# Patient Record
Sex: Female | Born: 1945 | Race: White | Hispanic: No | Marital: Married | Smoking: Former smoker
Health system: Southern US, Community
[De-identification: ages and names within clinical notes are randomized; demographics above are authoritative.]

## PROBLEM LIST (undated history)

## (undated) DIAGNOSIS — M1990 Unspecified osteoarthritis, unspecified site: Secondary | ICD-10-CM

## (undated) DIAGNOSIS — I10 Essential (primary) hypertension: Secondary | ICD-10-CM

## (undated) DIAGNOSIS — G629 Polyneuropathy, unspecified: Secondary | ICD-10-CM

## (undated) DIAGNOSIS — I509 Heart failure, unspecified: Secondary | ICD-10-CM

## (undated) DIAGNOSIS — N289 Disorder of kidney and ureter, unspecified: Secondary | ICD-10-CM

## (undated) DIAGNOSIS — E079 Disorder of thyroid, unspecified: Secondary | ICD-10-CM

## (undated) DIAGNOSIS — D649 Anemia, unspecified: Secondary | ICD-10-CM

## (undated) DIAGNOSIS — H3530 Unspecified macular degeneration: Secondary | ICD-10-CM

## (undated) DIAGNOSIS — R9439 Abnormal result of other cardiovascular function study: Secondary | ICD-10-CM

## (undated) DIAGNOSIS — E785 Hyperlipidemia, unspecified: Secondary | ICD-10-CM

## (undated) DIAGNOSIS — E119 Type 2 diabetes mellitus without complications: Secondary | ICD-10-CM

## (undated) DIAGNOSIS — G4730 Sleep apnea, unspecified: Secondary | ICD-10-CM

## (undated) DIAGNOSIS — T884XXA Failed or difficult intubation, initial encounter: Secondary | ICD-10-CM

## (undated) DIAGNOSIS — E669 Obesity, unspecified: Secondary | ICD-10-CM

## (undated) DIAGNOSIS — J449 Chronic obstructive pulmonary disease, unspecified: Secondary | ICD-10-CM

## (undated) DIAGNOSIS — R079 Chest pain, unspecified: Secondary | ICD-10-CM

## (undated) DIAGNOSIS — I671 Cerebral aneurysm, nonruptured: Secondary | ICD-10-CM

## (undated) DIAGNOSIS — E46 Unspecified protein-calorie malnutrition: Secondary | ICD-10-CM

## (undated) DIAGNOSIS — Z9889 Other specified postprocedural states: Secondary | ICD-10-CM

## (undated) HISTORY — PX: CHOLECYSTECTOMY: SHX55

## (undated) HISTORY — DX: Other specified postprocedural states: Z98.890

## (undated) HISTORY — PX: TONSILLECTOMY: SUR1361

## (undated) HISTORY — DX: Unspecified macular degeneration: H35.30

## (undated) HISTORY — DX: Hyperlipidemia, unspecified: E78.5

## (undated) HISTORY — PX: OTHER SURGICAL HISTORY: SHX169

## (undated) HISTORY — DX: Cerebral aneurysm, nonruptured: I67.1

## (undated) HISTORY — DX: Obesity, unspecified: E66.9

## (undated) HISTORY — DX: Essential (primary) hypertension: I10

## (undated) HISTORY — PX: APPENDECTOMY: SHX54

## (undated) HISTORY — DX: Unspecified osteoarthritis, unspecified site: M19.90

## (undated) HISTORY — DX: Anemia, unspecified: D64.9

## (undated) HISTORY — DX: Abnormal result of other cardiovascular function study: R94.39

## (undated) HISTORY — DX: Disorder of thyroid, unspecified: E07.9

## (undated) HISTORY — DX: Heart failure, unspecified: I50.9

## (undated) HISTORY — DX: Chest pain, unspecified: R07.9

## (undated) HISTORY — DX: Polyneuropathy, unspecified: G62.9

## (undated) HISTORY — PX: CEREBRAL ANEURYSM REPAIR: SHX164

---

## 1968-06-02 HISTORY — PX: BACK SURGERY: SHX140

## 1998-11-21 ENCOUNTER — Inpatient Hospital Stay (HOSPITAL_COMMUNITY): Admission: AD | Admit: 1998-11-21 | Discharge: 1998-11-23 | Payer: Self-pay | Admitting: Internal Medicine

## 2000-02-26 ENCOUNTER — Ambulatory Visit (HOSPITAL_BASED_OUTPATIENT_CLINIC_OR_DEPARTMENT_OTHER): Admission: RE | Admit: 2000-02-26 | Discharge: 2000-02-26 | Payer: Self-pay | Admitting: *Deleted

## 2000-11-26 ENCOUNTER — Emergency Department (HOSPITAL_COMMUNITY): Admission: EM | Admit: 2000-11-26 | Discharge: 2000-11-26 | Payer: Self-pay | Admitting: Internal Medicine

## 2000-11-26 ENCOUNTER — Encounter: Payer: Self-pay | Admitting: *Deleted

## 2001-05-20 ENCOUNTER — Encounter: Payer: Self-pay | Admitting: General Surgery

## 2001-05-20 ENCOUNTER — Ambulatory Visit (HOSPITAL_COMMUNITY): Admission: RE | Admit: 2001-05-20 | Discharge: 2001-05-20 | Payer: Self-pay | Admitting: General Surgery

## 2002-06-02 DIAGNOSIS — I671 Cerebral aneurysm, nonruptured: Secondary | ICD-10-CM

## 2002-06-02 HISTORY — DX: Cerebral aneurysm, nonruptured: I67.1

## 2002-06-02 HISTORY — PX: CARDIAC CATHETERIZATION: SHX172

## 2002-07-19 ENCOUNTER — Encounter: Payer: Self-pay | Admitting: Internal Medicine

## 2002-07-19 ENCOUNTER — Inpatient Hospital Stay (HOSPITAL_COMMUNITY): Admission: EM | Admit: 2002-07-19 | Discharge: 2002-07-28 | Payer: Self-pay | Admitting: Emergency Medicine

## 2002-08-16 ENCOUNTER — Encounter: Admission: RE | Admit: 2002-08-16 | Discharge: 2002-08-16 | Payer: Self-pay | Admitting: Neurosurgery

## 2002-09-29 ENCOUNTER — Encounter: Payer: Self-pay | Admitting: Family Medicine

## 2002-09-29 ENCOUNTER — Ambulatory Visit (HOSPITAL_COMMUNITY): Admission: RE | Admit: 2002-09-29 | Discharge: 2002-09-29 | Payer: Self-pay | Admitting: Family Medicine

## 2002-11-14 ENCOUNTER — Encounter: Payer: Self-pay | Admitting: *Deleted

## 2002-11-14 ENCOUNTER — Inpatient Hospital Stay (HOSPITAL_COMMUNITY): Admission: EM | Admit: 2002-11-14 | Discharge: 2002-11-17 | Payer: Self-pay | Admitting: *Deleted

## 2002-11-15 ENCOUNTER — Encounter: Payer: Self-pay | Admitting: *Deleted

## 2002-11-16 HISTORY — PX: CARDIAC CATHETERIZATION: SHX172

## 2002-12-05 ENCOUNTER — Emergency Department (HOSPITAL_COMMUNITY): Admission: EM | Admit: 2002-12-05 | Discharge: 2002-12-05 | Payer: Self-pay | Admitting: Emergency Medicine

## 2003-02-09 ENCOUNTER — Encounter: Payer: Self-pay | Admitting: Podiatry

## 2003-02-13 ENCOUNTER — Ambulatory Visit (HOSPITAL_COMMUNITY): Admission: RE | Admit: 2003-02-13 | Discharge: 2003-02-13 | Payer: Self-pay | Admitting: Podiatry

## 2003-03-30 ENCOUNTER — Ambulatory Visit (HOSPITAL_COMMUNITY): Admission: RE | Admit: 2003-03-30 | Discharge: 2003-03-31 | Payer: Self-pay | Admitting: Cardiovascular Disease

## 2003-04-17 ENCOUNTER — Ambulatory Visit (HOSPITAL_COMMUNITY): Admission: RE | Admit: 2003-04-17 | Discharge: 2003-04-17 | Payer: Self-pay | Admitting: Cardiovascular Disease

## 2003-05-08 ENCOUNTER — Ambulatory Visit (HOSPITAL_COMMUNITY): Admission: RE | Admit: 2003-05-08 | Discharge: 2003-05-09 | Payer: Self-pay | Admitting: Cardiovascular Disease

## 2004-01-04 ENCOUNTER — Ambulatory Visit (HOSPITAL_COMMUNITY): Admission: RE | Admit: 2004-01-04 | Discharge: 2004-01-04 | Payer: Self-pay | Admitting: Internal Medicine

## 2004-01-05 ENCOUNTER — Ambulatory Visit (HOSPITAL_COMMUNITY): Admission: RE | Admit: 2004-01-05 | Discharge: 2004-01-05 | Payer: Self-pay | Admitting: Interventional Radiology

## 2004-01-30 ENCOUNTER — Ambulatory Visit (HOSPITAL_COMMUNITY): Admission: RE | Admit: 2004-01-30 | Discharge: 2004-01-30 | Payer: Self-pay | Admitting: Internal Medicine

## 2004-03-04 ENCOUNTER — Ambulatory Visit (HOSPITAL_COMMUNITY): Admission: RE | Admit: 2004-03-04 | Discharge: 2004-03-04 | Payer: Self-pay | Admitting: General Surgery

## 2005-01-13 ENCOUNTER — Ambulatory Visit (HOSPITAL_COMMUNITY): Admission: RE | Admit: 2005-01-13 | Discharge: 2005-01-13 | Payer: Self-pay | Admitting: Interventional Radiology

## 2005-03-03 ENCOUNTER — Ambulatory Visit (HOSPITAL_COMMUNITY): Admission: RE | Admit: 2005-03-03 | Discharge: 2005-03-03 | Payer: Self-pay | Admitting: Internal Medicine

## 2005-06-02 HISTORY — PX: ESOPHAGOGASTRODUODENOSCOPY: SHX1529

## 2005-07-04 ENCOUNTER — Ambulatory Visit (HOSPITAL_COMMUNITY): Admission: RE | Admit: 2005-07-04 | Discharge: 2005-07-04 | Payer: Self-pay | Admitting: Interventional Radiology

## 2005-08-27 ENCOUNTER — Ambulatory Visit (HOSPITAL_COMMUNITY): Admission: RE | Admit: 2005-08-27 | Discharge: 2005-08-27 | Payer: Self-pay | Admitting: Family Medicine

## 2005-10-20 ENCOUNTER — Emergency Department (HOSPITAL_COMMUNITY): Admission: EM | Admit: 2005-10-20 | Discharge: 2005-10-20 | Payer: Self-pay | Admitting: Emergency Medicine

## 2005-11-27 ENCOUNTER — Ambulatory Visit: Payer: Self-pay | Admitting: Internal Medicine

## 2005-12-16 ENCOUNTER — Ambulatory Visit (HOSPITAL_COMMUNITY): Admission: RE | Admit: 2005-12-16 | Discharge: 2005-12-16 | Payer: Self-pay | Admitting: Internal Medicine

## 2005-12-16 ENCOUNTER — Ambulatory Visit: Payer: Self-pay | Admitting: Internal Medicine

## 2005-12-16 ENCOUNTER — Encounter (INDEPENDENT_AMBULATORY_CARE_PROVIDER_SITE_OTHER): Payer: Self-pay | Admitting: *Deleted

## 2006-03-04 ENCOUNTER — Ambulatory Visit (HOSPITAL_COMMUNITY): Admission: RE | Admit: 2006-03-04 | Discharge: 2006-03-04 | Payer: Self-pay | Admitting: Internal Medicine

## 2006-04-17 ENCOUNTER — Ambulatory Visit (HOSPITAL_COMMUNITY): Admission: RE | Admit: 2006-04-17 | Discharge: 2006-04-17 | Payer: Self-pay | Admitting: Interventional Radiology

## 2006-05-04 ENCOUNTER — Ambulatory Visit (HOSPITAL_COMMUNITY): Admission: RE | Admit: 2006-05-04 | Discharge: 2006-05-04 | Payer: Self-pay | Admitting: Cardiovascular Disease

## 2006-05-07 ENCOUNTER — Ambulatory Visit (HOSPITAL_COMMUNITY): Admission: RE | Admit: 2006-05-07 | Discharge: 2006-05-07 | Payer: Self-pay | Admitting: Cardiology

## 2006-05-07 HISTORY — PX: OTHER SURGICAL HISTORY: SHX169

## 2007-04-02 ENCOUNTER — Ambulatory Visit (HOSPITAL_COMMUNITY): Admission: RE | Admit: 2007-04-02 | Discharge: 2007-04-02 | Payer: Self-pay | Admitting: Internal Medicine

## 2007-07-16 IMAGING — XA IR ANGIO/CAROTID/CERV BI
1 series · 15 of 24 positions shown · IV contrast (omnipaque)
Comparison: none

CLINICAL DATA: Patient with a previous history of ruptured posterior communicating artery aneurysm with subarachnoid hemorrhage.  Status post endovascular treatment. 
FOUR-VESSEL CEREBRAL ARTERIOGRAM:
Following a full explanation of the procedure, along with the potentially associated complications, an informed witnessed consent was obtained.
The right groin was prepped and draped in the usual sterile fashion.  Thereafter, using a modified Seldinger technique, transfemoral access into the right common femoral artery was obtained without difficulty.  Over a 0.035-inch guidewire, a 5-French Pinnacle sheath was inserted.  Through this and also over a 0.035-inch guidewire, a 5-French JB-1 catheter was advanced through the aortic arch region and selectively positioned in the right common carotid artery, the right vertebral artery, the left common carotid artery, and the left vertebral artery.  There were no acute complications.  The patient tolerated the procedure well. 
Medications utilized:  Versed 1 mg and Fentanyl 25 micrograms IV.
Contrast utilized:  Omnipaque 300, approximately 65 cc.

[Series 1: run · 15 of 112 slices shown]
[im 1/112]
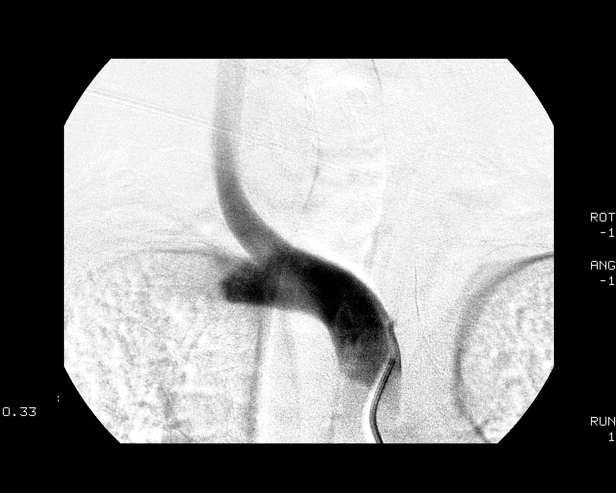
[im 10/112]
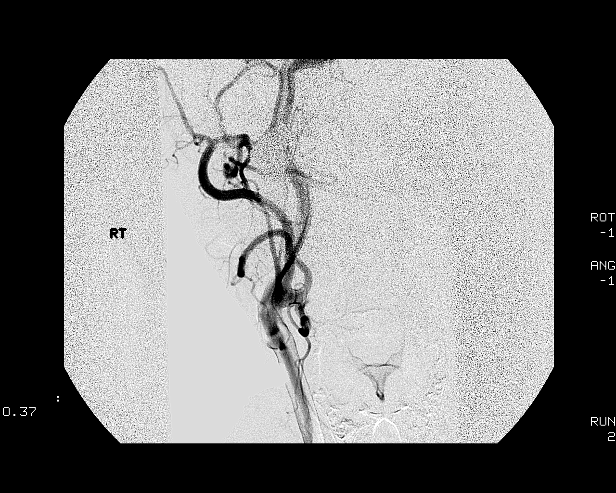
[im 20/112]
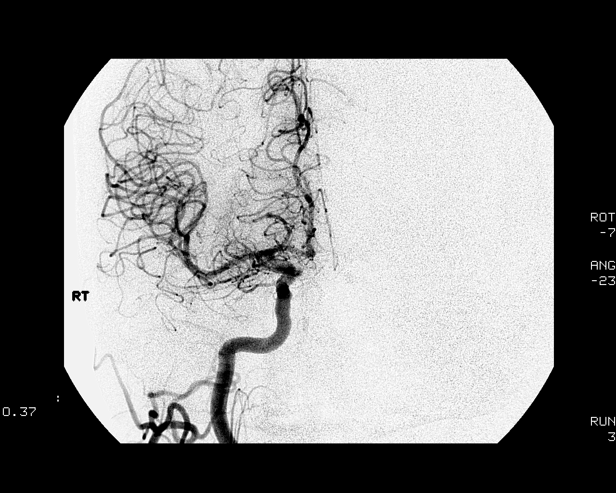
[im 25/112]
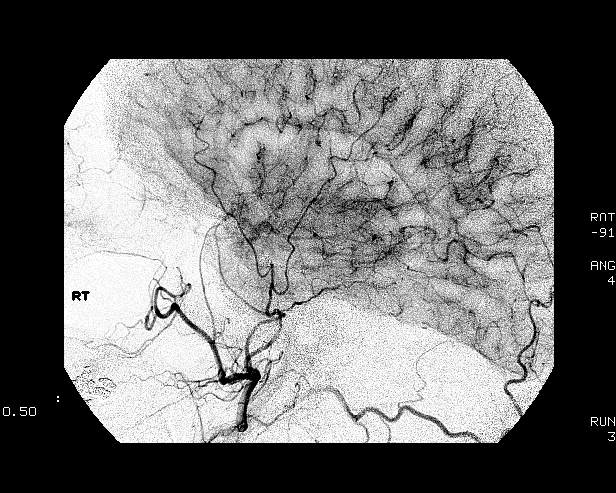
[im 34/112]
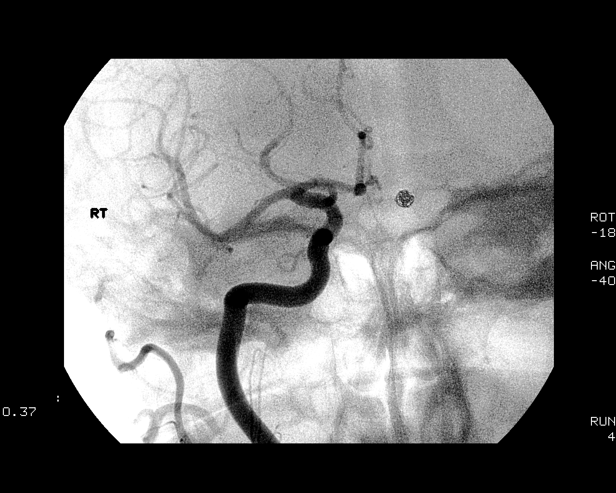
[im 39/112]
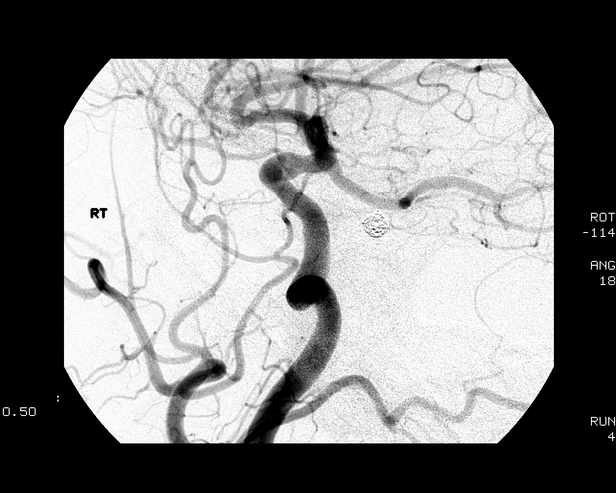
[im 49/112]
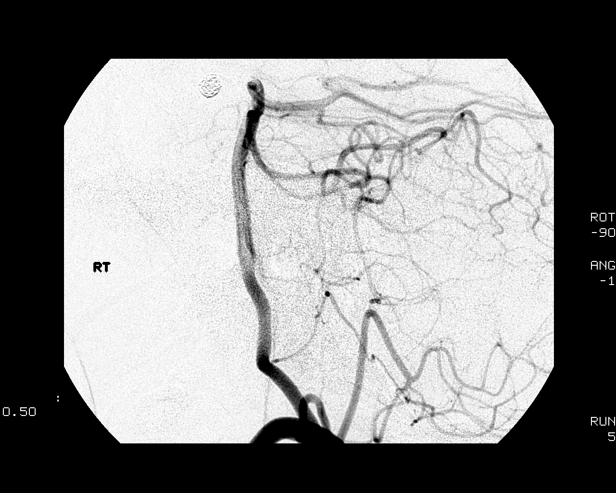
[im 58/112]
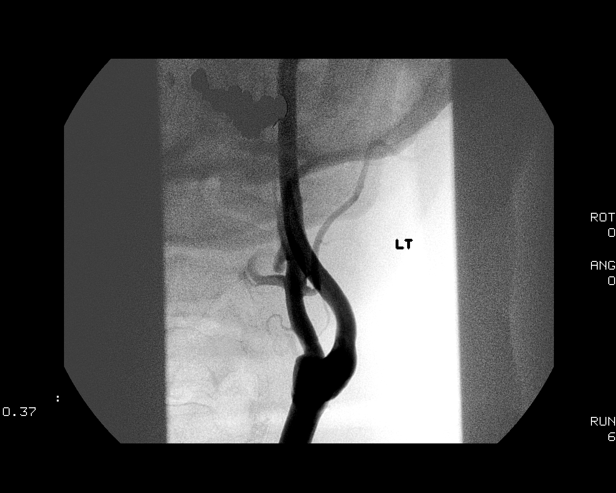
[im 63/112]
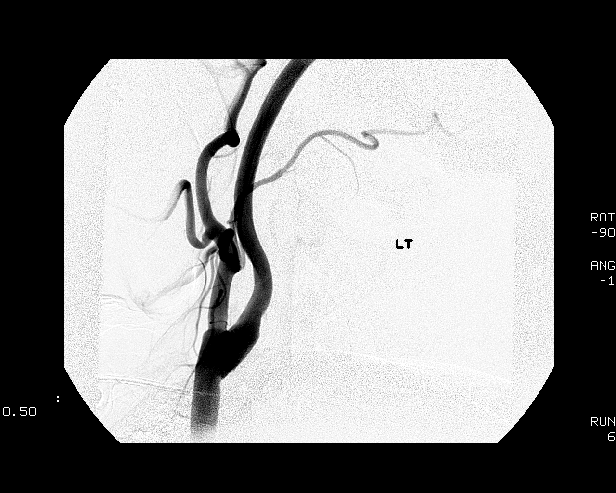
[im 73/112]
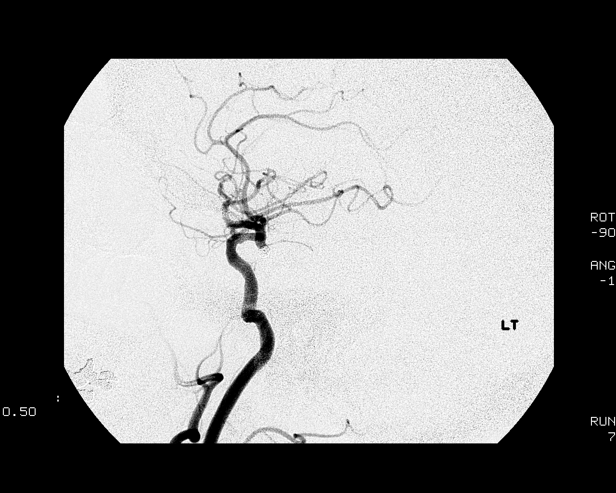
[im 78/112]
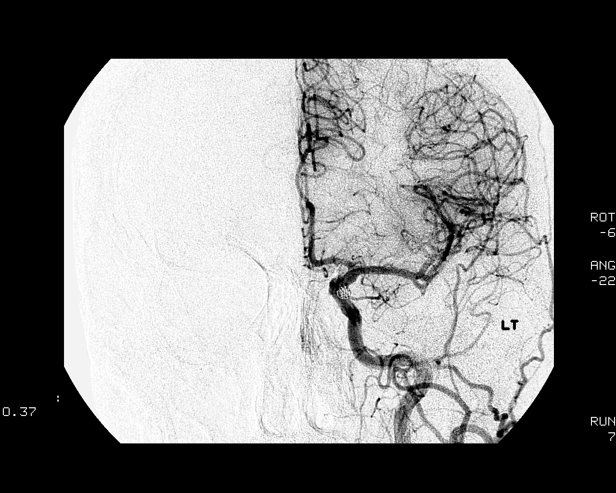
[im 87/112]
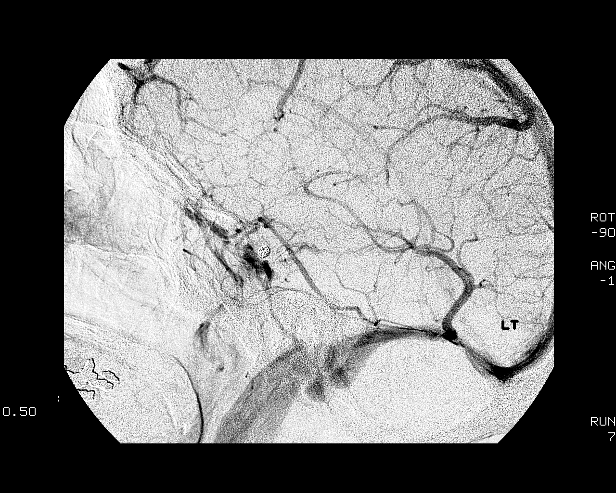
[im 97/112]
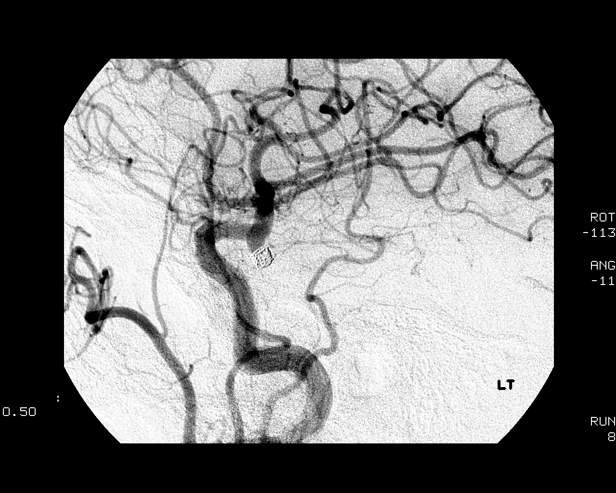
[im 102/112]
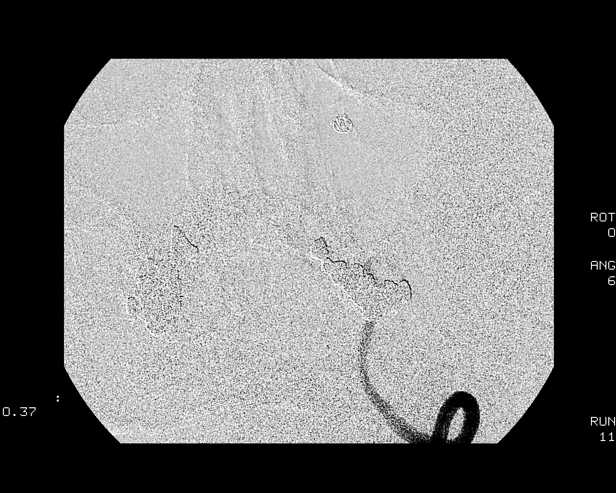
[im 112/112]
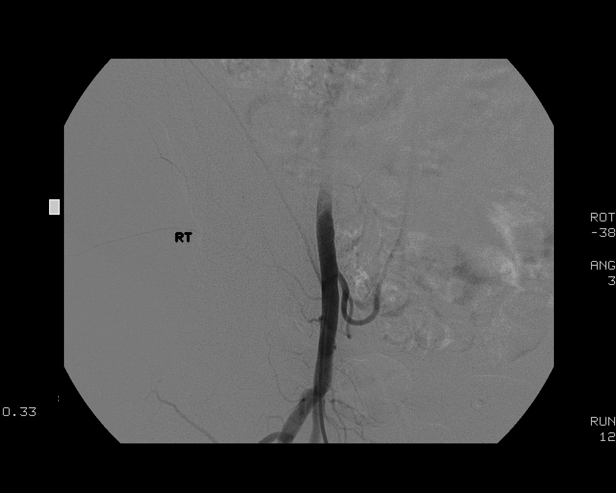

[15 of 24 positions shown; findings below may reference images not displayed]

FINDINGS: The right common carotid arteriogram demonstrates minimal narrowing at the origin of the right external carotid artery.  Its branches are normally opacified. 
The right internal carotid artery at the bulb has mild smooth atherosclerotic plaque.  No stenosis is associated with this.  The vessel otherwise ascends normally to the cranial skull base.  The petrous, the cavernous, and the supraclinoid segments are normal.  There is a dominant right PCOM which opacifies the right PCA and the right superior cerebellar artery distributions.
The right middle and the right anterior cerebral arteries opacify normally into capillary and venous phases.  Transient opacification of the ACOM is noted and is unremarkable. 
The right vertebral artery origin is normal.  The vessel ascends normally to the cranial skull base.  There is normal opacification of the right posterior inferior cerebellar artery and the right vertebrobasilar junction.  The basilar artery, the left posterior cerebral arteries, the superior cerebellar arteries, and the anterior inferior cerebellar arteries opacify normally into capillary and venous phases.
Unopacified blood is noted in the basilar artery from the contralateral vertebral artery. 
The left common carotid arteriogram demonstrates the left external carotid artery origin and branches to be normal.  The left internal carotid artery at the bulb is normal.  Just distal to the bulb, there is a mild, smooth plaque. The vessel otherwise ascends normally to the cranial skull base.  The petrous, the cavernous, and the supraclinoid segments are normal.  In the left PCOM region again demonstrated is the previously endovascularly treated mass of coils without evidence of recanalization or coil compaction.  Again demonstrated is a 2-mm probable infundibulum versus a small neck remnant.  This has remained stable over the past two years. 
The left middle and left anterior cerebral arteries opacify normally into capillary and venous phases.
The left vertebral artery origin is from the aortic arch between the origins of the left subclavian and the left common carotid arteries.  The vessel otherwise ascends normally to the cranial skull base.  There is normal opacification of the left posterior inferior cerebellar artery.   The left vertebrobasilar junction, the basilar artery, the left posterior cerebral arteries, the superior cerebellar arteries, and the anterior inferior cerebellar arteries opacify normally into capillary and venous phases.  Unopacified blood is noted in the basilar artery from the contralateral vertebral artery.
IMPRESSION: 1.  No evidence of coil compaction or of recanalization in the endovascularly treated mass of coils. 
2.  Less than 2-mm infundibulum versus neck remnant, stable for two years. 
3.  No other intracranial aneurysm identified. 
ADDENDUM:
The patient remains asymptomatic.  The patient also denies smoking and has her hypertension and her diabetes under good control. 
In view of the stability of the treatment, it was decided to perform an MRI/MRA of the brain in 6-8 months.  However, should there be changes at that time, or if the patient becomes symptomatic, another catheter angiogram may be necessary.  This has been explained to the patient and her spouse.

## 2008-03-14 ENCOUNTER — Ambulatory Visit (HOSPITAL_COMMUNITY): Admission: RE | Admit: 2008-03-14 | Discharge: 2008-03-14 | Payer: Self-pay | Admitting: General Surgery

## 2008-06-07 ENCOUNTER — Ambulatory Visit (HOSPITAL_COMMUNITY): Admission: RE | Admit: 2008-06-07 | Discharge: 2008-06-07 | Payer: Self-pay | Admitting: Internal Medicine

## 2008-11-17 ENCOUNTER — Inpatient Hospital Stay (HOSPITAL_COMMUNITY): Admission: EM | Admit: 2008-11-17 | Discharge: 2008-11-26 | Payer: Self-pay | Admitting: Emergency Medicine

## 2009-10-23 ENCOUNTER — Encounter: Payer: Self-pay | Admitting: Orthopedic Surgery

## 2009-10-23 ENCOUNTER — Ambulatory Visit (HOSPITAL_COMMUNITY): Admission: RE | Admit: 2009-10-23 | Discharge: 2009-10-23 | Payer: Self-pay | Admitting: Internal Medicine

## 2009-11-21 ENCOUNTER — Encounter: Payer: Self-pay | Admitting: Orthopedic Surgery

## 2009-11-28 ENCOUNTER — Ambulatory Visit: Payer: Self-pay | Admitting: Orthopedic Surgery

## 2009-11-28 DIAGNOSIS — M25569 Pain in unspecified knee: Secondary | ICD-10-CM

## 2009-11-28 DIAGNOSIS — M129 Arthropathy, unspecified: Secondary | ICD-10-CM

## 2009-11-28 DIAGNOSIS — IMO0002 Reserved for concepts with insufficient information to code with codable children: Secondary | ICD-10-CM | POA: Insufficient documentation

## 2009-11-28 DIAGNOSIS — M1710 Unilateral primary osteoarthritis, unspecified knee: Secondary | ICD-10-CM | POA: Insufficient documentation

## 2009-12-05 ENCOUNTER — Encounter (HOSPITAL_COMMUNITY): Admission: RE | Admit: 2009-12-05 | Discharge: 2010-01-04 | Payer: Self-pay | Admitting: Orthopedic Surgery

## 2010-01-30 ENCOUNTER — Ambulatory Visit (HOSPITAL_COMMUNITY): Admission: RE | Admit: 2010-01-30 | Discharge: 2010-01-30 | Payer: Self-pay | Admitting: Internal Medicine

## 2010-02-22 ENCOUNTER — Encounter: Payer: Self-pay | Admitting: Orthopedic Surgery

## 2010-07-04 NOTE — Miscellaneous (Signed)
Summary: Discharged from PT  Discharged from PT   Imported By: Jacklynn Ganong 02/22/2010 08:43:37  _____________________________________________________________________  External Attachment:    Type:   Image     Comment:   External Document

## 2010-07-04 NOTE — Letter (Signed)
Summary: History form  History form   Imported By: Jacklynn Ganong 11/29/2009 14:37:23  _____________________________________________________________________  External Attachment:    Type:   Image     Comment:   External Document

## 2010-07-04 NOTE — Assessment & Plan Note (Signed)
Summary: BI KNEE PAIN XR AT [facility] 10/24/42/[facility]USCO/BSF   Vital Signs:  Patient profile:   65 year old female Height:      71 inches Weight:      332 pounds Pulse rate:   82 / minute Resp:     18 per minute  Vitals Entered By: Fuller Canada MD (November 28, 2009 3:37 PM)  Visit Type:  new patient Referring Provider:  Dr. Sherwood Gambler Primary Provider:  Dr. Sherwood Gambler  CC:  bilateral knee pain.  History of Present Illness: I saw Samantha [name] in the office today for an initial visit.  She is a 65 years old woman with the complaint of:  bilateral knee pain.  Xrays [facility] bilateral knees 10/23/09.  Meds: to scan, too many to list.  The patient complains of stiffness swelling and pain in both knees RIGHT worse in LEFT status post arthroscopy RIGHT knee in 2003 her pain varies from a 5 to a 10.  She primarily has anterior knee pain and complains of pain going up and down the stairs and getting out of a seated position.  She had an IM shot of cortisone which helped a little bit.  It's better she's not active is worse if she is bending her knees or standing for long periods of time or negotiating stairs.  Pain came on gradually.  Current medications include Lorcet 5 500 for neuropathy but at the not help her knees     Allergies (verified): 1)  ! Augmentin 2)  ! * Metronidazole 3)  ! * Clarithromycin 4)  ! Prevacid 5)  ! Levaquin 6)  ! Sulfa 7)  ! * Dst  Past History:  Past Medical History: CHF Diabetes HTN Charcots disease Neuropathy of feet and hands anemia thyroid   Past Surgical History: tonsils appendix lower back heel spurs hammer toes rt knee sark brain aneurysm  Family History: Family History of Diabetes Family History of Arthritis  Social History: Patient is married.  disabled no smoking no alcohol no caffeine 12th grade ed.  Review of Systems Constitutional:  Denies weight loss, weight gain, fever, chills, and fatigue. Cardiovascular:  Denies chest pain,  palpitations, fainting, and murmurs. Respiratory:  Denies short of breath, wheezing, couch, tightness, pain on inspiration, and snoring . Gastrointestinal:  Complains of heartburn; denies nausea, vomiting, diarrhea, constipation, and blood in your stools. Genitourinary:  Complains of frequency; denies urgency, difficulty urinating, painful urination, flank pain, and bleeding in urine. Neurologic:  Denies numbness, tingling, unsteady gait, dizziness, tremors, and seizure. Musculoskeletal:  Complains of swelling and instability; denies joint pain, stiffness, redness, heat, and muscle pain. Endocrine:  Denies excessive thirst, exessive urination, and heat or cold intolerance. Psychiatric:  Denies nervousness, depression, anxiety, and hallucinations. Skin:  Denies changes in the skin, poor healing, rash, itching, and redness. HEENT:  Denies blurred or double vision, eye pain, redness, and watering. Immunology:  Complains of seasonal allergies; denies sinus problems and allergic to bee stings. Hemoatologic:  Complains of easy bleeding and brusing.  Physical Exam  Additional Exam:  large female vital signs as recorded no gross deformities well-groomed  Cardiovascular exam observation and palpation were normal  Lymph nodes were negative in the groin  Skin is warm dry and intact times to extremities  She had normal reflexes normal sensation she was awake and alert and oriented x3  Her gait was normal  She had crepitance on range of motion of both knees she had tenderness medially which was mild.  She maintained her  range of motion had grade 5 motor strength in her knees were stable she had a negative McMurray sign   Impression & Recommendations:  Problem # 1:  KNEE, ARTHRITIS, DEGEN./OSTEO (ICD-715.96) x-rays were done at [facility] and they show mild arthritis in both knee joints.  There was not a patellofemoral view and most of her symptoms seem to be coming from the patellofemoral joint.  She  has patellofemoral arthritis and mild tibiofemoral arthritis  Recommendations physical therapy program as well as bilateral knee injections and follow up as needed no surgery recommended at this time  Other Orders: Physical Therapy Referral (PT) New Patient Level III (57846) Joint Aspirate / Injection, Large (20610) Depo- Medrol 40mg  (J1030)  Patient Instructions: 1)  You have received an injection of cortisone today. You may experience increased pain at the injection site. Apply ice pack to the area for 20 minutes every 2 hours and take 2 xtra strength tylenol every 8 hours. This increased pain will usually resolve in 24 hours. The injection will take effect in 3-10 days.  2)  Physical Thearpy  3)  Please schedule a follow-up appointment as needed.

## 2010-09-09 LAB — GLUCOSE, CAPILLARY
Glucose-Capillary: 100 mg/dL — ABNORMAL HIGH (ref 70–99)
Glucose-Capillary: 105 mg/dL — ABNORMAL HIGH (ref 70–99)
Glucose-Capillary: 117 mg/dL — ABNORMAL HIGH (ref 70–99)
Glucose-Capillary: 122 mg/dL — ABNORMAL HIGH (ref 70–99)
Glucose-Capillary: 131 mg/dL — ABNORMAL HIGH (ref 70–99)
Glucose-Capillary: 133 mg/dL — ABNORMAL HIGH (ref 70–99)
Glucose-Capillary: 134 mg/dL — ABNORMAL HIGH (ref 70–99)
Glucose-Capillary: 139 mg/dL — ABNORMAL HIGH (ref 70–99)
Glucose-Capillary: 139 mg/dL — ABNORMAL HIGH (ref 70–99)
Glucose-Capillary: 140 mg/dL — ABNORMAL HIGH (ref 70–99)
Glucose-Capillary: 143 mg/dL — ABNORMAL HIGH (ref 70–99)
Glucose-Capillary: 145 mg/dL — ABNORMAL HIGH (ref 70–99)
Glucose-Capillary: 148 mg/dL — ABNORMAL HIGH (ref 70–99)
Glucose-Capillary: 150 mg/dL — ABNORMAL HIGH (ref 70–99)
Glucose-Capillary: 156 mg/dL — ABNORMAL HIGH (ref 70–99)
Glucose-Capillary: 156 mg/dL — ABNORMAL HIGH (ref 70–99)
Glucose-Capillary: 160 mg/dL — ABNORMAL HIGH (ref 70–99)
Glucose-Capillary: 165 mg/dL — ABNORMAL HIGH (ref 70–99)
Glucose-Capillary: 78 mg/dL (ref 70–99)
Glucose-Capillary: 82 mg/dL (ref 70–99)

## 2010-09-09 LAB — BASIC METABOLIC PANEL
BUN: 12 mg/dL (ref 6–23)
BUN: 14 mg/dL (ref 6–23)
BUN: 5 mg/dL — ABNORMAL LOW (ref 6–23)
BUN: 6 mg/dL (ref 6–23)
BUN: 9 mg/dL (ref 6–23)
CO2: 24 mEq/L (ref 19–32)
CO2: 27 mEq/L (ref 19–32)
CO2: 27 mEq/L (ref 19–32)
CO2: 27 mEq/L (ref 19–32)
CO2: 29 mEq/L (ref 19–32)
CO2: 29 mEq/L (ref 19–32)
Calcium: 8 mg/dL — ABNORMAL LOW (ref 8.4–10.5)
Calcium: 8 mg/dL — ABNORMAL LOW (ref 8.4–10.5)
Calcium: 8.3 mg/dL — ABNORMAL LOW (ref 8.4–10.5)
Calcium: 8.4 mg/dL (ref 8.4–10.5)
Calcium: 8.5 mg/dL (ref 8.4–10.5)
Calcium: 8.7 mg/dL (ref 8.4–10.5)
Chloride: 105 mEq/L (ref 96–112)
Chloride: 107 mEq/L (ref 96–112)
Chloride: 108 mEq/L (ref 96–112)
Creatinine, Ser: 0.75 mg/dL (ref 0.4–1.2)
Creatinine, Ser: 0.8 mg/dL (ref 0.4–1.2)
Creatinine, Ser: 0.81 mg/dL (ref 0.4–1.2)
Creatinine, Ser: 0.91 mg/dL (ref 0.4–1.2)
GFR calc Af Amer: >60 mL/min (ref >60)
GFR calc Af Amer: >60 mL/min (ref >60)
GFR calc non Af Amer: 57 mL/min — ABNORMAL LOW (ref >60)
GFR calc non Af Amer: >60 mL/min (ref >60)
GFR calc non Af Amer: >60 mL/min (ref >60)
GFR calc non Af Amer: >60 mL/min (ref >60)
Glucose, Bld: 105 mg/dL — ABNORMAL HIGH (ref 70–99)
Glucose, Bld: 118 mg/dL — ABNORMAL HIGH (ref 70–99)
Glucose, Bld: 144 mg/dL — ABNORMAL HIGH (ref 70–99)
Glucose, Bld: 164 mg/dL — ABNORMAL HIGH (ref 70–99)
Glucose, Bld: 84 mg/dL (ref 70–99)
Potassium: 3.2 mEq/L — ABNORMAL LOW (ref 3.5–5.1)
Potassium: 4 mEq/L (ref 3.5–5.1)
Sodium: 136 mEq/L (ref 135–145)
Sodium: 138 mEq/L (ref 135–145)
Sodium: 138 mEq/L (ref 135–145)
Sodium: 139 mEq/L (ref 135–145)

## 2010-09-09 LAB — CBC
HCT: 30.5 % — ABNORMAL LOW (ref 36.0–46.0)
Hemoglobin: 10.4 g/dL — ABNORMAL LOW (ref 12.0–15.0)
Hemoglobin: 8.9 g/dL — ABNORMAL LOW (ref 12.0–15.0)
Hemoglobin: 9 g/dL — ABNORMAL LOW (ref 12.0–15.0)
MCHC: 33.9 g/dL (ref 30.0–36.0)
MCHC: 34 g/dL (ref 30.0–36.0)
MCHC: 34.2 g/dL (ref 30.0–36.0)
MCHC: 34.2 g/dL (ref 30.0–36.0)
MCHC: 34.2 g/dL (ref 30.0–36.0)
MCHC: 34.7 g/dL (ref 30.0–36.0)
MCV: 83.1 fL (ref 78.0–100.0)
MCV: 83.2 fL (ref 78.0–100.0)
MCV: 83.6 fL (ref 78.0–100.0)
Platelets: 146 10*3/uL — ABNORMAL LOW (ref 150–400)
Platelets: 171 10*3/uL (ref 150–400)
Platelets: 175 10*3/uL (ref 150–400)
Platelets: 362 10*3/uL (ref 150–400)
RBC: 3.15 MIL/uL — ABNORMAL LOW (ref 3.87–5.11)
RBC: 3.4 MIL/uL — ABNORMAL LOW (ref 3.87–5.11)
RBC: 3.43 MIL/uL — ABNORMAL LOW (ref 3.87–5.11)
RBC: 3.6 MIL/uL — ABNORMAL LOW (ref 3.87–5.11)
RDW: 17.3 % — ABNORMAL HIGH (ref 11.5–15.5)
RDW: 17.8 % — ABNORMAL HIGH (ref 11.5–15.5)
RDW: 17.8 % — ABNORMAL HIGH (ref 11.5–15.5)
RDW: 17.9 % — ABNORMAL HIGH (ref 11.5–15.5)
RDW: 17.9 % — ABNORMAL HIGH (ref 11.5–15.5)
RDW: 18 % — ABNORMAL HIGH (ref 11.5–15.5)
RDW: 18 % — ABNORMAL HIGH (ref 11.5–15.5)
WBC: 15.9 10*3/uL — ABNORMAL HIGH (ref 4.0–10.5)
WBC: 7.7 10*3/uL (ref 4.0–10.5)

## 2010-09-09 LAB — COMPREHENSIVE METABOLIC PANEL
ALT: 24 U/L (ref 0–35)
AST: 45 U/L — ABNORMAL HIGH (ref 0–37)
Albumin: 2.3 g/dL — ABNORMAL LOW (ref 3.5–5.2)
Calcium: 8.1 mg/dL — ABNORMAL LOW (ref 8.4–10.5)
GFR calc Af Amer: >60 mL/min (ref >60)
Sodium: 137 mEq/L (ref 135–145)
Total Protein: 5.4 g/dL — ABNORMAL LOW (ref 6.0–8.3)

## 2010-09-09 LAB — DIFFERENTIAL
Basophils Absolute: 0 10*3/uL (ref 0.0–0.1)
Basophils Absolute: 0 10*3/uL (ref 0.0–0.1)
Basophils Absolute: 0 10*3/uL (ref 0.0–0.1)
Basophils Absolute: 0 10*3/uL (ref 0.0–0.1)
Basophils Absolute: 0 10*3/uL (ref 0.0–0.1)
Basophils Absolute: 0 10*3/uL (ref 0.0–0.1)
Basophils Relative: 0 % (ref 0–1)
Basophils Relative: 0 % (ref 0–1)
Basophils Relative: 0 % (ref 0–1)
Basophils Relative: 0 % (ref 0–1)
Basophils Relative: 0 % (ref 0–1)
Basophils Relative: 0 % (ref 0–1)
Eosinophils Absolute: 0 10*3/uL (ref 0.0–0.7)
Eosinophils Absolute: 0 10*3/uL (ref 0.0–0.7)
Eosinophils Absolute: 0.1 10*3/uL (ref 0.0–0.7)
Eosinophils Absolute: 0.1 10*3/uL (ref 0.0–0.7)
Eosinophils Relative: 0 % (ref 0–5)
Eosinophils Relative: 0 % (ref 0–5)
Eosinophils Relative: 1 % (ref 0–5)
Eosinophils Relative: 1 % (ref 0–5)
Lymphocytes Relative: 15 % (ref 12–46)
Lymphocytes Relative: 3 % — ABNORMAL LOW (ref 12–46)
Lymphocytes Relative: 5 % — ABNORMAL LOW (ref 12–46)
Lymphocytes Relative: 9 % — ABNORMAL LOW (ref 12–46)
Lymphs Abs: 0.5 10*3/uL — ABNORMAL LOW (ref 0.7–4.0)
Lymphs Abs: 0.9 10*3/uL (ref 0.7–4.0)
Lymphs Abs: 1.4 10*3/uL (ref 0.7–4.0)
Monocytes Absolute: 0.2 10*3/uL (ref 0.1–1.0)
Monocytes Absolute: 0.4 10*3/uL (ref 0.1–1.0)
Monocytes Absolute: 0.7 10*3/uL (ref 0.1–1.0)
Monocytes Absolute: 0.8 10*3/uL (ref 0.1–1.0)
Monocytes Absolute: 0.8 10*3/uL (ref 0.1–1.0)
Monocytes Relative: 2 % — ABNORMAL LOW (ref 3–12)
Monocytes Relative: 5 % (ref 3–12)
Monocytes Relative: 7 % (ref 3–12)
Monocytes Relative: 8 % (ref 3–12)
Neutro Abs: 10.6 10*3/uL — ABNORMAL HIGH (ref 1.7–7.7)
Neutro Abs: 12.5 10*3/uL — ABNORMAL HIGH (ref 1.7–7.7)
Neutro Abs: 15 10*3/uL — ABNORMAL HIGH (ref 1.7–7.7)
Neutro Abs: 6.9 10*3/uL (ref 1.7–7.7)
Neutro Abs: 7.9 10*3/uL — ABNORMAL HIGH (ref 1.7–7.7)
Neutro Abs: 9 10*3/uL — ABNORMAL HIGH (ref 1.7–7.7)
Neutrophils Relative %: 75 % (ref 43–77)
Neutrophils Relative %: 76 % (ref 43–77)
Neutrophils Relative %: 94 % — ABNORMAL HIGH (ref 43–77)

## 2010-09-09 LAB — STOOL CULTURE

## 2010-09-09 LAB — CULTURE, BLOOD (ROUTINE X 2)
Culture: NO GROWTH
Culture: NO GROWTH
Report Status: 6232010
Report Status: 6232010

## 2010-09-09 LAB — CLOSTRIDIUM DIFFICILE EIA: C difficile Toxins A+B, EIA: NEGATIVE

## 2010-09-09 LAB — URINALYSIS, ROUTINE W REFLEX MICROSCOPIC
Bilirubin Urine: NEGATIVE
Glucose, UA: NEGATIVE mg/dL
Hgb urine dipstick: NEGATIVE
Ketones, ur: NEGATIVE mg/dL
pH: 6 (ref 5.0–8.0)

## 2010-09-09 LAB — FECAL LACTOFERRIN, QUANT

## 2010-09-09 LAB — PROTIME-INR
INR: 1.2 (ref 0.00–1.49)
Prothrombin Time: 15.8 seconds — ABNORMAL HIGH (ref 11.6–15.2)

## 2010-09-09 LAB — HEMOGLOBIN A1C
Hgb A1c MFr Bld: 5.7 % (ref 4.6–6.1)
Mean Plasma Glucose: 117 mg/dL

## 2010-09-09 LAB — MAGNESIUM: Magnesium: 1.6 mg/dL (ref 1.5–2.5)

## 2010-09-09 LAB — APTT: aPTT: 40 seconds — ABNORMAL HIGH (ref 24–37)

## 2010-09-13 ENCOUNTER — Encounter: Payer: Self-pay | Admitting: Orthopedic Surgery

## 2010-09-17 ENCOUNTER — Ambulatory Visit (INDEPENDENT_AMBULATORY_CARE_PROVIDER_SITE_OTHER): Payer: Medicare HMO | Admitting: Orthopedic Surgery

## 2010-09-17 ENCOUNTER — Ambulatory Visit: Payer: Self-pay | Admitting: Orthopedic Surgery

## 2010-09-17 DIAGNOSIS — M1710 Unilateral primary osteoarthritis, unspecified knee: Secondary | ICD-10-CM

## 2010-09-17 MED ORDER — METHYLPREDNISOLONE ACETATE 40 MG/ML IJ SUSP: 40.0000 mg | Freq: Once | INTRAMUSCULAR | Status: DC

## 2010-09-17 MED ORDER — TRAMADOL-ACETAMINOPHEN 37.5-325 MG PO TABS: 1.0000 | ORAL_TABLET | ORAL | Status: DC | PRN

## 2010-09-17 NOTE — Progress Notes (Signed)
Bilateral knee pain RIGHT knee pain worse than LEFT with recent increase in symptoms in the RIGHT knee  65 year old female soon to be 66 with obesity and bilateral knee pain secondary to osteoarthritis presents for reevaluation of the RIGHT knee with increasing pain over the last 6 weeks requiring her to use a cane.  She is currently not on any arthritis medication she does take Prilosec on occasion.  She is currently using a cane over the last 6 weeks no history of trauma recently.  Received injections in the over a year ago or posterior ago and that helped.  She has a history of brain aneurysm congestive heart failure and valvular heart disease.  Recently lost 20 pounds and currently weighs 321 pounds.  She declined surgery for gastric bypass.  Previous surgery includes tonsillectomy appendectomy lower back heel spur hammertoes RIGHT knee arthroscopy correction of brain aneurysm  Medical problems include congestive heart failure diabetes hypertension peripheral neuropathy anemia and thyroid disease  Patient is married and disabled does not smoke or drink alcohol.  Exam normal development drumming and hygiene moderate obesity  Normal pulses and perfusion in both lower extremities  Medial joint line tenderness is noted in the RIGHT knee.  There is no effusion muscle strength and tone are normal.  Knee is stable.  Radiographs were repeated she is now bone to bone she has narrowing of the medial joint space.  We did give her an injection and started her on Ultracet.  However, because of her medical problems and her obesity she is a high-risk surgical candidate for knee replacement surgery and I encouraged her to continue to lose weight.  If she needs surgery she will have to be referred to a tertiary care facility to optimize her outcome.

## 2010-09-17 NOTE — Patient Instructions (Signed)
Continue your excellent progress with weight loss   Take new medication for pain

## 2010-09-17 NOTE — Discharge Summary (Signed)
Separate identifiable x-ray report bilateral x-rays of the knees  Reason pain and osteoarthritis  In the RIGHT knee we see varus with medial joint space narrowing greater than the LEFT.  Both knees have arthritis.  Overall alignment both knees varus.  Impression bilateral moderate arthritis with RIGHT greater than LEFT disease

## 2010-09-27 HISTORY — PX: DOPPLER ECHOCARDIOGRAPHY: SHX263

## 2010-10-15 NOTE — Group Therapy Note (Signed)
NAME:  Samantha [name], Samantha [name]                ACCOUNT NO.:  [id number]   MEDICAL RECORD NO.:  [id number]          PATIENT TYPE:  INP   LOCATION:  A323                          FACILITY:  [facility]   PHYSICIAN:  Melissa L. Ladona Ridgel, MD  DATE OF BIRTH:  08/12/1945   DATE OF PROCEDURE:  11/23/2008  DATE OF DISCHARGE:                                 PROGRESS NOTE   HISTORY OF PRESENT ILLNESS:  The patient is doing quite well today.  She  still has a little bit loose stool, but it is only one, and she is still  feeling of a touch nauseated every now and again.  She says that the leg  is much less painful.  She is weightbearing a little easier and it feels  a little less tight.  She denies any cough or chest pain and no  vomiting.   PHYSICAL EXAMINATION:  VITAL SIGNS:  T-max was 100.4 and that was on  June 22.  This morning, she is 98.2, blood pressure 123/68, pulse 86,  respirations 20, saturation 98%.  Intake and output is incomplete.  She  had documented one stool yesterday.  GENERAL:  This is an obese white female in no acute distress.  HEENT:  She is normocephalic, atraumatic.  Pupils equal, round and  reactive to light.  Extraocular muscles intact.  Membranes are moist.  NECK:  Supple.  There is no JVD.  No lymph nodes.  No carotid bruits.  CHEST:  Clear to auscultation.  There is no wheezes, rales or rhonchi.  CARDIOVASCULAR:  Regular rhythm.  Positive S1, S2.  No S3, S4.  No  murmurs, rubs or gallops.  ABDOMEN:  Soft, nontender, nondistended with positive bowel sounds.  No  hepatosplenomegaly, no guarding or rebound examination.  EXTREMITIES:  Reveal that the left leg cellulitis is starting to regress  from the lines that are marked.  She has less erythema in the groin and  the upper thigh and the central part of the lesion is actually still  fairly foliaceous, but it is resending from the periphery.  I still see  no purulent material to discharge, but she is starting to get wrinkles  over the  area suggesting decrease in edema.  NEUROLOGICAL:  Cranial nerves II-XII are intact.  Strength 5/5.  PSYCHIATRIC:  Affect is appropriate.  She is in good spirits.  She is  tolerating this lengthy hospitalization well.   PERTINENT LABORATORY VALUES:  Stool has no suspicious colonies,  although, she has reduced normal flora.  Sodium is 138, potassium is  3.3, chloride is 107, CO2 27, BUN is 7, creatinine 0.72 and glucose is  115.  CBC:  A white count is 8.8, with hemoglobin of 9.0, hematocrit  26.2 and platelets of 178.   ASSESSMENT/PLAN:  This is a 65 year old white female presented with an  extensive cellulitis of the left lower extremity.  It appeared there may  be a brewing abscess at this time.  There is no collection that is  drainable.  The patient has been switched from vancomycin and Zosyn to  vancomycin and clindamycin  which seems to be working a little bit better  as the cellulitis is regressing, and she is less tender with less  swelling.  1. Cellulitis.  Continue vancomycin and she is currently day #3 of      clindamycin.  2. Cardiomyopathy, currently stable.  3. Gastroenteritis.  Her stool culture appears to have low normal      flora, but no evidence for C.  Difficile, so I will go ahead and      continue the Questran and add some Lactobacillus in the ear pill      form or dietary form.  4. Diabetes.  Currently she is adequately treated with her Actos and      sliding scale.  Will continue that.  5. Hypothyroidism.  She is will begin maintained on her Synthroid.      This is stable.  6. Anemia.  This has been a chronic problem for this patient.  Will      continue to monitor her.  At this time, I do not have any evidence      for heme-positive stools, and she has already had colonoscopy, so      we will just continue to watch.   Total time on this case was 20 minutes.      Melissa L. Ladona Ridgel, MD  Electronically Signed     MLT/MEDQ  D:  11/23/2008  T:   11/23/2008  Job:  [id number]

## 2010-10-15 NOTE — Discharge Summary (Signed)
NAME:  Samantha [name], Samantha [name]                ACCOUNT NO.:  [id number]   MEDICAL RECORD NO.:  [id number]          PATIENT TYPE:  INP   LOCATION:  A323                          FACILITY:  [facility]   PHYSICIAN:  Melissa L. Ladona Ridgel, MD  DATE OF BIRTH:  December 25, 1945   DATE OF ADMISSION:  11/17/2008  DATE OF DISCHARGE:  06/27/2010LH                               DISCHARGE SUMMARY   DISCHARGING DIAGNOSES:  1. Left lower extremity cellulitis with edema, slightly improving but      will continue to treat her at home with intravenous vancomycin and      oral clindamycin.  She has an Radio broadcast assistant in place which I will      request be changed on Wednesday to assure that there is nothing      underlying the boot that needs attention, and then on Sunday and      then weekly as ordered by Dr. Lovell Sheehan.  Please note that if she      continues to have skin changes perhaps a vascular evaluation would      be appropriate as she does have a history of vascular disease in      the right leg.  Will also continue her cilostazol for her      peripheral vascular disease.  Note that the Dopplers of the lower      extremities have remained negative for deep vein thrombosis.  2. Nausea, vomiting and diarrhea.  The patient still intermittently      had a little bit of nausea but that has improved, her stools have      been formed and she has tested negative for Clostridium difficile      colitis.  I will send her home on a Protonix once daily dose until      she is completed with all of her medications for infection.  3. Diabetes.  Her blood sugars have been slightly elevated but not too      bad in the face of infection.  I think once she is at home and more      comfortable and guarding her own diet, as well as resuming her      glipizide, I think that she will go back into a better controlled      status.  4. Cardiomyopathy.  The patient has been clinically stable on her      Coreg and enalapril as well as Lasix.  I have made no  adjustments      to those.  She will also continue her Crestor for hyperlipidemia.  5. Hypothyroidism.  She will continue her Synthroid.  6. Peripheral neuropathy.  The patient will continue her gabapentin      and I provided her with some Lorcet in addition to her Ultram, she      also uses Zostrix cream to help with that.  7. Anemia.  The patient will remain on Ferrocite 325 daily and will be      monitoring her counts while she is on vancomycin.  8. Dyslipidemia.  As stated, the patient will remain on  her Zetia,      Crestor, Niaspan.  9. Leukocytosis.  Throughout the hospital course the patient has had a      slight neutrophilia.  Her total white count, however, had been      within normal limits up until th time that a peripherally-inserted      catheter was placed.  After the peripherally-inserted central      catheter was placed, her white count went up slightly and then rose      again a second time.  The patient, however, has been without fever      and her leg is improving and, therefore, I am not inclined to keep      her in hospital to treat a number.  I will, however, and have      instructed her to report any low-grade fevers, any pain in her arm      or her peripherally inserted central catheter or any sweats.  I      will have the Home Health nurse draw a CBC on Tuesday for follow-      up, to be followed up by Dr. Sherwood Gambler.  If she continues to have      elevations in her white count or a low-grade fever, we may need to      reconsider the antibiotics that she is going to be on.      Unfortunately, she has a lot of allergies to antibiotics and,      therefore, most of the medications I would use are on her allergy      list including a quinolone or Augmentin.  The option would be to      resume her Zosyn, which she was tolerating, but we did discontinue      that in favor of just the vancomycin and clindamycin since both      cover most of the spectrum of bacterial  coverage.   MEDICATIONS AT THE TIME OF DISCHARGE:  1. CoQ10 #1 daily.  2. Ocuvite once daily.  3. Aspirin 81 mg daily.  4. Cilostazol 50 mg at bedtime.  5. Synthroid 100 mcg daily.  6. Gabapentin 100 mg 3 times daily.  7. Lorcet 10/650 one every 4 hours as needed for pain.  8. Actos 30 mg at bedtime.  9. Zetia 10 mg at bedtime.  10.Niaspan 1000 mg at bedtime.  11.Coreg 25 mg twice daily.  12.Lasix 80 mg once daily.  13.Enalapril 10 mg once daily.  14.Folic acid 1 mg once daily.  15.Ferrocite 325 mg once daily.  16.Crestor 20 mg at bedtime.  17.Tramadol 100 mg every 6 hours as needed for pain.  18.Metformin 1000 mg twice daily.  19.Glipizide 20 mg once daily.  20.Clindamycin 450 mg every 6 hours for 10 days.  21.Vancomycin 1.15 grams IV at 9 a.m. and 9 p.m.  22.Protonix 40 mg once daily.   SPECIAL INSTRUCTIONS:  1. The patient should have her first Unna boot change on Wednesday,      that way Home Health can get a look at the leg and then resume      changing it on Sunday, July 4th and thereafter weekly if everything      is okay  2. She should have routine PICC care per Home Health with reference      referral of laboratory values to her primary care physician, Dr.      Sherwood Gambler.  3. The patient is to call Dr. Sherwood Gambler and  make an appointment in 5 days.  4. The patient can call Dr. Lovell Sheehan if she is having any difficulty      with her [employer] boat, as directed by him.   HOSPITAL COURSE:  The patient is a very pleasant 65 year old white  female who presented to the hospital on June 18th with a complaint of  lower extremity redness.  The patient has significant peripheral  arterial disease and was in her usual state of health until she  developed the onset of chills and did not sleep very well.  She  developed nausea, vomiting and subsequently had diarrhea.  She evidently  had a course of Keflex about 2 weeks ago for a toe infection, but now  subsequently developed these findings.   The patient was admitted to the  hospital, she was rehydrated, she was started on vancomycin and Zosyn.  Her leg continued to be swollen, beefy red with extension of the  cellulitic changes up into the groin, she was very tender to touch.  Despite several days antibiotic therapy, she really had no resolution of  these symptoms.  I, therefore, added clindamycin and asked for a  surgical consult to assure there was no areas that needed to be drained.  The patient was found to have no obvious abscesses on ultrasound and Dr.  Lovell Sheehan did not feel that there was any place that needed to be drained  or debrided.  We elected to discontinue her Zosyn in lieu of using  clindamycin and vancomycin to cover what was felt to perhaps be a  streptococcal toxin reaction.  The patient improved on vancomycin and  clindamycin IV with regression of some of the cellulitic changes and  decrease in the tenderness of the leg.  We subsequently placed a PICC  line in the right arm in order to facilitate home therapy with  antibiotics.  The patient was seen and examined by Dr. Lovell Sheehan.  It was  determined that perhaps an Unna boot may be beneficial, therefore that  was placed on the day of discharge November 26, 2008.  The patient on June  26th was noted to have a slightly elevated total WBC count which was  different from previous as she had a neutrophilia but her total count  was within normal limits.  I suspect that there may be an element of  demargination status post PICC placement; however, I explained to her  and to her husband that there would be no way for me to stay that  definitively without continuing to follow her.  I expressed to her that  if she develops any pain in that right arm where the PICC is, redness or  continued fever that she should contact Dr. Sherwood Gambler as we should examine  the PICC to see if this is the source for further infection.  The  patient, however, is not acting infected, she does not  have a fever, she  does not even have a low-grade fever for last 48 hours.  I, therefore,  feel comfortable sending her home since she is going to be having home  nursing there.  She will receive her IV vancomycin and continue oral  clindamycin for 10 days and I will ask them to check a CBC on Tuesday  and send that to Dr. Sherwood Gambler further evaluation.  I did entertain adding a  third oral antibiotic; however, the patient has multiple allergies  including QUINOLONES, BACTRIM and AUGMENTIN, which really limits my  options.  I  would recommend that if she continues to have elevation in  her white count or fever that Zosyn be started, she did tolerate that in  the hospital.   On the day of discharge again, the patient is doing quite well.  She has  no complaints except for an occasional bout with some pain in her leg  which is more from her neuropathy than the swelling.  She has a little  bit of nausea which is relieved easily with medication and she feels  that she can manage at home.  Again, the patient's spouse and the  patient understand that we could have regression in her therapy and she  may need to come back to the hospital, but at this time I feel that we  have all security measures in place and she is competent enough to  evaluate herself with regard to fever, chills or other symptoms that may  suggest that she is having a different type of infective process.  On  the day of discharge, the patient's vital signs:  T-max was 98.3, today  she is 98, blood pressure 143/75, pulse 90, respirations 18, saturation  94%.  She has had 1 formed stool on the 26th.  GENERAL:  This is a morbidly obese white female in no acute distress.  She is normocephalic, atraumatic.  Pupils equal, round, reactive to  light.  Extraocular muscles are intact.  Mucous membranes are moist.  NECK:  Supple.  There is no JVD, no lymph nodes, no carotid bruits.  CHEST:  Decreased but clear to auscultation.  There is no  rhonchi, rubs  or wheezes.  CARDIOVASCULAR:  Regular rate and rhythm, positive S1-S2.  No S3, S4.  No murmurs, rubs or gallops.  ABDOMEN:  Obese, nontender, nondistended with positive bowel sounds.  There is no longer any redness in the left groin.  EXTREMITIES:  Left leg is now wrapped in an Unna boot and there is  significant regression and erythema related to the top part of her leg  which is uncovered and visible.  She has 1+ pulses bilaterally in the  feet, 2+ in the radial pulses.  NEUROLOGICALLY:  The patient is intact.  Cranial nerves II-XII power  5/5, the patient is able to ambulate without difficulty.  PSYCHIATRIC:  Affect is appropriate.  Recent and remote memory are  intact.  Judgment and insight are intact.   PERTINENT LABORATORIES:  On the day of discharge her sodium is 139,  potassium 3.7, chloride 103, CO2 29, BUN is 5, creatinine 0.61.  Her CBC  reveals white count of 12.6 with 85% neutrophilia.  Her hemoglobin is  10.1, hematocrit is 29.4, platelets are 392.  Stool cultures are showing  decreased flora but no infection.  The patient will, therefore, be  instructed to eat yogurt with her meals, that would be 1-2 times daily.  Her urinalysis was completely negative.  She has no respiratory symptoms  and, in fact, her portable chest x-ray post PICC placement on the 25th  showed no acute abnormality.   OTHER IMAGING PROCESSES:  The patient had a venous Doppler done on June  19th which showed no obvious DVT.  An acute abdominal series on the 19th  also showed no obstructive bowel pattern and no free air.   The patient's blood cultures from the 18th have remained negative times  5 days.   Telemetry strips during the hospital revealed normal sinus rhythm, there  is no electrocardiogram to the chart.  At this time the patient is deemed stable for discharge to home with  Home Health visiting for treatment with vancomycin 1.5 grams IV times 12  hours for the next 10  days.  She will need a re-evaluation by Dr. Sherwood Gambler,  preferably at the end of this week.   I would like her to have the Unna boot removed on Wednesday to assure  that there is nothing going on under there in terms of infection that we  would be missing, that can be resumed and re-evaluated on Sunday, July  4th, and then weekly as long as there is no evidence for worsening  infection underneath the bandage.   The patient has access to Dr. Lovell Sheehan, for any further difficulty with  the [facility] boot, as directed by his interaction with this patient.   DISPOSITION AT THE TIME OF DISCHARGE:  Stable without signs of obvious  infection despite an elevated white count.   DISPOSITION:  To home and home health has been contacted with orders for  follow-up.   TOTAL TIME ON THIS CASE:  50 minutes.   I actually had given Dr. Sherwood Gambler a follow-up call on Friday and will  likely bring him up to take again on Monday.      Melissa L. Ladona Ridgel, MD  Electronically Signed     MLT/MEDQ  D:  11/26/2008  T:  11/26/2008  Job:  [id number]   cc:   Madelin Rear. Sherwood Gambler, MD  Fax: [phone]

## 2010-10-15 NOTE — Group Therapy Note (Signed)
NAME:  Samantha [name], Samantha [name]                ACCOUNT NO.:  [id number]   MEDICAL RECORD NO.:  [id number]          PATIENT TYPE:  INP   LOCATION:  A323                          FACILITY:  [facility]   PHYSICIAN:  Melissa L. Ladona Ridgel, MD  DATE OF BIRTH:  1945-07-25   DATE OF PROCEDURE:  11/20/2008  DATE OF DISCHARGE:                                 PROGRESS NOTE   Patient is doing okay other than the fact that she continues to have  spiking temperatures and a fair amount of pain in the left leg.  The  patient asked if she could have her aspirin 81 mg 2 tablets  half an  hour before her niacin but otherwise is not complaining of any new  symptoms.   PHYSICAL EXAMINATION:  VITAL SIGNS:  T max is 101.4.  Currently it is  98.5.  Blood pressure 139/69, pulse 97, respirations 20, saturation 92%.  GENERAL:  This is an obese white female in no acute distress.  She is normocephalic and atraumatic.  Pupils are equal, round and  reactive to light.  Extraocular muscles are intact.  She has anicteric  sclerae.  Examination of the nose reveals septum midline without  discharge.  Examination of the mouth reveals no oral lesions or lip  lesions.  NECK:  Supple.  There is no JVD.  No lymphadenopathy or carotid bruits.  CHEST:  Decreased and clear to auscultation.  There is no rales,  wheezes, or rhonchi.  CARDIOVASCULAR:  Regular rate and rhythm.  Positive S1 and S2.  No S3 or  S4.  No murmurs, rubs or gallops.  ABDOMEN:  Obese, nontender.  Positive bowel sounds.  There is no  hepatosplenomegaly.  No guarding.  No rebound.  EXTREMITIES:  Significant swelling and hyperpigmentation of the left  leg.  There is an area inside the hyperpigmented skin that appears that  it may be an abscess.  NEUROLOGIC:  Cranial nerves II-XII are intact.  Power is 5/5.  Limitations in the left leg secondary to pain.  Patient also has some  lymphadenopathy in the left groin with some cellulitis at the top of the  leg as well.   PERTINENT  LABORATORY VALUES:  Blood cultures have been negative x2.  Sodium 137, potassium 4.5, chloride 105, CO2 26, BUN 12, creatinine 0.9.  White count 6.5, hemoglobin 10, hematocrit 28.9, platelets 149.  Hemoglobin A1C was 5.7.  Magnesium was 1.6.   ASSESSMENT/PLAN:  This is a 65 year old white female with a past medical  history for cardiomyopathy, diabetes, and hypertension who presented  with left leg cellulitis.  Patient is persisting in having fever, and I  am concerned that there may be an abscess.  It might need to be drained.  Patient is currently on vancomycin and Zosyn.  1. Cellulitis:  I am going to order a surgical consult in the morning      to see if this area that is in the hyperpigmented skin is more      organized and drainable.  Currently, the patient has significant      lymphadenopathy and some  lymphedema.  We will continue the      antibiotics.  She is on day #3.  She is to continue to have the      Zosyn and vancomycin but will consider adding possible coverage of      clindamycin for strep toxin.  2. Cardiomyopathy:  Currently stable on home medications.  Will      continue those.  3. Gastroenteritis:  She still seems to have multiple stools, but is      less troublesome to her today.  We will go ahead and order a C.      diff culture since she has been on antibiotics.  4. Infectious disease:  We will consider adding better strep coverage      if there is nothing drainable, and it appears that this may be an      infection.  She does not appear to have the compromised limb at      this time.  5. Deep venous thrombosis prophylaxis will be with Lovenox.  6. Diabetes:  Not too badly controlled in the face of possible      infection.  We will continue her current medications and re-      evaluate her home regime at the time of discharge.  7. Hypothyroidism:  We will continue oral replacement.  8. Anemia: Will heme-check her stools.   Total duration took 30  minutes.      Melissa L. Ladona Ridgel, MD  Electronically Signed     MLT/MEDQ  D:  11/20/2008  T:  11/21/2008  Job:  [id number]

## 2010-10-15 NOTE — Group Therapy Note (Signed)
NAME:  Samantha [name], Samantha [name]                ACCOUNT NO.:  [id number]   MEDICAL RECORD NO.:  [id number]          PATIENT TYPE:  INP   LOCATION:  A323                          FACILITY:  [facility]   PHYSICIAN:  Melissa L. Ladona Ridgel, MD  DATE OF BIRTH:  12-Jul-1945   DATE OF PROCEDURE:  11/22/2008  DATE OF DISCHARGE:                                 PROGRESS NOTE   The patient again remains with low grade temperatures, but she shows  improvement in the number of spikes.  T-max actually was 100.4 and  during the course of today has only been in the high 90s.  That is a  significant improvement.  She states that the leg is less painful today,  that it seems to be less swollen.  She is still having a few little  loose stools and felt a little nauseated this morning, but otherwise is  feeling well.   PHYSICAL EXAMINATION:  VITAL SIGNS:  98.6, blood pressure 120/61, pulse  84, respirations 20.  Saturation 92-95%.  Her intake and output have not  been completely tallied.  She has had only one stool today that is  recorded, although she does report that is remains loose.  GENERAL:  This is an obese white female who is in less distress today  than she had been yesterday during the examination.  HEENT:  She is normocephalic, atraumatic.  Pupils are equal, round and  reactive to light.  Extraocular muscles intact.  She has anicteric  sclerae.  Examination of nose reveals septum midline without discharge.  Examination of mouth reveals no oral lesions or lip lesions.  NECK:  Supple.  There is no JVD.  No lymph nodes.  No carotid bruits.  CHEST:  Decreased, but clear to auscultation.  There is no rhonchi,  rales or wheezes.  CARDIOVASCULAR:  Regular rate and rhythm.  Positive  S1-S2.  No S3-S4.  No murmurs, rubs or gallops.  ABDOMEN:  Obese, nontender, nondistended with positive bowel sounds.  EXTREMITIES:  Show that the erythema in the lower extremity has  regressed from the lines that were marked at the periphery  as it is  becoming a little less foliaceous, it is definitely not as tender to  touch.  The patient still has some spotty erythema that is fading on the  left thigh and left groin.  Overall, I am pleased with the progress even  though it is minor.  NEUROLOGICAL:  Cranial nerves II-XII intact.  Power in the upper  extremity is 5/5, lower extremity power is limited in the examination by  discomfort but is definitely improved.   PERTINENT LABORATORY VALUES:  Her blood cultures remain no growth.  Stool culture is no C diff.  CBC was 8.7 with a hemoglobin of 9.7,  hematocrit 28.3.  Her sodium is 139, potassium 3.5, chloride 108, CO2 of  27, BUN 6, creatinine 0.8.  Heme check of her stool was negative.  Hemoglobin A1c was 5.7.   ASSESSMENT/PLAN:  This is a 65 year old white female with left leg  cellulitis and possible area of abscess.  She has continued  to have  persistent fevers, although today is the first day that she really seems  to be defervescing.  She is currently on appropriate methicillin-  resistant Staphylococcus aureus treatment with vancomycin.  Her Zosyn  was discontinued and clindamycin was started in its place.  Surgery has  been dutifully following along with me.  At this time, there does not  appear to be any drainable pus   1. Cellulitis.  Abscess is being monitored very closely by Dr.      Lovell Sheehan.  She is day 4 of vancomycin.  Her Zosyn was discontinued      and she is now day two of clindamycin.  2. Cardiomyopathy, currently stable and compensated on her home      medications.  Those will be continued.  3. Gastroenteritis.  Her Clostridium difficile cultures have been      negative and her stools have not been positive for blood.  I am      going to go ahead and add Questran to see if I can firm up her      stools a little bit and continue to monitor for progression.  4. Diabetes.  Currently she is adequately treated, receiving sliding      scale insulin and her  Actos.  Her blood glucoses have been 133, 166      which is a touch high and 119  5. Hypothyroidism.  We will continue her Synthroid.  6. Anemia.  We will continue to follow that.  She has already had an      outpatient workup and does not need further additional workup here      in the hospital.   Total time of her case was 30 minutes.      Melissa L. Ladona Ridgel, MD  Electronically Signed     MLT/MEDQ  D:  11/22/2008  T:  11/23/2008  Job:  [id number]

## 2010-10-15 NOTE — H&P (Signed)
Samantha [name], Samantha [name]                ACCOUNT NO.:  [id number]   MEDICAL RECORD NO.:  [id number]          PATIENT TYPE:  INP   LOCATION:  A323                          FACILITY:  [facility]   PHYSICIAN:  Osvaldo Shipper, MD     DATE OF BIRTH:  29-Dec-1945   DATE OF ADMISSION:  11/17/2008  DATE OF DISCHARGE:  LH                              HISTORY & PHYSICAL   PRIMARY CARE PHYSICIAN:  Madelin Rear. Sherwood Gambler, M.D.   CARDIOLOGIST:  [facility].   ADMISSION DIAGNOSES:  1. Left lower extremity cellulitis.  2. Possible superimposed gastroenteritis.  3. Type 2 diabetes.  4. Peripheral artery disease.  5. History of nonischemic cardiomyopathy.   CHIEF COMPLAINT:  Nausea, vomiting.   HISTORY OF PRESENT ILLNESS:  The patient is a 65 year old Caucasian  female who has a past medical history of peripheral artery disease and  diabetes who is obese who was in her usual state of health till  yesterday evening when she started feeling cold.  She took a hot shower.  This was followed by onset of chills.  She could not sleep well through  the night and this morning.  Then she started becoming nauseous and had  multiple episodes of vomiting followed by diarrhea.  She denies any  abdominal pain.  She complains of weakness.  She had a headache.  She  also had fever though she did not check her temperature.  She denies any  cough or chest pain.  She denies eating out anywhere the last few days.  She denies any sick contacts in the last few days.   MEDICATIONS AT HOME:  1. Glipizide 20 mg q.a.m.  2. Metformin 1000 mg b.i.d.  3. Tramadol 100 mg every 6 hours as needed for pain.  4. Actos 30 mg at bedtime.  5. Ferrocite 325 mg daily.  6. Folic acid 1 mg daily.  7. Enalapril 10 mg daily.  8. Lasix 80 mg daily.  9. Carvedilol 25 mg b.i.d.  10.Niaspan 1000 mg at bedtime.  11.Zetia 10 mg daily.  12.Crestor 20 mg at bedtime.  13.Lorcet 10/650 as needed.  14.Gabapentin 100 mg three times a day.  15.Synthroid 100 mcg daily.  16.Cilostazol 50 mg at bedtime.  17.Aspirin 81 mg daily.  18.Ocuvite Lutein once a day.  19.CQ10 once a day.   ALLERGIES:  She is allergic to AUGMENTIN, ERYTHROMYCIN, METRONIDAZOLE,  and PREVACID which cause swelling for years and elects she is allergic  to [facility] and BACTRIM which cause a primary low blood sugar.   PAST MEDICAL HISTORY:  Past medical history is positive for:  1. Brain aneurysm with coil placement.  2. Surgery for hammertoe bone spurs.  3. Appendectomy.  4. Tonsillectomy.  5. Stents to her left lower extremity for peripheral vascular disease.  6. Peripheral vascular disease in the right leg for which a stent      could not be placed.  She actually then had an angiogram in 2007      and intervention was tried, but it was not successful.  7. She has had EGDs and colonoscopies in  the past.  Reports are      available on the computer of EGD but none of the colonoscopy.   SOCIAL HISTORY:  Lives in [place] with her husband, unemployed.  Does  not smoke, does not drink, no illicit drug use.  She is able to ambulate  independently.   FAMILY HISTORY:  Positive for brain tumor, lung cancer, diabetes,  endocarditis.   REVIEW OF SYSTEMS:  GENERAL:  System positive for weakness, malaise.  HEENT:  Positive for headache, otherwise unremarkable.  CARDIOVASCULAR:  Unremarkable.  RESPIRATORY:  Unremarkable.  GI: As in HPI.  GU:  Unremarkable.  NEUROLOGICAL:  Unremarkable.  PSYCHIATRIC:  Unremarkable.  DERMATOLOGIC:  As in HPI.  Other systems unremarkable.   PHYSICAL EXAMINATION:  VITAL SIGNS:  Temperature 102.9, blood pressure  137/60.  Heart rate 102, respiratory rate 20, oxygen saturation 95% on  room air.  GENERAL:  An obese white female, slightly lethargic but in no distress.  HEENT: There is no pallor, no icterus.  Oral mucous membranes dry.  No  oral lesions are noted.  NECK:  Soft and supple.  No thyromegaly is appreciated.  No   lymphadenopathy is noted.  LUNGS:  Clear to auscultation bilaterally.  No wheezing, rales or  rhonchi.  CARDIOVASCULAR:  S1 and S2 normal,  regular.  No murmurs appreciated.  No S3 or S4.  No rubs.  No bruits.  ABDOMEN:  Soft, nontender, nondistended.  Bowel sounds are present.  No  masses or organomegaly appreciated.  RECTAL:  Deferred.  GU:  Deferred.  EXTREMITIES:  The patient also has 1+ pitting edema bilateral lower  extremities.  MUSCULOSKELETAL:  Full range of motion is present.  NEUROLOGICALLY:  She is alert, oriented x3.  No focal neurological  deficits are present.  SKIN:  Left lower extremity shows erythema, warm to touch, tenderness  anteriorly between the knee and ankle.  It is mostly located anteriorly  but also extends posteriorly.  There is also on the right side chronic  wounds in the lower leg as well as in the plantar aspect of the foot.   LABORATORY DATA:  White count is 14,000 with 89% neutrophils.  No bands  reported.  Hemoglobin is 7.2, MCV is 84, platelet count is 171,  potassium is 2.9.  Renal function is normal.  No imaging studies have  been done.   ASSESSMENT:  1. This is a 65 year old Caucasian female with past medical history as      stated earlier who presents with nausea, vomiting, diarrhea, chills      and is found to have cellulitis of the left lower extremity.  The      etiology for her fever is most likely the cellulitis.  Unclear why      she is having nausea, vomiting, and diarrhea.  She did take a      course of Keflex about 2 weeks ago for a toe infection.      Possibility of Clostridium difficile is there.  Otherwise I think      this is more of superimposed gastroenteritis.  2. She is also mildly dehydrated.   PLAN:  1. Left lower extremity cellulitis.  We will cover with vancomycin and      add cephazolin to provide Gram negative coverage.  She is allergic      to quite a few antibiotics which precludes their use.  Blood      cultures  have been sent.  Will also get a venous  Doppler study to      make sure there is no DVT.  2. Nausea, vomiting, diarrhea, possibly viral or superimposed      gastroenteritis.  Stool studies will be ordered considering recent      antibiotic use.  3. Diabetes.  Continue with metformin.  Hold off on the glyburide.      Check CBG and put her on sliding scale.  4. History of an cardiomyopathy.  This is thought to be nonischemic      based on a cardiac cath in 2004.  Her EF was found to be about 50%      at that time.  We will hold Lasix tonight and then start with IV      dose for 3 days from tomorrow.  5. Hypokalemia will be repleted and a magnesium level will be checked.  6. History of peripheral vascular disease.  Continue with cilostazol  7. History of dyslipidemia.  Continue with Niaspan, Zetia and Crestor.  8. Deep venous thrombosis prophylaxis will be utilized.   Further management decisions will depend on results of further testing  and patient's response to treatment.      Osvaldo Shipper, MD  Electronically Signed     GK/MEDQ  D:  11/17/2008  T:  11/18/2008  Job:  [id number]   cc:   Madelin Rear. Sherwood Gambler, MD  Fax: [phone]   Richard A. Alanda Amass, M.D.  Fax: [phone]

## 2010-10-15 NOTE — Group Therapy Note (Signed)
NAME:  Clayborn, Kalany                ACCOUNT NO.:  [id number]   MEDICAL RECORD NO.:  [id number]          PATIENT TYPE:  INP   LOCATION:  A323                          FACILITY:  [facility]   PHYSICIAN:  Melissa L. Ladona Ridgel, MD  DATE OF BIRTH:  [birth year]   DATE OF PROCEDURE:  11/24/2008  DATE OF DISCHARGE:                                 PROGRESS NOTE   SUBJECTIVE:  The patient is continuing to do well with no complaints of  shortness of breath, pain, chest pain, diarrhea remains a description of  the quality of her stool but she is not having copious amounts of this,  it is just loose.  The patient states that the leg still feels tight.  The pain however is more bearable and she does see some progression in  improvement in the color of the leg.   OBJECTIVE:  VITAL SIGNS:  T-max was 98.6.  Today she is 97, blood  pressure 139/77, pulse 77, respirations 20, saturation 94%.  Intake and  output for the last 24 hours she is 1080 in and six voids.  GENERAL:  This is a morbidly obese white female in no acute distress.  HEENT:  She is normocephalic, atraumatic.  Pupils equal, round and  reactive to light.  Extraocular muscles are intact.  Mucous membranes  are moist.  NECK:  Supple.  There is no JVD.  No lymph nodes.  No carotid bruits.  CHEST:  Is clear to auscultation with good air entry sitting on the edge  of the bed.  CARDIOVASCULAR:  Regular rate and rhythm.  Positive S1, S2.  No S3, S4.  No murmurs, rubs or gallops.  ABDOMEN:  Obese, nontender, nondistended with positive bowel sounds.  There is no hepatosplenomegaly, no guarding, no rebound.  EXTREMITIES:  Examination of the leg reveals no erythema in the left  groin or left thigh.  The beefy redness of the left lower extremity is  starting to recede.  She has a peripheral halo of pink and a beefy red  center which has improved since yesterday.  She has 2+ radial pulses.  NEUROLOGICAL:  Cranial nerves II-XII are intact.  Power is 5/5.   DTRs  are 2+.  Plantars are downgoing.   PERTINENT LABORATORY VALUES:  Today reveal a stool culture is negative  but with reduced flora.  I have not ordered any other labs for her  today.  We will reevaluate her in the morning.   ASSESSMENT AND PLAN:  This is a 65 year old white female with a right  leg cellulitis.  She persists in having no drainable areas.  With the  combination of vancomycin and clindamycin she has defervesced over the  last 24 hours and is still having some trouble with edema and pain but  doing better.  I reviewed the case with Dr. Lovell Sheehan.  He would like to  try applying an Radio broadcast assistant and continuing home IV antibiotics.  We  therefore will convert the patient to oral clindamycin to see if she can  tolerate that with her GI tract and continue IV vancomycin which  will  travel home with her.  1. Cellulitis.  Again, IV vancomycin 1.5 g q.12 hours and oral      clindamycin 450 mg p.o. q.6 hours will be ordered.  A PICC will be      obtained in order to facilitate home vanco therapy.  2. Cardiomyopathy, currently stable on home medications.  3. Loose stools.  Again, the Questran did not completely resolve the      symptom and therefore I will go ahead and add lactobacillus and      encourage her to eat some yogurt.  4. Diabetes.  Currently she has been doing fairly well on her Actos      and sliding scale.  Her values this morning 124, 119 and 134.  5. Anemia, chronic, has been worked up as an outpatient and continues      to remain stable for me here so I will not do any further workup of      this here.   Total time on this case was 30 minutes, conversation with Dr. Lovell Sheehan  and will give Dr. Sherwood Gambler a call this morning to let him know our plan.      Melissa L. Ladona Ridgel, MD  Electronically Signed     MLT/MEDQ  D:  11/24/2008  T:  11/24/2008  Job:  [id number]

## 2010-10-15 NOTE — Group Therapy Note (Signed)
NAME:  Samantha [name], Samantha [name]                ACCOUNT NO.:  [id number]   MEDICAL RECORD NO.:  [id number]          PATIENT TYPE:  INP   LOCATION:  A323                          FACILITY:  [facility]   PHYSICIAN:  Melissa L. Ladona Ridgel, MD  DATE OF BIRTH:  1945-09-24   DATE OF PROCEDURE:  11/21/2008  DATE OF DISCHARGE:                                 PROGRESS NOTE   The patient remains with low-grade fever overnight.  She states the leg  is still painful to touch.  She is still having some loose stools.  She  was a little nauseated this morning but otherwise was able to eat.  Has  placed a consultation for Dr. Lovell Sheehan to see her today and will decide  on whether or not a surgical intervention is necessary.  In lieu of that  and despite her diarrhea, I have elected to add clindamycin IV to try to  attack the potential streptococcus-type bacteria which may cause a  coccigenic response in the leg accounting for the inflamed violaceous  changes noted in the skin.  Clostridium difficile culture is currently  pending, was able to have that sent this morning.   Currently, the patient's vital signs:  T-max was 101.4 - this morning,  she was 100.7; blood pressure 126/73; pulse 90; respirations 18;  saturations 93% on room air.  GENERAL:  Morbidly obese white female in no acute distress.  Normocephalic, atraumatic.  Pupils are equal, round, and reactive to  light.  Extraocular muscles are intact.  She has anicteric sclerae.  Examination of the nose reveals no discharge or inflammation.  Examination of the mouth:  Mucous membranes are moist.  There are no  oral lesions.  NECK:  There is no JVD, no lymph nodes, no carotid bruits.  CHEST:  Clear to auscultation.  There are no rhonchi, rales, or wheezes.  CARDIOVASCULAR:  Regular rate and rhythm.  Positive S1-S2.  No S3-S4.  No murmurs, rubs or gallops.  ABDOMEN:  Obese, nontender, nondistended with positive bowel sounds.  EXTREMITIES:  There is erythema in the left  groin.  She has tenderness  of the lymph nodes.  There are 2 spots of erythema that have formed on  the left thigh.  The erythematous changes of the lower extremities  persist with possibility of extension.  I have asked the nursing staff  to please demarcate with a pen the margins.  There did not appear to be  any drain of any obvious draining material at this time, but she  continues to have what appears to have a pearly collection in the calf  area.   Radiologically, her Dopplers of the left lower extremity on the 19th  were negative for thrombus, and so I will speak with surgery before  ordering further imaging tests that may not aid him in helping with her  case.   Pertinent laboratories today revealed fecal occult testing was negative.  Blood cultures remained negative.  Her sodium is 137, potassium 3.6,  chloride 107, CO2 24, glucose 103, BUN 9, creatinine 0.8.  Her CBC  reveals a white count  of 7.7, hemoglobin 9.9, hematocrit 26.8 which is  just slightly decreased from the 21st and has definitely trended down  over the course since admission at which time her hemoglobin was 11.2.   ASSESSMENT AND PLAN:  This is a 65 year old female with left flank  cellulitis and possible old abscess.  She has persistent fevers.  She is  on appropriate coverage for methicillin-resistant staphylococcus aureus,  treating with vancomycin and Zosyn.  She persists, however, in having  fever, pain and changes in the skin.  Therefore, I have ordered surgical  consult for today.   1. Cellulitis, possible abscess.  Surgical consult.  Continue      antibiotics, day #4 of Zosyn and vancomycin.  I will add      clindamycin to cover for streptococcal toxin.  2. Cardiomyopathy, currently clinically stable and compensated.      Continue her home medications.  3. Gastroenteritis.  Still has multiple stools.  Clostridium difficile      is pending.  She is heme negative.  4. Infectious disease.  I am going  to go ahead and add the clindamycin      and will continue to follow her stools.  5. Deep venous thrombosis prophylaxis with Lovenox.  Will continue to      monitor her hemoglobin.  6. Diabetes.  Currently, she is adequately treated, receiving sliding      scale insulin and oral Actos.  Blood glucoses have been 133 and      105, respectively.  7. Hypothyroidism.  Will continue her Synthroid.  8. Anemia.  Again, her heme check of her stool was negative.  The      patient has already had outpatient evaluation for colonoscopy with      Dr. Jena Gauss and Dr. Lovell Sheehan.  Will continue to monitor her      hemoglobin.   Total time on this case is 30 minutes.      Melissa L. Ladona Ridgel, MD  Electronically Signed     MLT/MEDQ  D:  11/21/2008  T:  11/21/2008  Job:  [id number]

## 2010-10-15 NOTE — H&P (Signed)
NAME:  Samantha [name], Samantha [name]                ACCOUNT NO.:  [id number]   MEDICAL RECORD NO.:  [id number]          PATIENT TYPE:  AMB   LOCATION:  DAY                           FACILITY:  [facility]   PHYSICIAN:  Dalia Heading, M.D.  DATE OF BIRTH:  1946-02-05   DATE OF ADMISSION:  DATE OF DISCHARGE:  LH                              HISTORY & PHYSICAL   CHIEF COMPLAINT:  Hemoccult positive stools, anemia, and GERD.   HISTORY OF PRESENT ILLNESS:  The patient is a 65 year old white female  who was referred for endoscopic evaluation.  She had both colonoscopy  and EGD for Hemoccult positive stools, GERD, anemia.  She tested  positive for H. pylori infection is 2007, but could not finish the  treatment after 10 days due to an allergic reaction to some medication.  She also recently tested positive on hemoccult positive cards.  No  abdominal pain, weight loss, nausea, vomiting, diarrhea, constipation,  or melena had been noted.  She last had a colonoscopy in 2005 which was  unremarkable.  There is no family history of colon carcinoma.   Past medical history includes:  1. Non-insulin dependent diabetes mellitus.  2. Coronary artery disease.  3. Hypertension.  4. Hypothyroidism.  5. Peripheral vascular disease.  6. Iron deficiency anemia.  7. High cholesterol levels.  8. Obesity.   PAST SURGICAL HISTORY:  As noted above, appendectomy, back surgery, foot  surgery, iliac stent placement, knee surgery, brain aneurysm.   CURRENT MEDICATIONS:  Tramadol, Cilostazol, gabapentin, Actos, Niaspan,  Zetia, Crestor, folic acid, glipizide, baby aspirin, Lasix, iron  supplements, levothyroxine, metformin, enalapril, carvedilol.   ALLERGIES:  AUGMENTIN, METRONIDAZOLE, CLARITHROMYCIN, PREVACID.   REVIEW OF SYSTEMS:  The patient denies drinking or smoking.  She denies  any recent chest pain, MI, CVA.   PHYSICAL EXAMINATION:  GENERAL:  The patient is well-developed, well-  nourished white female in no acute  distress.  LUNGS:  Clear to auscultation with equal breath sounds bilaterally.  HEART:  Regular rate and rhythm without S3, S4, murmurs.  ABDOMEN:  Soft, nontender, nondistended.  No hepatosplenomegaly or  masses are noted.  RECTAL:  Deferred to the procedure.   IMPRESSION:  1. Hematochezia.  2. Gastroesophageal reflux disease.  3. Anemia.   PLAN:  The patient is scheduled for an EGD and colonoscopy on March 14, 2008.  The risks and benefits of the procedure including bleeding  and perforation were fully explained to the patient, gave informed  consent.      Dalia Heading, M.D.  Electronically Signed     MAJ/MEDQ  D:  03/09/2008  T:  03/10/2008  Job:  [id number]   cc:   Madelin Rear. Sherwood Gambler, MD  Fax: [phone]   Richard A. Alanda Amass, M.D.  Fax: [phone]   [facility] Short [facility]

## 2010-10-15 NOTE — Group Therapy Note (Signed)
NAME:  Samantha [name], Samantha [name]                ACCOUNT NO.:  [id number]   MEDICAL RECORD NO.:  [id number]          PATIENT TYPE:  INP   LOCATION:  A323                          FACILITY:  [facility]   PHYSICIAN:  Melissa L. Ladona Ridgel, MD  DATE OF BIRTH:  1945-09-10   DATE OF PROCEDURE:  11/25/2008  DATE OF DISCHARGE:                                 PROGRESS NOTE   SUBJECTIVE:  The patient continues to have no significant complaints,  although she did note that there was some exudate on her pillow and that  she is resting her left leg on this morning.  Overnight, she had no  difficulties in this morning had a formed stool.  Her leg continues to  be less painful, but continues to have significant redness.   OBJECTIVE:  VITAL SIGNS:  She remained afebrile throughout yesterday  with a T-max of 99.5.  Currently today, she said no low grade  temperatures.  Her temperature is 98.3, blood pressure 132/64, pulse 59,  respirations 20, saturation 95%.  Intakes appear to be adequate, output  appeared to be adequate.  GENERAL:  This is a obese white female in no acute distress.  HEENT:  She is normocephalic, atraumatic.  Pupils equal, round and  reactive to light.  Extraocular muscles are intact.  Mucous membranes  are moist.  NECK:  Supple.  There is no JVD.  No lymph nodes.  No carotid bruits.  CHEST:  Clear to auscultation.  There is no rhonchi, rales or wheezes.  CARDIOVASCULAR:  Regular rate rhythm.  Positive S1-S2.  No S3-S4.  No  murmurs, rubs or gallops.  ABDOMEN:  Obese, nontender, nondistended with positive bowel sounds.  EXTREMITIES:  Reveal no further redness in the upper thigh of the left  leg.  All of the erythema is limited to the lower extremity which  continues to regress towards the center of her leg.  The back of her leg  is continuing to have decreased swelling, which is causing the areas  that were most edematous to rupture through the skin blistering  membranes and have a bit of discharge.   There is no purulence, but it is  clear yellow discharge.   PERTINENT LABORATORIES:  The patient today unfortunately has a slightly  elevated white count at 11.1, which is new for her.  Her hemoglobin is  8.9 and 2.6 which is titering on decreasing.  Her platelet count is  stable at 268.  Her sodium today is 139, potassium 3.6, chloride 106,  CO2 of 29, BUN of 6, creatinine 0.76 with a glucose of 144.  Urinalysis  reveals completely negative, so this is not the source for her elevation  in her white count.  Blood cultures have remained negative.   ASSESSMENT/PLAN:  This is a very pleasant 65 year old white female with  right leg cellulitis that persists without having any drainable areas of  abscess.  We though we were making fairly good headway and I think that  we are still are in that the leg swelling is improving and the erythema  is regressing.  However, today  she does have a slightly elevated white  count of 11.1, which she did not have previously.  She had one low-grade  temperature yesterday, but no big temperature spikes.  I therefore have  elected to keep the patient in the hospital until we can show that she  is not trending upward in her leukocytosis.  I have reviewed the case  again with Dr. Lovell Sheehan whose plan was to put an [place] boot today.  We  will attempt to discharge the patient stably home to continue IV  antibiotic therapy and follow up as an outpatient with Dr. Lovell Sheehan.   1. Cellulitis.  Continue the IV vancomycin and oral clindamycin.  A      PICC line was placed yesterday to facilitate home antibiotic      therapy.  The patient today, however, has not elevated white count      and I feel that we need to keep her to make sure that it is not      continuing to rise, so we will forego discharge today.  2. Cardiomyopathy, currently stable.  3. Loose stools.  Questran was discontinued and lactobacillus was      ordered.  Today, she had her first form stool which is  excellent.      We will continue to follow.  4. Diabetes, currently doing fairly well with her CBGs ranging in the      140-150 range, which is not too terrible under the current      stressor.  5. Anemia.  While she is still lightly lower today, I am not sure what      that means in the face of her chronicity.  We will just to continue      to monitor that.   Total time on this case is 20 minutes.      Melissa L. Ladona Ridgel, MD  Electronically Signed     MLT/MEDQ  D:  11/25/2008  T:  11/25/2008  Job:  [id number]

## 2010-10-18 NOTE — Op Note (Signed)
NAME:  [name], Samantha                ACCOUNT NO.:  [id number]   MEDICAL RECORD NO.:  [id number]          PATIENT TYPE:  AMB   LOCATION:  DAY                           FACILITY:  [facility]   PHYSICIAN:  R. Roetta Sessions, M.D. DATE OF BIRTH:  January 16, 1946   DATE OF PROCEDURE:  12/16/2005  DATE OF DISCHARGE:                                 OPERATIVE REPORT   PROCEDURE:  EGD and small bowel biopsy.   INDICATIONS FOR PROCEDURE:  [name] with anemia and hemoccult  positive stool.  Negative colonoscopy two years ago by Dr. Lovell Sheehan.  EGD is  now being done.  This procedure has been discussed with the patient at  length.  The potential risks, benefits, and alternatives have been reviewed.  Questions answered.  She is agreeable.  Please see documentation in the  medical record.   PROCEDURE NOTE:  O2 saturation, blood pressure, pulse, and respirations  monitored throughout the entire procedure.   CONSCIOUS SEDATION:  Versed 4 mg IV, Demerol 75 mg IV in divided doses.  Cetacaine spray for topical oropharyngeal anesthesia.   INSTRUMENT:  Olympus video chip system.   FINDINGS:  EGD examination of the tubular esophagus revealed no mucosal  abnormalities.  EG junction easily traversed.  Stomach:  Gastric cavity was  emptied and insufflated well with air.  Thorough examination of the gastric  mucosa, including retroflexed view of the proximal stomach and  esophagogastric junction, demonstrated a mottled, patchy erythema of the  gastric mucosa and some snake-skinning appearance.  There was a 1 cm  submucosal impression on the distal greater curvature of doubtful clinical  significance.  No ulcer or infiltrative process seen.  Pylorus patent and  easily traversed.  Examination of the bulb through the third portion  revealed no abnormalities.   THERAPEUTIC/DIAGNOSTIC MANEUVERS PERFORMED:  Multiple biopsies of D2 and D3  were taken for histologic study.   The patient tolerated the procedure  well and was reactivated.   ENDOSCOPIC IMPRESSION:  1.  Normal esophagus.  2.  Mottled, patchy erythema of the stomach, with some snake-skinning of      the gastric mucosa.  3.  Small area of submucosal impression, likely representing extrinsic      impression on the stomach of doubtful clinical significance.  4.  Patent pylorus.  5.  Normal D1-D3.  Biopsies of D2 and D3 taken.   RECOMMENDATIONS:  1.  Follow up on pathology.  2.  Check Helicobacter pylori serologies.  3.  Further recommendations to follow.      Jonathon Bellows, M.D.  Electronically Signed     RMR/MEDQ  D:  12/16/2005  T:  12/16/2005  Job:  [id number]   cc:   Madelin Rear. Sherwood Gambler, MD  Fax: [phone]

## 2010-10-18 NOTE — Procedures (Signed)
NAME:  Samantha [name], [name]                         ACCOUNT NO.:  [id number]   MEDICAL RECORD NO.:  [id number]                    PATIENT TYPE:  PINP   LOCATION:                                       FACILITY:   PHYSICIAN:  Kem Boroughs, M.D.                 DATE OF BIRTH:   DATE OF PROCEDURE:  11/15/2002  DATE OF DISCHARGE:  11/15/2002                                  ECHOCARDIOGRAM   PROCEDURE:  Echocardiogram   REFERRING PHYSICIAN:  Corrie Mckusick, M.D., Madelin Rear. Sherwood Gambler, M.D., and  Kem Boroughs, M.D.   SURGEON:  Sherral Hammers, M.D.   INDICATIONS:  This patient is a 65 year old female who has a past medical  history of diabetes mellitus and was admitted with congestive heart failure.   Shortness of breath.   FINDINGS:  1. The aorta is within normal limits at 3.1 cm.  2. The left atrium was mildly dilated at 4.3 cm.  No obvious clots or masses     were appreciated, but this study is not adequate for assessment of subtle     regional wall motion abnormality secondary to the technical limitations     due to body habitus and poor acoustic windows.  She appeared to be in     sinus rhythm during this procedure.  3. The interventricular septum and posterior wall were mildly thickened     consistent with mild concentric left ventricular hypertrophy.  4. The aortic valve was trileaflet and pliable with good leaflet excursion.     No aortic insufficiency was noted.  Doppler interrogation of the aortic     valve was within normal limits.  5. The mitral valve also was reasonably normal in structure and function.     No mitral valve prolapse was noted.  Mild mitral annular calcification     was noted.  Mild-to-moderate mitral regurgitation was noted.  Doppler     interrogation of the mitral valve was within normal limits.  6. The pulmonic valve was not visualized.  7. The tricuspid valve was not well seen, but appeared grossly structurally     normal.  Mild tricuspid regurgitation  was noted.  8. The left ventricular was dilated with the  LVIDD measuring 5.7 cm and the     LVISD measured at 5.5 cm.  Overall left ventricular systolic function was     severely diminished with an estimated ejection fraction of 10-20%.     Severe global hypokinesis was appreciated.  The presence of diastolic     dysfunction was inferred from pulse wave Doppler across the mitral valve.     The right atrium and right ventricle were not well seen.  A small     posterior apical pericardial effusion was noted without evidence of     hemodynamic compromise.  No obvious clots or thrombus was noted but this  study is not adequate for assessment of subtle clots or masses.   IMPRESSION:  1. Mild left atrial enlargement.  2. Left ventricular enlargement.  3. Severely decreased ejection fraction estimated at 10-20% with severe     global hypokinesis.  4. Mild concentric left ventricular hypertrophy.  5. Tiny posterior apical pericardial effusion without evidence of     hemodynamic compromise.  6. The presence of diastolic dysfunction is inferred from pulse-wave Doppler     across the mitral valve. Mild-to-moderate mitral regurgitation.  7. Mild tricuspid regurgitation.                                               Kem Boroughs, M.D.    TB/MEDQ  D:  11/15/2002  T:  11/15/2002  Job:  [id number]   cc:   Corrie Mckusick, M.D.  [address] [place] [place]  [place]  [place] [address]  Fax: [phone]   Madelin Rear. Sherwood Gambler, M.D.  [address]  [place]  [place] [address]  Fax: [phone]

## 2010-10-18 NOTE — Cardiovascular Report (Signed)
NAME:  TATELudie, Hudon                ACCOUNT NO.:  [id number]   MEDICAL RECORD NO.:  [id number]          PATIENT TYPE:  AMB   LOCATION:  SDS                          FACILITY:  [facility]   PHYSICIAN:  Vonna Kotyk R. Jacinto Halim, MD       DATE OF BIRTH:  15-Apr-1946   DATE OF PROCEDURE:  DATE OF DISCHARGE:  05/07/2006                            CARDIAC CATHETERIZATION   PROCEDURE PERFORMED:  1. Abdominal aortogram.  2. Right iliac arteriogram with femoral runoff.  3. Right femoral angiography.  4. Failed attempt at angioplasty at the chronically occluded right      superficial femoral artery.  5. Left femoral arteriography with femoral runoff.   INDICATIONS:  Ms. Jya Hughston is 65 year old female with a history of  known peripheral arterial disease who has had a history of PTA and  stenting to her left SFA in the past and a history of known chronically  occluded right SFA.  She had developed a non-healing ulcer in her foot.  The left SFA stenting was done on May 08, 2003.  Recently her ABI on  the right was 0.5 and the left was 0.8.  Because of a non-healing ulcer  and markedly abnormal ABI, she was referred for abdominal aortogram and  bifemoral runoff with possible eye towards a revascularization of right  SFA.   ANGIOGRAPHIC DATA:  Abdominal aortogram:  Abdominal aortogram revealed  presence of 2 renal arteries following on either side  The mesenteric  and celiac trunk filled up nicely and well visualized.  The aortoiliac  bifurcation was widely patent.   Right iliac artery with femoral runoff:  Right iliac artery with femoral  runoff revealed the iliac artery to be widely patent.  The common  femoral artery was also widely patent.  The right superficial femoral  artery is occluded and there was collateralization from the profunda and  the SFA reconstitutes just out of the Hunter canal just above the knee.  There is three-vessel runoff noted in the right lower extremity.   Left femoral  arteriography with femoral runoff:  Left femoral  arteriography with femoral runoff revealed the left iliac and femoral  arteries to be widely patent.  However, there was a 60% stenosis just  proximal to the previously placed stents.  This did not appear to be  high grade.  The stent itself was widely patent with mild luminal  irregularity.  There was again three-vessel runoff noted in the left  lower extremity.   Intervention data:  Unsuccessful attempt at recanalizing the right SFA.  In spite of multiple attempts and use of guidewire and Wholey wire and  also guide catheter trying to advance and break the distal cap;  although, the proximal cap was successful broken and entered through.  I  was unable to successfully break the distal cap.   RECOMMENDATIONS:  Based on the anatomy, we could potentially consider  medical therapy, unless the foot does not heal, then she will need  femoral popliteal bypass surgery.   A total of 200 mL of contrast was utilized for diagnostic and attempted  interventional  procedure.   The left femoral artery was successfully closed with [employer] with  excellent hemostasis.   TECHNIQUE FOR PROCEDURE:  Under the usual sterile precautions using a 5-  French left femoral arterial access, a 5-French pigtail catheter was  advanced into abdominal aorta and abdominal aortogram was performed.  Then the catheter was pulled back into the distal abdominal aorta and  distal abdominal aortogram with iliac arteriogram was performed.   Using the angled pigtail catheter I was able to cross over from the left  iliac artery into the right iliac artery.  Using a short Wholey and used  a short Endocath and placed it in the iliac artery and the iliac  arteriogram with femoral runoff was performed.  The left iliac  arteriogram and femoral runoff was performed through the arterial access  sheath.   TECHNIQUE FOR INTERVENTION:  Using a 7-French Terumo sheath, I crossed   over from the left femoral artery into the right femoral artery.  Using  a 5-French straight blunt-tipped catheter and Wholey wire and also a  guidewire, multiple attempts were made to cross through the totally  occluded SFA.  I was successful in breaking the proximal cap but I was  not able to break the distal cap in spite of multiple attempts.   At this time, I decided to perform a right femoral arteriography with  distal runoff for documentation of the known distal embolization and  this revealed excellent previous runoff in the legs.  After having done  this, I pulled Terumo sheath back into left common femoral artery and  left iliac arteriogram with femoral arteriogram with a femoral follow-  through was performed.  Overall, the patient tolerated the procedure  well.  There were no immediate complications noted.      Cristy Hilts. Jacinto Halim, MD  Electronically Signed     JRG/MEDQ  D:  05/07/2006  T:  05/08/2006  Job:  [id number]   cc:   Madelin Rear. Sherwood Gambler, MD  Oley Balm Pricilla Holm, D.P.M.

## 2011-04-10 HISTORY — PX: OTHER SURGICAL HISTORY: SHX169

## 2011-04-28 ENCOUNTER — Other Ambulatory Visit (HOSPITAL_COMMUNITY): Payer: Self-pay | Admitting: Internal Medicine

## 2011-04-28 DIAGNOSIS — Z139 Encounter for screening, unspecified: Secondary | ICD-10-CM

## 2011-04-30 ENCOUNTER — Encounter: Payer: Self-pay | Admitting: Orthopedic Surgery

## 2011-04-30 ENCOUNTER — Ambulatory Visit (INDEPENDENT_AMBULATORY_CARE_PROVIDER_SITE_OTHER): Payer: Medicare HMO | Admitting: Orthopedic Surgery

## 2011-04-30 VITALS — BP 90/50 | Ht 71.0 in | Wt 342.0 lb

## 2011-04-30 DIAGNOSIS — M1710 Unilateral primary osteoarthritis, unspecified knee: Secondary | ICD-10-CM

## 2011-04-30 MED ORDER — TRAMADOL-ACETAMINOPHEN 37.5-325 MG PO TABS: 1.0000 | ORAL_TABLET | ORAL | Status: AC | PRN

## 2011-04-30 MED ORDER — TRAMADOL HCL 50 MG PO TABS: 50.0000 mg | ORAL_TABLET | Freq: Four times a day (QID) | ORAL | Status: AC | PRN

## 2011-04-30 NOTE — Patient Instructions (Signed)
You have received a steroid shot. 15% of patients experience increased pain at the injection site with in the next 24 hours. This is best treated with ice and tylenol extra strength 2 tabs every 8 hours. If you are still having pain please call the office.    

## 2011-04-30 NOTE — Progress Notes (Signed)
   The patient has bilateral knee pain previously seen for RIGHT knee injection. She also has failed stent placement in the RIGHT leg for peripheral vascular disease. So she is not a good surgical candidate.  She complains of bilateral knee pain, RIGHT greater than LEFT would like, injections in the knees at this time.  Bilateral knee injections were performed  Knee  Injection Procedure Note  Pre-operative Diagnosis: right knee oa  Post-operative Diagnosis: same  Indications: pain  Anesthesia: ethyl chloride   Procedure Details   Verbal consent was obtained for the procedure. Time out was completed.The joint was prepped with alcohol, followed by  Ethyl chloride spray and A 20 gauge needle was inserted into the knee via lateral approach; 4ml 1% lidocaine and 1 ml of depomedrol  was then injected into the joint . The needle was removed and the area cleansed and dressed.  Complications:  None; patient tolerated the procedure well.  Knee  Injection Procedure Note  Pre-operative Diagnosis: left knee oa  Post-operative Diagnosis: same  Indications: pain  Anesthesia: ethyl chloride   Procedure Details   Verbal consent was obtained for the procedure. Time out was completed.The joint was prepped with alcohol, followed by  Ethyl chloride spray and A 20 gauge needle was inserted into the knee via lateral approach; 4ml 1% lidocaine and 1 ml of depomedrol  was then injected into the joint . The needle was removed and the area cleansed and dressed.  Complications:  None; patient tolerated the procedure well.

## 2011-05-02 ENCOUNTER — Ambulatory Visit (HOSPITAL_COMMUNITY): Payer: Medicare HMO

## 2011-05-02 ENCOUNTER — Other Ambulatory Visit (HOSPITAL_COMMUNITY): Payer: Medicare HMO

## 2011-05-06 ENCOUNTER — Ambulatory Visit (HOSPITAL_COMMUNITY): Payer: Medicare HMO

## 2011-05-13 ENCOUNTER — Other Ambulatory Visit (HOSPITAL_COMMUNITY): Payer: Medicare HMO

## 2011-05-13 ENCOUNTER — Ambulatory Visit (HOSPITAL_COMMUNITY): Payer: Medicare HMO

## 2011-05-27 IMAGING — CR DG CHEST 1V PORT
1 series · 1 of 1 positions shown · non-contrast
Comparison: Chest 06/07/2008.

CLINICAL DATA: PICC placement.

PORTABLE CHEST - 1 VIEW

[view not recorded]
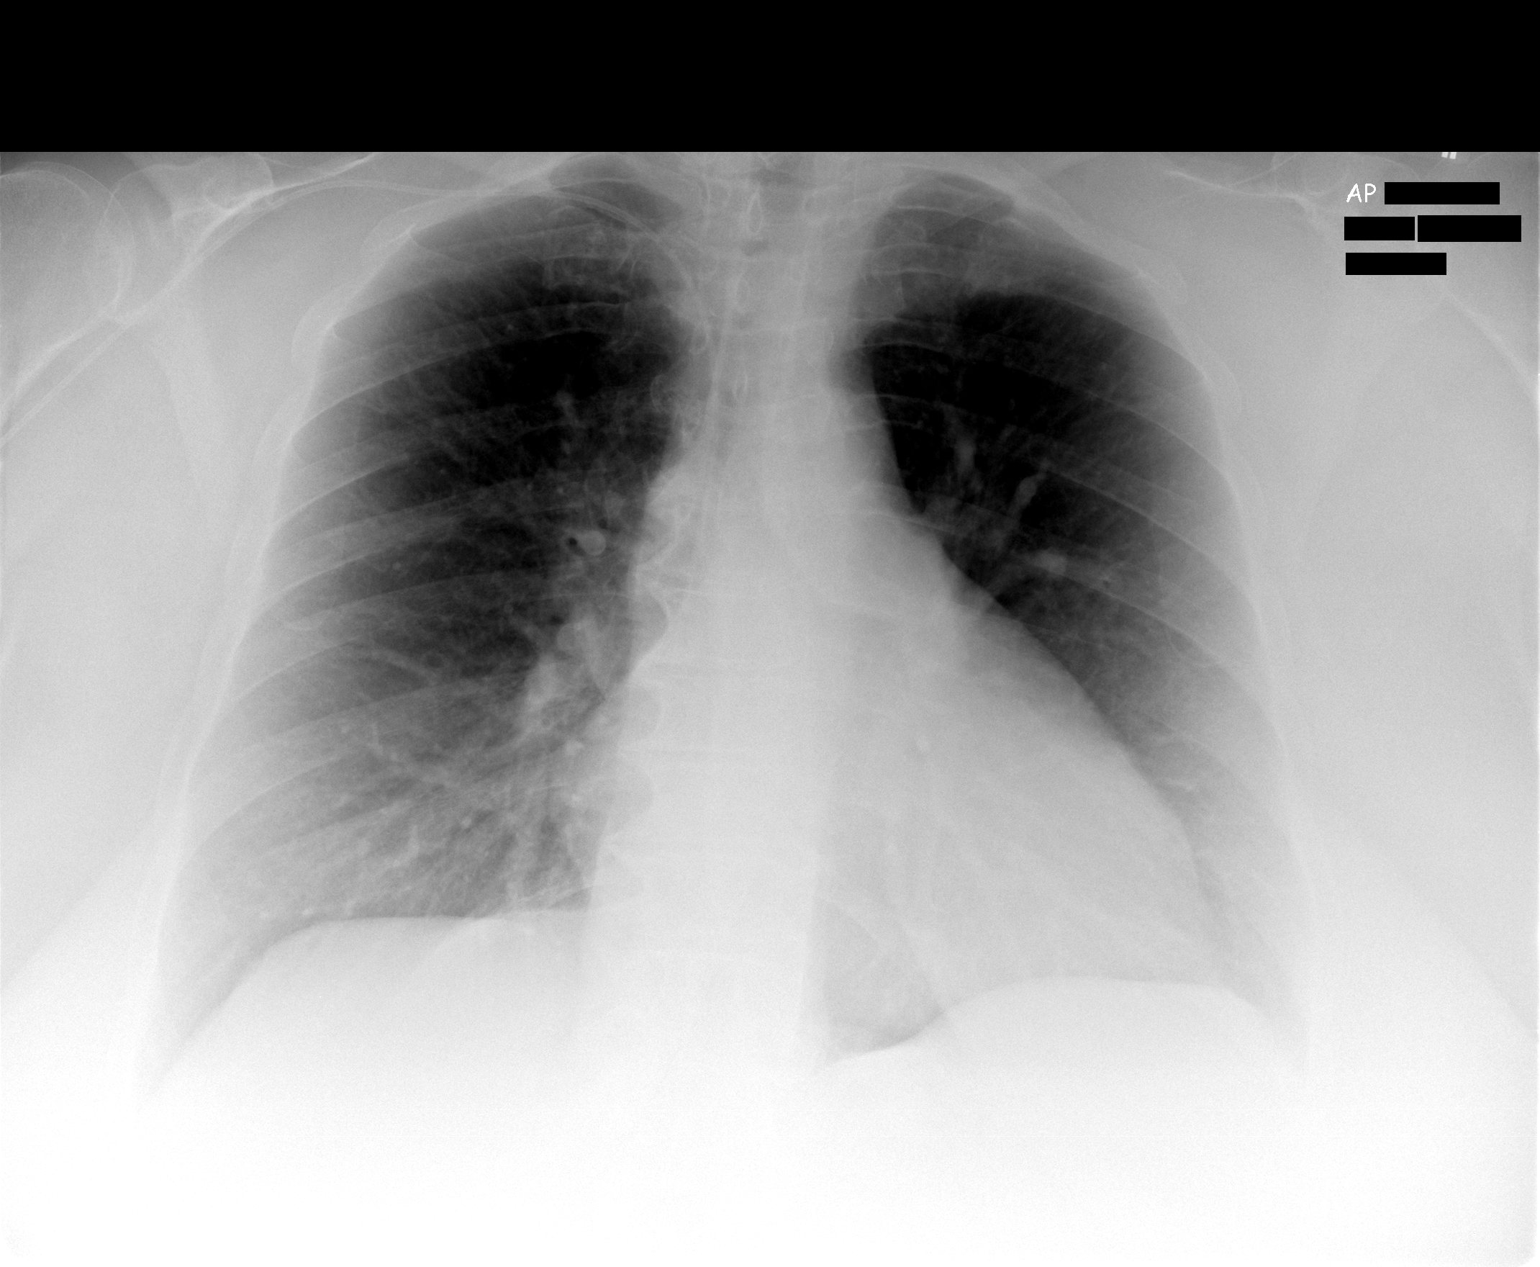

[1 of 1 positions shown; findings below may reference images not displayed]

FINDINGS: Right PICC is in place the tip in the superior vena cava.
No pneumothorax.  Lungs clear.  Heart size normal.
IMPRESSION: Right PICC in good position.  No acute abnormality.

## 2011-06-16 ENCOUNTER — Ambulatory Visit (HOSPITAL_COMMUNITY)
Admission: RE | Admit: 2011-06-16 | Discharge: 2011-06-16 | Disposition: A | Discharge status: Home / Self Care | Payer: Medicare HMO | Source: Ambulatory Visit | Attending: Internal Medicine | Admitting: Internal Medicine

## 2011-06-16 ENCOUNTER — Other Ambulatory Visit (HOSPITAL_COMMUNITY): Payer: Self-pay | Admitting: Internal Medicine

## 2011-06-16 DIAGNOSIS — Z13820 Encounter for screening for osteoporosis: Secondary | ICD-10-CM | POA: Insufficient documentation

## 2011-06-16 DIAGNOSIS — Z139 Encounter for screening, unspecified: Secondary | ICD-10-CM

## 2011-06-16 DIAGNOSIS — Z1231 Encounter for screening mammogram for malignant neoplasm of breast: Secondary | ICD-10-CM | POA: Insufficient documentation

## 2011-08-10 HISTORY — PX: OTHER SURGICAL HISTORY: SHX169

## 2011-08-26 HISTORY — PX: NM MYOVIEW LTD: HXRAD82

## 2011-08-27 ENCOUNTER — Encounter: Payer: Self-pay | Admitting: Orthopedic Surgery

## 2011-08-27 ENCOUNTER — Ambulatory Visit (INDEPENDENT_AMBULATORY_CARE_PROVIDER_SITE_OTHER): Payer: Medicare HMO | Admitting: Orthopedic Surgery

## 2011-08-27 VITALS — BP 110/64 | Ht 71.0 in | Wt 341.0 lb

## 2011-08-27 DIAGNOSIS — M25569 Pain in unspecified knee: Secondary | ICD-10-CM

## 2011-08-27 MED ORDER — HYDROCODONE-ACETAMINOPHEN 7.5-650 MG PO TABS: 1.0000 | ORAL_TABLET | Freq: Four times a day (QID) | ORAL | Status: AC | PRN

## 2011-08-27 NOTE — Patient Instructions (Signed)
You have received a steroid shot. 15% of patients experience increased pain at the injection site with in the next 24 hours. This is best treated with ice and tylenol extra strength 2 tabs every 8 hours. If you are still having pain please call the office.   Refill next hydrocodone with your primary care doctor

## 2011-08-27 NOTE — Progress Notes (Signed)
Patient ID: Samantha [name], female   DOB: 1946/04/30, 66 y.o.   MRN: [id number] Chief Complaint  Patient presents with  . Knee Pain    bilateral knee pain, requests injections    Knee  Injection Procedure Note  Pre-operative Diagnosis: left knee oa  Post-operative Diagnosis: same  Indications: pain  Anesthesia: ethyl chloride   Procedure Details   Verbal consent was obtained for the procedure. Time out was completed.The joint was prepped with alcohol, followed by  Ethyl chloride spray and A 20 gauge needle was inserted into the knee via lateral approach; 4ml 1% lidocaine and 1 ml of depomedrol  was then injected into the joint . The needle was removed and the area cleansed and dressed.  Complications:  None; patient tolerated the procedure well. Knee  Injection Procedure Note  Pre-operative Diagnosis: right knee oa  Post-operative Diagnosis: same  Indications: pain  Anesthesia: ethyl chloride   Procedure Details   Verbal consent was obtained for the procedure. Time out was completed.The joint was prepped with alcohol, followed by  Ethyl chloride spray and A 20 gauge needle was inserted into the knee via lateral approach; 4ml 1% lidocaine and 1 ml of depomedrol  was then injected into the joint . The needle was removed and the area cleansed and dressed.  Complications:  None; patient tolerated the procedure well.

## 2011-09-03 HISTORY — PX: OTHER SURGICAL HISTORY: SHX169

## 2011-12-01 ENCOUNTER — Encounter: Payer: Self-pay | Admitting: Vascular Surgery

## 2011-12-02 ENCOUNTER — Encounter: Payer: Self-pay | Admitting: Vascular Surgery

## 2011-12-02 ENCOUNTER — Ambulatory Visit (INDEPENDENT_AMBULATORY_CARE_PROVIDER_SITE_OTHER): Payer: Medicare HMO | Admitting: Vascular Surgery

## 2011-12-02 VITALS — BP 135/61 | HR 52 | Resp 18 | Ht 71.0 in | Wt 342.2 lb

## 2011-12-02 DIAGNOSIS — I70219 Atherosclerosis of native arteries of extremities with intermittent claudication, unspecified extremity: Secondary | ICD-10-CM

## 2011-12-02 DIAGNOSIS — Z48812 Encounter for surgical aftercare following surgery on the circulatory system: Secondary | ICD-10-CM

## 2011-12-02 NOTE — Progress Notes (Signed)
[facility]   Vascular Consult Note    Patient name: Samantha [name] MRN: [id number] DOB: 1946-05-09 Sex: female   Referred by: Thurston Hole  Reason for referral: Lower extremity pain  HISTORY OF PRESENT ILLNESS: The patient is a 66 year old white female with a long history of peripheral vascular occlusive disease. She's had prior stenting of her left superficial femoral artery by Dr. Alanda Amass in the remote past. She does have known right superficial femoral artery occlusion. She has been evaluated for bilateral knee pain with walking and is seen yesterday to rule out arterial component of this. She reports no pain at rest. She does report that she does a great deal of walking that she does have pain in both legs and this is most particularly the right lateral knee. He does not have any calf cramping or tightening or aching with walking. She did have a recent cortisone injection this has improved her symptoms. He has no history of rest pain and no history of tissue loss on her lower extremities. He does have a history of arthroscopic surgery on her right knee 2004 he did have trauma to her left calf with an occupational injury in 2003. He has no history of DVT.  Past Medical History  Diagnosis Date  . Brain aneurysm   . CHF (congestive heart failure)   . Neuropathy   . Diabetes mellitus   . Anemia   . Thyroid disease   . Arthritis   . Macular degeneration     Past Surgical History  Procedure Date  . Tonsillectomy   . Appendectomy   . Lower back   . Heel spurs   . Hammer toes   . Right knee sark   . Cerebral aneurysm repair     History   Social History  . Marital Status: Married    Spouse Name: N/A    Number of Children: N/A  . Years of Education: 12   Occupational History  . Not on file.   Social History Main Topics  . Smoking status: Former Smoker    Quit date: 06/02/2002  . Smokeless tobacco: Not on file  . Alcohol Use: No  .  Drug Use: No  . Sexually Active: Not on file   Other Topics Concern  . Not on file   Social History Narrative  . No narrative on file    Family History  Problem Relation Age of Onset  . Diabetes      family history   . Arthritis      family history     Allergies  Allergen Reactions  . Amoxicillin-Pot Clavulanate   . Amoxicillin-Pot Clavulanate   . Clarithromycin   . Lansoprazole   . Levofloxacin   . Metronidazole   . Sulfonamide Derivatives     Prior to Admission medications   Medication Sig Start Date End Date Taking? Authorizing Provider  ACTOS 30 MG tablet 30 mg daily.  07/26/10  Yes Historical Provider, MD  aspirin 81 MG tablet Take 81 mg by mouth daily. Takes 2 tablets daily.   Yes Historical Provider, MD  beta carotene w/minerals (OCUVITE) tablet Take 1 tablet by mouth daily. Takes 2 tablets twice a day.   Yes Historical Provider, MD  carvedilol (COREG) 25 MG tablet Take 25 mg by mouth 2 (two) times daily with a meal.     Yes Historical Provider, MD  Coenzyme Q10 (CO Q 10 PO) Take by mouth.  Yes Historical Provider, MD  ENALAPRIL MALEATE PO Take 10 mg by mouth daily.    Yes Historical Provider, MD  FOLIC ACID PO Take 1 mg by mouth. Takes 2 tablets daily in morning   Yes Historical Provider, MD  FUROSEMIDE PO Take 40 mg by mouth. Takes 2 tablets twice daily   Yes Historical Provider, MD  GABAPENTIN PO Take 100 mg by mouth 3 (three) times daily.    Yes Historical Provider, MD  GlipiZIDE (GLUCOTROL XL PO) Take 10 mg by mouth.    Yes Historical Provider, MD  IRON PO Take 365 mg by mouth. Takes 2 tablets at noon daily.   Yes Historical Provider, MD  METFORMIN HCL PO Take 500 mg by mouth.    Yes Historical Provider, MD  NIASPAN 500 MG CR tablet Takes 2 tablets daily. 08/29/10  Yes Historical Provider, MD  pravastatin (PRAVACHOL) 80 MG tablet  06/17/10  Yes Historical Provider, MD  SYNTHROID 125 MCG tablet  09/13/10  Yes Historical Provider, MD  traMADol (ULTRAM) 50 MG  tablet Take 50 mg by mouth 4 (four) times daily. Takes 2 tablets 4 times daily.   Yes Historical Provider, MD  ZETIA 10 MG tablet  08/29/10  Yes Historical Provider, MD  albuterol (PROVENTIL) 2 MG tablet  06/22/10   Historical Provider, MD     REVIEW OF SYSTEMS: Cardiovascular: No chest pain, chest pressure, palpitations, orthopnea, or dyspnea on exertion. No claudication or rest pain,  No history of DVT or phlebitis. Pulmonary: No productive cough, asthma or wheezing. Neurologic: No weakness, paresthesias, aphasia, or amaurosis. No dizziness. Hematologic: No bleeding problems or clotting disorders. Musculoskeletal: No joint pain or joint swelling. Gastrointestinal: No blood in stool or hematemesis Genitourinary: No dysuria or hematuria. Psychiatric:: No history of major depression. Integumentary: No rashes or ulcers. Constitutional: No fever or chills.  PHYSICAL EXAMINATION:  Filed Vitals:   12/02/11 1146  BP: 135/61  Pulse: 52  Resp: 18    General: The patient appears their stated age. Obese Pulmonary: There is a good air exchange bilaterally without wheezing or rales. Abdomen: Soft and non-tender  Musculoskeletal: There are no major deformities.  There is no significant extremity pain. Neurologic: No focal weakness or paresthesias are detected, Skin: There are no ulcer or rashes noted. He does have hemosiderin deposits in both calves. Psychiatric: The patient has normal affect. Cardiovascular: There is a regular rate and rhythm without significant murmur appreciated. Pulse status and her feet are noted for 1+ left dorsalis pedis pulse, no pulses in her right foot palpable Diagnostic Studies: Lower extremity arterial studies reveal ankle arm index of 0.56 on the right and 0.84 on the left.  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  Plan: Discuss this at length with the patient. I do not feel that her lateral knee pain is related to the right superficial artery occlusion. I would not recommend arteriogram for further evaluation. I did explain that this could be corrected with femoral-popliteal bypass but would not recommend this magnitude of procedure with no rest pain or tissue loss. This apparently has been stable for quite some time. She will continue her exercise program and will notify should she worsen have worsening symptoms  Ibeth Fahmy 7/2/201312:50 PM

## 2011-12-03 NOTE — Procedures (Unsigned)
LOWER EXTREMITY ARTERIAL DUPLEX  INDICATION:  Lower extremity claudication.  HISTORY: Diabetes:  Yes Cardiac:  No Hypertension:  Yes Smoking:  Previously Previous Surgery:  History of left superficial femoral artery stent in 2005 or 2006 per patient.  SINGLE LEVEL ARTERIAL EXAM                         RIGHT                LEFT Brachial: Anterior tibial: Posterior tibial: Peroneal: Ankle/Brachial Index:   0.56                 0.84 Toe/Brachial Index      0.43                 0.54  LOWER EXTREMITY ARTERIAL DUPLEX EXAM  DUPLEX: 1. Elevated velocity with ratio of 11.1 involving the right proximal     superficial femoral artery suggesting greater than 75% stenosis. 2. Functional occlusion of the right superficial femoral artery with     no color Doppler noted involving the right mid/distal superficial     femoral artery. 3. Elevated velocities present at the mid left superficial femoral     artery suggesting 50%-75% stenosis.  IMPRESSION: 1. 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 2. Native artery stenosis present bilaterally, as noted above. 3. Patent left superficial femoral artery stent with mildly elevated     velocities at the proximal anastomosis and mid-stent segments which     may be indicative of stenosis. 4. Right ankle brachial index in the moderate claudication range. 5. Left ankle brachial index is in the mild claudication range. 6. Bilateral toe brachial indices are in the claudicant range and     considered abnormal.      ___________________________________________ Larina Earthly, M.D.  SH/MEDQ  D:  12/02/2011  T:  12/02/2011  Job:  [id number]

## 2012-02-09 ENCOUNTER — Ambulatory Visit (INDEPENDENT_AMBULATORY_CARE_PROVIDER_SITE_OTHER): Payer: Medicare HMO | Admitting: Orthopedic Surgery

## 2012-02-09 ENCOUNTER — Encounter: Payer: Self-pay | Admitting: Orthopedic Surgery

## 2012-02-09 VITALS — BP 116/78 | Ht 71.0 in | Wt 346.0 lb

## 2012-02-09 DIAGNOSIS — M1710 Unilateral primary osteoarthritis, unspecified knee: Secondary | ICD-10-CM

## 2012-02-09 NOTE — Progress Notes (Signed)
Patient ID: Samantha [name], female   DOB: 1945/06/08, 66 y.o.   MRN: [id number]   Venous stasis disease had eval for this, ? .55 and .80 not sure if this is arterial stenosis or venous   Knee  Injection Procedure Note  Pre-operative Diagnosis: left knee oa  Post-operative Diagnosis: same  Indications: pain  Anesthesia: ethyl chloride   Procedure Details   Verbal consent was obtained for the procedure. Time out was completed.The joint was prepped with alcohol, followed by  Ethyl chloride spray and A 20 gauge needle was inserted into the knee via lateral approach; 4ml 1% lidocaine and 1 ml of depomedrol  was then injected into the joint . The needle was removed and the area cleansed and dressed.  Complications:  None; patient tolerated the procedure well.  Knee  Injection Procedure Note  Pre-operative Diagnosis: right knee oa  Post-operative Diagnosis: same  Indications: pain  Anesthesia: ethyl chloride   Procedure Details   Verbal consent was obtained for the procedure. Time out was completed.The joint was prepped with alcohol, followed by  Ethyl chloride spray and A 20 gauge needle was inserted into the knee via lateral approach; 4ml 1% lidocaine and 1 ml of depomedrol  was then injected into the joint . The needle was removed and the area cleansed and dressed.  Complications:  None; patient tolerated the procedure well.  3 months as needed

## 2012-02-09 NOTE — Patient Instructions (Addendum)
You have received a steroid shot. 15% of patients experience increased pain at the injection site with in the next 24 hours. This is best treated with ice and tylenol extra strength 2 tabs every 8 hours. If you are still having pain please call the office.    

## 2012-02-23 ENCOUNTER — Other Ambulatory Visit: Payer: Self-pay | Admitting: *Deleted

## 2012-02-23 MED ORDER — TRAMADOL HCL 50 MG PO TABS: 50.0000 mg | ORAL_TABLET | Freq: Four times a day (QID) | ORAL | Status: DC

## 2012-02-25 ENCOUNTER — Other Ambulatory Visit (HOSPITAL_COMMUNITY): Payer: Self-pay | Admitting: Internal Medicine

## 2012-02-25 DIAGNOSIS — M549 Dorsalgia, unspecified: Secondary | ICD-10-CM

## 2012-02-27 ENCOUNTER — Ambulatory Visit (HOSPITAL_COMMUNITY)
Admission: RE | Admit: 2012-02-27 | Discharge: 2012-02-27 | Disposition: A | Discharge status: Home / Self Care | Payer: Medicare HMO | Source: Ambulatory Visit | Attending: Internal Medicine | Admitting: Internal Medicine

## 2012-02-27 DIAGNOSIS — M5137 Other intervertebral disc degeneration, lumbosacral region: Secondary | ICD-10-CM | POA: Insufficient documentation

## 2012-02-27 DIAGNOSIS — M51379 Other intervertebral disc degeneration, lumbosacral region without mention of lumbar back pain or lower extremity pain: Secondary | ICD-10-CM | POA: Insufficient documentation

## 2012-02-27 DIAGNOSIS — M545 Low back pain, unspecified: Secondary | ICD-10-CM | POA: Insufficient documentation

## 2012-02-27 DIAGNOSIS — M549 Dorsalgia, unspecified: Secondary | ICD-10-CM

## 2012-06-08 ENCOUNTER — Ambulatory Visit (INDEPENDENT_AMBULATORY_CARE_PROVIDER_SITE_OTHER): Payer: Medicare Other | Admitting: Orthopedic Surgery

## 2012-06-08 VITALS — BP 160/80 | Ht 71.0 in | Wt 386.0 lb

## 2012-06-08 DIAGNOSIS — M1710 Unilateral primary osteoarthritis, unspecified knee: Secondary | ICD-10-CM

## 2012-06-08 NOTE — Progress Notes (Signed)
Patient ID: Samantha [name], female   DOB: July 18, 1945, 67 y.o.   MRN: [id number] Chief Complaint  Patient presents with  . Knee Pain    bilateral knee pain    BP 160/80  Ht 5\' 11"  (1.803 m)  Wt 386 lb (175.088 kg)  BMI 53.84 kg/m2   Knee  Injection Procedure Note  Pre-operative Diagnosis: left knee oa  Post-operative Diagnosis: same  Indications: pain  Anesthesia: ethyl chloride   Procedure Details   Verbal consent was obtained for the procedure. Time out was completed.The joint was prepped with alcohol, followed by  Ethyl chloride spray and A 20 gauge needle was inserted into the knee via lateral approach; 4ml 1% lidocaine and 1 ml of depomedrol  was then injected into the joint . The needle was removed and the area cleansed and dressed.  Complications:  None; patient tolerated the procedure well.  Knee  Injection Procedure Note  Pre-operative Diagnosis: right knee oa  Post-operative Diagnosis: same  Indications: pain  Anesthesia: ethyl chloride   Procedure Details   Verbal consent was obtained for the procedure. Time out was completed.The joint was prepped with alcohol, followed by  Ethyl chloride spray and A 20 gauge needle was inserted into the knee via lateral approach; 4ml 1% lidocaine and 1 ml of depomedrol  was then injected into the joint . The needle was removed and the area cleansed and dressed.  Complications:  None; patient tolerated the procedure well.

## 2012-06-08 NOTE — Patient Instructions (Signed)
You have received a steroid shot. 15% of patients experience increased pain at the injection site with in the next 24 hours. This is best treated with ice and tylenol extra strength 2 tabs every 8 hours. If you are still having pain please call the office.    

## 2012-08-03 ENCOUNTER — Other Ambulatory Visit (HOSPITAL_COMMUNITY): Payer: Self-pay | Admitting: Internal Medicine

## 2012-08-03 DIAGNOSIS — Z139 Encounter for screening, unspecified: Secondary | ICD-10-CM

## 2012-08-09 ENCOUNTER — Ambulatory Visit (HOSPITAL_COMMUNITY)
Admission: RE | Admit: 2012-08-09 | Discharge: 2012-08-09 | Disposition: A | Discharge status: Home / Self Care | Payer: Medicare Other | Source: Ambulatory Visit | Attending: Internal Medicine | Admitting: Internal Medicine

## 2012-08-09 DIAGNOSIS — Z139 Encounter for screening, unspecified: Secondary | ICD-10-CM

## 2012-08-09 DIAGNOSIS — Z1231 Encounter for screening mammogram for malignant neoplasm of breast: Secondary | ICD-10-CM | POA: Insufficient documentation

## 2012-09-30 ENCOUNTER — Ambulatory Visit (INDEPENDENT_AMBULATORY_CARE_PROVIDER_SITE_OTHER): Payer: Medicare Other | Admitting: Orthopedic Surgery

## 2012-09-30 VITALS — BP 138/74 | Ht 71.0 in | Wt 386.0 lb

## 2012-09-30 DIAGNOSIS — M1710 Unilateral primary osteoarthritis, unspecified knee: Secondary | ICD-10-CM

## 2012-09-30 DIAGNOSIS — M129 Arthropathy, unspecified: Secondary | ICD-10-CM

## 2012-09-30 NOTE — Patient Instructions (Signed)
Knee Injection  Joint injections are shots. Your caregiver will place a needle into your knee joint. The needle is used to put medicine into the joint. These shots can be used to help treat different painful knee conditions such as osteoarthritis, bursitis, local flare-ups of rheumatoid arthritis, and pseudogout. Anti-inflammatory medicines such as corticosteroids and anesthetics are the most common medicines used for joint and soft tissue injections.   PROCEDURE  · The skin over the kneecap will be cleaned with an antiseptic solution.  · Your caregiver will inject a small amount of a local anesthetic (a medicine like Novocaine) just under the skin in the area that was cleaned.  · After the area becomes numb, a second injection is done. This second injection usually includes an anesthetic and an anti-inflammatory medicine called a steroid or cortisone. The needle is carefully placed in between the kneecap and the knee, and the medicine is injected into the joint space.  · After the injection is done, the needle is removed. Your caregiver may place a bandage over the injection site. The whole procedure takes no more than a couple of minutes.  BEFORE THE PROCEDURE   Wash all of the skin around the entire knee area. Try to remove any loose, scaling skin. There is no other specific preparation necessary unless advised otherwise by your caregiver.  LET YOUR CAREGIVER KNOW ABOUT:   · Allergies.  · Medications taken including herbs, eye drops, over the counter medications, and creams.  · Use of steroids (by mouth or creams).  · Possible pregnancy, if applicable.  · Previous problems with anesthetics or Novocaine.  · History of blood clots (thrombophlebitis).  · History of bleeding or blood problems.  · Previous surgery.  · Other health problems.  RISKS AND COMPLICATIONS  Side effects from cortisone shots are rare. They include:   · Slight bruising of the skin.  · Shrinkage of the normal fatty tissue under the skin where  the shot was given.  · Increase in pain after the shot.  · Infection.  · Weakening of tendons or tendon rupture.  · Allergic reaction to the medicine.  · Diabetics may have a temporary increase in their blood sugar after a shot.  · Cortisone can temporarily weaken the immune system. While receiving these shots, you should not get certain vaccines. Also, avoid contact with anyone who has chickenpox or measles. Especially if you have never had these diseases or have not been previously immunized. Your immune system may not be strong enough to fight off the infection while the cortisone is in your system.  AFTER THE PROCEDURE   · You can go home after the procedure.  · You may need to put ice on the joint 15 to 20 minutes every 3 or 4 hours until the pain goes away.  · You may need to put an elastic bandage on the joint.  HOME CARE INSTRUCTIONS   · Only take over-the-counter or prescription medicines for pain, discomfort, or fever as directed by your caregiver.  · You should avoid stressing the joint. Unless advised otherwise, avoid activities that put a lot of pressure on a knee joint, such as:  · Jogging.  · Bicycling.  · Recreational climbing.  · Hiking.  · Laying down and elevating the leg/knee above the level of your heart can help to minimize swelling.  SEEK MEDICAL CARE IF:   · You have repeated or worsening swelling.  · There is drainage from the puncture area.  ·   You develop red streaking that extends above or below the site where the needle was inserted.  SEEK IMMEDIATE MEDICAL CARE IF:   · You develop a fever.  · You have pain that gets worse even though you are taking pain medicine.  · The area is red and warm, and you have trouble moving the joint.  MAKE SURE YOU:   · Understand these instructions.  · Will watch your condition.  · Will get help right away if you are not doing well or get worse.  Document Released: 08/10/2006 Document Revised: 08/11/2011 Document Reviewed: 05/07/2007  [employer]® Patient  Information ©2013 [employer].

## 2012-09-30 NOTE — Progress Notes (Signed)
Patient ID: Samantha [name], female   DOB: 01-30-1946, 67 y.o.   MRN: [id number] Chief Complaint  Patient presents with  . Follow-up    Recheck bilateral knees with injections.    Bilateral knee injections   Knee  Injection Procedure Note  Pre-operative Diagnosis: left knee oa  Post-operative Diagnosis: same  Indications: pain  Anesthesia: ethyl chloride   Procedure Details   Verbal consent was obtained for the procedure. Time out was completed.The joint was prepped with alcohol, followed by  Ethyl chloride spray and A 20 gauge needle was inserted into the knee via lateral approach; 4ml 1% lidocaine and 1 ml of depomedrol  was then injected into the joint . The needle was removed and the area cleansed and dressed.  Complications:  None; patient tolerated the procedure well. Knee  Injection Procedure Note  Pre-operative Diagnosis: right knee oa  Post-operative Diagnosis: same  Indications: pain  Anesthesia: ethyl chloride   Procedure Details   Verbal consent was obtained for the procedure. Time out was completed.The joint was prepped with alcohol, followed by  Ethyl chloride spray and A 20 gauge needle was inserted into the knee via lateral approach; 4ml 1% lidocaine and 1 ml of depomedrol  was then injected into the joint . The needle was removed and the area cleansed and dressed.  Complications:  None; patient tolerated the procedure well.

## 2012-10-05 ENCOUNTER — Ambulatory Visit (HOSPITAL_COMMUNITY)
Admission: RE | Admit: 2012-10-05 | Discharge: 2012-10-05 | Disposition: A | Discharge status: Home / Self Care | Payer: Medicare Other | Source: Ambulatory Visit | Attending: Cardiovascular Disease | Admitting: Cardiovascular Disease

## 2012-10-05 DIAGNOSIS — R011 Cardiac murmur, unspecified: Secondary | ICD-10-CM | POA: Insufficient documentation

## 2012-10-05 DIAGNOSIS — R0609 Other forms of dyspnea: Secondary | ICD-10-CM | POA: Insufficient documentation

## 2012-10-05 DIAGNOSIS — R0989 Other specified symptoms and signs involving the circulatory and respiratory systems: Secondary | ICD-10-CM | POA: Insufficient documentation

## 2012-10-05 HISTORY — PX: TRANSTHORACIC ECHOCARDIOGRAM: SHX275

## 2012-10-05 NOTE — Progress Notes (Signed)
*  PRELIMINARY RESULTS* Echocardiogram 2D Echocardiogram has been performed.  Samantha [name] 10/05/2012, 2:16 PM

## 2012-10-22 ENCOUNTER — Telehealth: Payer: Self-pay | Admitting: Cardiovascular Disease

## 2012-10-22 NOTE — Telephone Encounter (Signed)
called pt to schedule LEA but no answer

## 2012-10-26 ENCOUNTER — Encounter: Payer: Self-pay | Admitting: Cardiovascular Disease

## 2012-10-26 NOTE — Telephone Encounter (Signed)
Pt. Stated that she was going to call today and set up test.

## 2012-11-11 ENCOUNTER — Ambulatory Visit (HOSPITAL_COMMUNITY)
Admission: RE | Admit: 2012-11-11 | Discharge: 2012-11-11 | Disposition: A | Discharge status: Home / Self Care | Payer: Medicare Other | Source: Ambulatory Visit | Attending: Cardiovascular Disease | Admitting: Cardiovascular Disease

## 2012-11-11 ENCOUNTER — Other Ambulatory Visit (HOSPITAL_COMMUNITY): Payer: Self-pay | Admitting: Cardiovascular Disease

## 2012-11-11 DIAGNOSIS — I70219 Atherosclerosis of native arteries of extremities with intermittent claudication, unspecified extremity: Secondary | ICD-10-CM | POA: Insufficient documentation

## 2012-11-11 HISTORY — PX: OTHER SURGICAL HISTORY: SHX169

## 2012-11-11 NOTE — Progress Notes (Signed)
Lower Extremity Arterial Duplex Completed. °Samantha [name] ° °

## 2012-11-25 ENCOUNTER — Other Ambulatory Visit: Payer: Self-pay

## 2012-11-25 MED ORDER — PRAVASTATIN SODIUM 80 MG PO TABS: 80.0000 mg | ORAL_TABLET | Freq: Every day | ORAL | Status: AC

## 2012-11-25 NOTE — Telephone Encounter (Signed)
Rx was sent to pharmacy electronically via [employer].  

## 2012-12-22 ENCOUNTER — Telehealth: Payer: Self-pay | Admitting: Orthopedic Surgery

## 2012-12-22 NOTE — Telephone Encounter (Signed)
Patient called to cancel upcoming appointment for 01/04/13; offered to reschedule; elects to not re-schedule at this time.

## 2013-01-04 ENCOUNTER — Ambulatory Visit: Payer: Medicare Other | Admitting: Orthopedic Surgery

## 2013-01-04 ENCOUNTER — Other Ambulatory Visit: Payer: Self-pay | Admitting: Cardiovascular Disease

## 2013-01-04 LAB — CBC WITH DIFFERENTIAL/PLATELET
Basophils Relative: 0 % (ref 0–1)
Eosinophils Absolute: 0.2 10*3/uL (ref 0.0–0.7)
Eosinophils Relative: 2 % (ref 0–5)
Hemoglobin: 11.3 g/dL — ABNORMAL LOW (ref 12.0–15.0)
Lymphs Abs: 2.7 10*3/uL (ref 0.7–4.0)
MCH: 28.3 pg (ref 26.0–34.0)
MCHC: 33.7 g/dL (ref 30.0–36.0)
MCV: 84 fL (ref 78.0–100.0)
Monocytes Relative: 8 % (ref 3–12)
Neutrophils Relative %: 60 % (ref 43–77)
Platelets: 281 10*3/uL (ref 150–400)
RBC: 3.99 MIL/uL (ref 3.87–5.11)

## 2013-01-04 LAB — COMPREHENSIVE METABOLIC PANEL
Alkaline Phosphatase: 65 U/L (ref 39–117)
CO2: 30 mEq/L (ref 19–32)
Creat: 0.83 mg/dL (ref 0.50–1.10)
Glucose, Bld: 125 mg/dL — ABNORMAL HIGH (ref 70–99)
Sodium: 140 mEq/L (ref 135–145)
Total Bilirubin: 0.6 mg/dL (ref 0.3–1.2)
Total Protein: 6.5 g/dL (ref 6.0–8.3)

## 2013-01-06 ENCOUNTER — Encounter: Payer: Self-pay | Admitting: Cardiovascular Disease

## 2013-03-11 ENCOUNTER — Other Ambulatory Visit: Payer: Self-pay | Admitting: *Deleted

## 2013-03-11 MED ORDER — ENALAPRIL MALEATE 10 MG PO TABS: 10.0000 mg | ORAL_TABLET | Freq: Every day | ORAL | Status: DC

## 2013-06-14 ENCOUNTER — Other Ambulatory Visit: Payer: Self-pay | Admitting: *Deleted

## 2013-06-14 MED ORDER — EZETIMIBE 10 MG PO TABS: 10.0000 mg | ORAL_TABLET | Freq: Every day | ORAL | Status: DC

## 2013-06-16 ENCOUNTER — Other Ambulatory Visit: Payer: Self-pay | Admitting: *Deleted

## 2013-07-04 ENCOUNTER — Telehealth: Payer: Self-pay | Admitting: *Deleted

## 2013-07-04 MED ORDER — FUROSEMIDE 40 MG PO TABS: 40.0000 mg | ORAL_TABLET | Freq: Two times a day (BID) | ORAL | Status: DC

## 2013-07-04 NOTE — Telephone Encounter (Signed)
Pt needs a refill on Furosemide and she stated that [facility] sent over the request last week and she has not heard back that it has been filled.  RAW

## 2013-07-04 NOTE — Telephone Encounter (Signed)
Rx was sent to pharmacy electronically. 

## 2013-08-10 ENCOUNTER — Encounter: Payer: Self-pay | Admitting: Cardiovascular Disease

## 2013-08-10 ENCOUNTER — Ambulatory Visit (INDEPENDENT_AMBULATORY_CARE_PROVIDER_SITE_OTHER): Payer: Commercial Managed Care - HMO | Admitting: Cardiovascular Disease

## 2013-08-10 VITALS — BP 134/72 | HR 86 | Ht 71.0 in | Wt 307.0 lb

## 2013-08-10 DIAGNOSIS — E785 Hyperlipidemia, unspecified: Secondary | ICD-10-CM

## 2013-08-10 DIAGNOSIS — I70219 Atherosclerosis of native arteries of extremities with intermittent claudication, unspecified extremity: Secondary | ICD-10-CM

## 2013-08-10 DIAGNOSIS — Z79899 Other long term (current) drug therapy: Secondary | ICD-10-CM

## 2013-08-10 DIAGNOSIS — R079 Chest pain, unspecified: Secondary | ICD-10-CM

## 2013-08-10 DIAGNOSIS — I10 Essential (primary) hypertension: Secondary | ICD-10-CM

## 2013-08-10 DIAGNOSIS — I739 Peripheral vascular disease, unspecified: Secondary | ICD-10-CM

## 2013-08-10 MED ORDER — NIACIN ER (ANTIHYPERLIPIDEMIC) 500 MG PO TBCR: 500.0000 mg | EXTENDED_RELEASE_TABLET | Freq: Two times a day (BID) | ORAL | Status: DC

## 2013-08-10 NOTE — Assessment & Plan Note (Signed)
Patient has a history of remote left SFA PTA and stenting for short segment chronic total occlusion with failed attempt at right SFA intervention. Dopplers continue to show an occluded right SFA with an ABI of 0.60 and a patent left SFA stent there is moderate in-stent restenosis with an ABI of 0.91. She denies claudication. Her major limitation stimulation related to arthritis in her knees.

## 2013-08-10 NOTE — Assessment & Plan Note (Signed)
Controlled on current medications 

## 2013-08-10 NOTE — Assessment & Plan Note (Signed)
On statin therapy. We will recheck a lipid and liver profile 

## 2013-08-10 NOTE — Progress Notes (Signed)
08/10/2013 Samantha [name]   March 14, 1946  [id number]  Primary Physician Cassell Smiles., MD Primary Cardiologist: Runell Gess MD Roseanne Reno   HPI:  Ms. Samantha [name] is a pleasant 68 year old severely overweight married Caucasian female mother of one, grandmother and one grandchild formally patient Dr. Rocco Serene who's care I am assuming.she has a history of essentially normal coronary arteries normal LV function by cath in 2004. She has had her left SFA intervened on successfully and her right unsuccessfully in the past. Thought that the ultrasound confirming continued patency of the left SFA stent as recently as June of last year. She really denies claudication. Factors include remote tobacco abuse having quit 11 years ago and smoked over 60 pack years. History of hypertension and hyperlipidemia as well as non-insulin-dependent diabetes. She does complain of several episodes of chest pain which are new over the last 6 months ago somewhat concerning for angina. She had a negative Myoview stress test 08/26/11 with breast attenuation artifact.   Current Outpatient Prescriptions  Medication Sig Dispense Refill  . aspirin 81 MG tablet Take 81 mg by mouth daily. Takes 2 tablets daily.      . beta carotene w/minerals (OCUVITE) tablet Take 1 tablet by mouth daily. Takes 2 tablets twice a day.      . carvedilol (COREG) 25 MG tablet Take 25 mg by mouth 2 (two) times daily with a meal.        . Coenzyme Q10 (CO Q 10 PO) Take by mouth.        . enalapril (VASOTEC) 10 MG tablet Take 1 tablet (10 mg total) by mouth daily.  30 tablet  11  . ezetimibe (ZETIA) 10 MG tablet Take 1 tablet (10 mg total) by mouth daily.  30 tablet  6  . FOLIC ACID PO Take 1 mg by mouth. Takes 2 tablets daily in morning      . furosemide (LASIX) 40 MG tablet Take 1 tablet (40 mg total) by mouth 2 (two) times daily.  180 tablet  1  . GABAPENTIN PO Take 100 mg by mouth 3 (three) times daily.       . GlipiZIDE  (GLUCOTROL XL PO) Take 5 mg by mouth.       . IRON PO Take 365 mg by mouth. Takes 2 tablets at noon daily.      Marland Kitchen METFORMIN HCL PO Take 500 mg by mouth 3 (three) times daily.       . niacin (NIASPAN) 500 MG CR tablet Take 1 tablet (500 mg total) by mouth 2 (two) times daily.  90 tablet  3  . pravastatin (PRAVACHOL) 80 MG tablet Take 1 tablet (80 mg total) by mouth daily.  90 tablet  3  . SYNTHROID 125 MCG tablet       . traMADol (ULTRAM) 50 MG tablet Take 1 tablet (50 mg total) by mouth 4 (four) times daily. Takes 2 tablets 4 times daily.  90 tablet  5  . HYDROcodone-acetaminophen (NORCO) 7.5-325 MG per tablet Take 1 tablet by mouth every 6 (six) hours as needed.       Current Facility-Administered Medications  Medication Dose Route Frequency Provider Last Rate Last Dose  . methylPREDNISolone acetate (DEPO-MEDROL) injection 40 mg  40 mg Intra-articular Once Vickki Hearing, MD      . methylPREDNISolone acetate (DEPO-MEDROL) injection 40 mg  40 mg Intra-articular Once Vickki Hearing, MD        Allergies  Allergen Reactions  .  Amoxicillin-Pot Clavulanate   . Amoxicillin-Pot Clavulanate   . Clarithromycin   . Lansoprazole   . Levofloxacin   . Metronidazole   . Sulfonamide Derivatives     History   Social History  . Marital Status: Married    Spouse Name: N/A    Number of Children: N/A  . Years of Education: 12   Occupational History  . Not on file.   Social History Main Topics  . Smoking status: Former Smoker    Quit date: 06/02/2002  . Smokeless tobacco: Not on file  . Alcohol Use: No  . Drug Use: No  . Sexual Activity: Not on file   Other Topics Concern  . Not on file   Social History Narrative  . No narrative on file     Review of Systems: General: negative for chills, fever, night sweats or weight changes.  Cardiovascular: negative for chest pain, dyspnea on exertion, edema, orthopnea, palpitations, paroxysmal nocturnal dyspnea or shortness of  breath Dermatological: negative for rash Respiratory: negative for cough or wheezing Urologic: negative for hematuria Abdominal: negative for nausea, vomiting, diarrhea, bright red blood per rectum, melena, or hematemesis Neurologic: negative for visual changes, syncope, or dizziness All other systems reviewed and are otherwise negative except as noted above.    Blood pressure 134/72, pulse 86, height 5\' 11"  (1.803 m), weight 307 lb (139.254 kg).  General appearance: alert and no distress Neck: no adenopathy, no carotid bruit, no JVD, supple, symmetrical, trachea midline and thyroid not enlarged, symmetric, no tenderness/mass/nodules Lungs: clear to auscultation bilaterally Heart: regular rate and rhythm, S1, S2 normal, no murmur, click, rub or gallop Extremities: extremities normal, atraumatic, no cyanosis or edema and absent right pedal pulse, 1-2+ left pedal pulse  EKG normal sinus rhythm at 86 without ST or T wave changes  ASSESSMENT AND PLAN:   Atherosclerosis of native arteries of the extremities with intermittent claudication Patient has a history of remote left SFA PTA and stenting for short segment chronic total occlusion with failed attempt at right SFA intervention. Dopplers continue to show an occluded right SFA with an ABI of 0.60 and a patent left SFA stent there is moderate in-stent restenosis with an ABI of 0.91. She denies claudication. Her major limitation stimulation related to arthritis in her knees.  Essential hypertension Controlled on current medications  Hyperlipidemia On statin therapy. We will recheck a lipid and liver profile      Runell GessJonathan J. Berry MD [facility] Insitute HospitalFACP,FACC,FAHA, [facility] RehabilitationFSCAI 08/10/2013 10:47 AM

## 2013-08-10 NOTE — Patient Instructions (Signed)
Your physician has requested that you have a lexiscan myoview. For further information please visit [email or web address]. Please follow instruction sheet, as given.  Your physician has requested that you have a lower extremity arterial duplex. This test is an ultrasound of the arteries in the legs. It looks at arterial blood flow in the legs. Allow one hour for Lower Arterial scans. There are no restrictions or special instructions ** Please schedule for June 2015  Your physician recommends that you return for lab work in: a few days to a week - you will need to be fasting (nothing to eat/drink after midnight.   Your physician wants you to follow-up in: 6 months with an extender, 12 months with Dr. Allyson SabalBerry. You will receive a reminder letter in the mail two months in advance. If you don't receive a letter, please call our office to schedule the follow-up appointment.

## 2013-08-11 LAB — HEPATIC FUNCTION PANEL
ALBUMIN: 3.9 g/dL (ref 3.5–5.2)
ALT: 17 U/L (ref 0–35)
AST: 12 U/L (ref 0–37)
Alkaline Phosphatase: 69 U/L (ref 39–117)
BILIRUBIN DIRECT: 0.1 mg/dL (ref 0.0–0.3)
BILIRUBIN TOTAL: 0.6 mg/dL (ref 0.2–1.2)
Indirect Bilirubin: 0.5 mg/dL (ref 0.2–1.2)
TOTAL PROTEIN: 6.8 g/dL (ref 6.0–8.3)

## 2013-08-11 LAB — LIPID PANEL
Cholesterol: 173 mg/dL (ref 0–200)
HDL: 54 mg/dL (ref >39)
LDL Cholesterol: 95 mg/dL (ref 0–99)
Total CHOL/HDL Ratio: 3.2 Ratio
Triglycerides: 120 mg/dL (ref <150)
VLDL: 24 mg/dL (ref 0–40)

## 2013-08-15 ENCOUNTER — Encounter: Payer: Self-pay | Admitting: *Deleted

## 2013-08-24 ENCOUNTER — Encounter (HOSPITAL_COMMUNITY): Payer: Medicare Other

## 2013-08-25 ENCOUNTER — Encounter (HOSPITAL_COMMUNITY): Payer: Medicare Other

## 2013-09-29 ENCOUNTER — Telehealth (HOSPITAL_COMMUNITY): Payer: Self-pay

## 2013-10-04 ENCOUNTER — Encounter (HOSPITAL_COMMUNITY): Payer: Medicare Other

## 2013-10-17 ENCOUNTER — Ambulatory Visit (HOSPITAL_COMMUNITY)
Admission: RE | Admit: 2013-10-17 | Discharge: 2013-10-17 | Disposition: A | Discharge status: Home / Self Care | Payer: Medicare HMO | Source: Ambulatory Visit | Attending: Internal Medicine | Admitting: Internal Medicine

## 2013-10-17 ENCOUNTER — Encounter (INDEPENDENT_AMBULATORY_CARE_PROVIDER_SITE_OTHER): Payer: Self-pay

## 2013-10-17 ENCOUNTER — Other Ambulatory Visit (HOSPITAL_COMMUNITY): Payer: Self-pay | Admitting: Internal Medicine

## 2013-10-17 DIAGNOSIS — R059 Cough, unspecified: Secondary | ICD-10-CM

## 2013-10-17 DIAGNOSIS — R05 Cough: Secondary | ICD-10-CM

## 2013-10-17 DIAGNOSIS — R062 Wheezing: Secondary | ICD-10-CM | POA: Insufficient documentation

## 2013-10-17 DIAGNOSIS — R0989 Other specified symptoms and signs involving the circulatory and respiratory systems: Secondary | ICD-10-CM | POA: Insufficient documentation

## 2013-11-01 ENCOUNTER — Ambulatory Visit (HOSPITAL_COMMUNITY)
Admission: RE | Admit: 2013-11-01 | Discharge: 2013-11-01 | Disposition: A | Discharge status: Home / Self Care | Payer: Medicare HMO | Source: Ambulatory Visit | Attending: Internal Medicine | Admitting: Internal Medicine

## 2013-11-01 DIAGNOSIS — I739 Peripheral vascular disease, unspecified: Secondary | ICD-10-CM | POA: Insufficient documentation

## 2013-11-01 DIAGNOSIS — I70219 Atherosclerosis of native arteries of extremities with intermittent claudication, unspecified extremity: Secondary | ICD-10-CM

## 2013-11-01 NOTE — Progress Notes (Signed)
Arterial Duplex Lower Ext. Completed. Tavien Chestnut, BS, RDMS, RVT  

## 2013-11-11 ENCOUNTER — Encounter: Payer: Self-pay | Admitting: *Deleted

## 2013-11-14 ENCOUNTER — Telehealth: Payer: Self-pay | Admitting: Cardiovascular Disease

## 2013-11-14 NOTE — Telephone Encounter (Signed)
Pt would like to know if her doppler results are ready from 11-01-13 please.

## 2013-11-14 NOTE — Telephone Encounter (Signed)
Results of doppler given to patient.  No change from previous study.  Voiced understanding. Results have been mailed and I apologized she has not received them.

## 2013-11-18 ENCOUNTER — Telehealth (HOSPITAL_COMMUNITY): Payer: Self-pay

## 2013-11-18 ENCOUNTER — Telehealth (HOSPITAL_COMMUNITY): Payer: Self-pay | Admitting: *Deleted

## 2013-11-23 ENCOUNTER — Encounter (HOSPITAL_COMMUNITY): Payer: Medicare HMO

## 2013-11-30 ENCOUNTER — Telehealth (HOSPITAL_COMMUNITY): Payer: Self-pay

## 2013-11-30 DIAGNOSIS — Z9889 Other specified postprocedural states: Secondary | ICD-10-CM

## 2013-11-30 HISTORY — DX: Other specified postprocedural states: Z98.890

## 2013-11-30 NOTE — Telephone Encounter (Signed)
Encounter complete. 

## 2013-12-01 ENCOUNTER — Telehealth (HOSPITAL_COMMUNITY): Payer: Self-pay

## 2013-12-01 NOTE — Telephone Encounter (Signed)
Pt given detailed instructions for NUC MPI on Tuesday. Pt did RBV same.  

## 2013-12-06 ENCOUNTER — Ambulatory Visit (HOSPITAL_COMMUNITY)
Admission: RE | Admit: 2013-12-06 | Discharge: 2013-12-06 | Disposition: A | Discharge status: Home / Self Care | Payer: Medicare HMO | Source: Ambulatory Visit | Attending: Cardiovascular Disease | Admitting: Cardiovascular Disease

## 2013-12-06 VITALS — Ht 71.0 in | Wt 307.0 lb

## 2013-12-06 DIAGNOSIS — R0989 Other specified symptoms and signs involving the circulatory and respiratory systems: Secondary | ICD-10-CM | POA: Insufficient documentation

## 2013-12-06 DIAGNOSIS — Z794 Long term (current) use of insulin: Secondary | ICD-10-CM | POA: Insufficient documentation

## 2013-12-06 DIAGNOSIS — I10 Essential (primary) hypertension: Secondary | ICD-10-CM | POA: Insufficient documentation

## 2013-12-06 DIAGNOSIS — I739 Peripheral vascular disease, unspecified: Secondary | ICD-10-CM | POA: Insufficient documentation

## 2013-12-06 DIAGNOSIS — E785 Hyperlipidemia, unspecified: Secondary | ICD-10-CM

## 2013-12-06 DIAGNOSIS — R0609 Other forms of dyspnea: Secondary | ICD-10-CM | POA: Insufficient documentation

## 2013-12-06 DIAGNOSIS — I509 Heart failure, unspecified: Secondary | ICD-10-CM | POA: Insufficient documentation

## 2013-12-06 DIAGNOSIS — E119 Type 2 diabetes mellitus without complications: Secondary | ICD-10-CM | POA: Insufficient documentation

## 2013-12-06 DIAGNOSIS — Z8249 Family history of ischemic heart disease and other diseases of the circulatory system: Secondary | ICD-10-CM | POA: Insufficient documentation

## 2013-12-06 DIAGNOSIS — Z87891 Personal history of nicotine dependence: Secondary | ICD-10-CM | POA: Insufficient documentation

## 2013-12-06 DIAGNOSIS — R079 Chest pain, unspecified: Secondary | ICD-10-CM

## 2013-12-06 DIAGNOSIS — E669 Obesity, unspecified: Secondary | ICD-10-CM | POA: Insufficient documentation

## 2013-12-06 DIAGNOSIS — R42 Dizziness and giddiness: Secondary | ICD-10-CM | POA: Insufficient documentation

## 2013-12-06 MED ORDER — TECHNETIUM TC 99M SESTAMIBI GENERIC - CARDIOLITE
10.0000 | Freq: Once | INTRAVENOUS | Status: AC | PRN
  Administered 2013-12-06: 10 via INTRAVENOUS

## 2013-12-06 MED ORDER — TECHNETIUM TC 99M SESTAMIBI GENERIC - CARDIOLITE
30.0000 | Freq: Once | INTRAVENOUS | Status: AC | PRN
  Administered 2013-12-06: 30 via INTRAVENOUS

## 2013-12-06 MED ORDER — REGADENOSON 0.4 MG/5ML IV SOLN
0.4000 mg | Freq: Once | INTRAVENOUS | Status: AC
  Administered 2013-12-06: 0.4 mg via INTRAVENOUS

## 2013-12-06 NOTE — Procedures (Addendum)
[facility] [address] [place] [address] [phone]  Cardiology Nuclear Med Study  Samantha RubensteinBarbara A [name] is a 68 y.o. female     MRN : [id number]     DOB: Jan 29, 1946  Procedure Date: 12/06/2013  Nuclear Med Background Indication for Stress Test:  Evaluation for Ischemia History:  CHF;Last NUC MPI on 08/26/2011-nonischemic;Echo-pulmonary HTN;diastolic dysfunction Cardiac Risk Factors: Family History - CAD, History of Smoking, Hypertension, IDDM Type 2, Lipids, Obesity, PVD and L leg stent;  Symptoms:  Chest Pain, DOE, Fatigue and Light-Headedness   Nuclear Pre-Procedure Caffeine/Decaff Intake:  9:00pm NPO After: 7:00am   IV Site: R Forearm  IV 0.9% NS with Angio Cath:  22g  Chest Size (in):  n/a IV Started by: Samantha OgrenAmanda Wease, RN  Height: 5\' 11"  (1.803 m)  Cup Size: C  BMI:  Body mass index is 42.84 kg/(m^2). Weight:  307 lb (139.254 kg)   Tech Comments:  n/a    Nuclear Med Study 1 or 2 day study: 1 day  Stress Test Type:  Lexiscan  Order Authorizing Provider:  Nanetta BattyJonathan Berry, MD   Resting Radionuclide: Technetium 8559m Sestamibi  Resting Radionuclide Dose: 10.1 mCi   Stress Radionuclide:  Technetium 4159m Sestamibi  Stress Radionuclide Dose: 31.6 mCi           Stress Protocol Rest HR: 87 Stress HR: 97  Rest BP: 133/80 Stress BP: 147/85  Exercise Time (min): n/a METS: n/a   Predicted Max HR: 153 bpm % Max HR: 69.28 bpm Rate Pressure Product: [id number]  Dose of Adenosine (mg):  n/a Dose of Lexiscan: 0.4 mg  Dose of Atropine (mg): n/a Dose of Dobutamine: n/a mcg/kg/min (at max HR)  Stress Test Technologist: Samantha Sheetserry-Marie [name], CCT Nuclear Technologist: Samantha [name], CNMT   Rest Procedure:  Myocardial perfusion imaging was performed at rest 45 minutes following the intravenous administration of Technetium 5759m Sestamibi. Stress Procedure:  The patient received IV Lexiscan 0.4 mg over 15-seconds.  Technetium 7559m Sestamibi  injected IV at 30-seconds.  There were no significant changes with Lexiscan.  Quantitative spect images were obtained after a 45 minute delay.  Transient Ischemic Dilatation (Normal <1.22):  1.05 QGS EDV:  75 ml QGS ESV:  40 ml LV Ejection Fraction: 47%  Rest ECG: NSR - Normal EKG  Stress ECG: There are scattered PVCs.  QPS Raw Data Images:  Normal; no motion artifact; normal heart/lung ratio. Stress Images:  Decreased apical lateral uptake Rest Images:  Normal homogeneous uptake in all areas of the myocardium. Subtraction (SDS):  These findings are consistent with ischemia.  Impression Exercise Capacity:  Lexiscan with no exercise. BP Response:  Normal blood pressure response. Clinical Symptoms:  No significant symptoms noted. ECG Impression:  No significant ECG changes with Lexiscan. Comparison with Prior Nuclear Study: Prior study was non-ischemic in 2013  Overall Impression:  Intermediate risk stress nuclear study with reversible apical lateral, inferoapcial defect, which is small but significant.  LV Wall Motion:  LVEF 47%, septal and apical lateral hypokinesis.  Samantha NoseKenneth C. Samantha Jasmer, MD, [facility] - SuLPhur SpringsFACC Board Certified in Nuclear Cardiology Attending Cardiologist [facility]  Samantha NoseHILTY,Samantha [name] C, MD  12/06/2013 1:17 PM

## 2013-12-08 ENCOUNTER — Telehealth: Payer: Self-pay | Admitting: *Deleted

## 2013-12-08 NOTE — Telephone Encounter (Signed)
Message copied by Marella BileVOGEL, Meelah Tallo W. on Thu Dec 08, 2013 12:28 PM ------      Message from: Runell GessBERRY, JONATHAN J      Created: Tue Dec 06, 2013  6:28 PM       Return office visit with me to discuss results at next available. Intermediate risk myoview ------

## 2013-12-08 NOTE — Telephone Encounter (Signed)
Patient returned your call.

## 2013-12-08 NOTE — Telephone Encounter (Signed)
Spoke with patient and gave myoview results

## 2013-12-08 NOTE — Telephone Encounter (Signed)
Encounter complete. 

## 2013-12-13 ENCOUNTER — Encounter: Payer: Self-pay | Admitting: Cardiovascular Disease

## 2013-12-13 ENCOUNTER — Ambulatory Visit (INDEPENDENT_AMBULATORY_CARE_PROVIDER_SITE_OTHER): Payer: Medicare HMO | Admitting: Cardiovascular Disease

## 2013-12-13 ENCOUNTER — Ambulatory Visit: Payer: Medicare HMO | Admitting: Neurology

## 2013-12-13 VITALS — BP 116/78 | HR 103 | Ht 70.5 in | Wt 298.0 lb

## 2013-12-13 DIAGNOSIS — E785 Hyperlipidemia, unspecified: Secondary | ICD-10-CM

## 2013-12-13 DIAGNOSIS — R079 Chest pain, unspecified: Secondary | ICD-10-CM

## 2013-12-13 DIAGNOSIS — D689 Coagulation defect, unspecified: Secondary | ICD-10-CM

## 2013-12-13 DIAGNOSIS — I10 Essential (primary) hypertension: Secondary | ICD-10-CM

## 2013-12-13 DIAGNOSIS — I209 Angina pectoris, unspecified: Secondary | ICD-10-CM

## 2013-12-13 DIAGNOSIS — R5381 Other malaise: Secondary | ICD-10-CM

## 2013-12-13 DIAGNOSIS — R5383 Other fatigue: Secondary | ICD-10-CM

## 2013-12-13 DIAGNOSIS — Z79899 Other long term (current) drug therapy: Secondary | ICD-10-CM

## 2013-12-13 DIAGNOSIS — R9439 Abnormal result of other cardiovascular function study: Secondary | ICD-10-CM

## 2013-12-13 NOTE — Progress Notes (Signed)
12/13/2013 Samantha RubensteinBarbara A [name]   12-17-1945  [id number]  Primary Physician Cassell SmilesFUSCO,LAWRENCE J., MD Primary Cardiologist: Runell GessJonathan J. Ikeya Brockel MD Roseanne RenoFACP,FACC,FAHA, FSCAI   HPI:  Samantha [name] is a pleasant 68 year old severely overweight married Caucasian female mother of one, grandmother and one grandchild formally patient Dr. Rocco Sereneichard [name]'s who's care I am assuming.she has a history of essentially normal coronary arteries normal LV function by cath in 2004. She has had her left SFA intervened on successfully and her right unsuccessfully in the past. She has had lower extremity arterial ultrasound confirming continued patency of the left SFA stent as recently as June of this year. She really denies claudication. Cardiovascular risk factors include remote tobacco abuse having quit 11 years ago and smoked over 60 pack years. History of hypertension and hyperlipidemia as well as non-insulin-dependent diabetes. She does complain of several episodes of chest pain which are new over the last 6 months ago somewhat concerning for angina. She had a negative Myoview stress test 08/26/11 with breast attenuation artifact. In my view is remarkable for lateral ischemia towards apex.    Current Outpatient Prescriptions  Medication Sig Dispense Refill  . aspirin 81 MG tablet Take 81 mg by mouth daily. Takes 2 tablets daily.      . beta carotene w/minerals (OCUVITE) tablet Take 1 tablet by mouth daily. Takes 2 tablets twice a day.      . carvedilol (COREG) 25 MG tablet Take 25 mg by mouth 2 (two) times daily with a meal.        . Coenzyme Q10 (CO Q 10 PO) Take by mouth.        . enalapril (VASOTEC) 10 MG tablet Take 1 tablet (10 mg total) by mouth daily.  30 tablet  11  . ezetimibe (ZETIA) 10 MG tablet Take 1 tablet (10 mg total) by mouth daily.  30 tablet  6  . FOLIC ACID PO Take 1 mg by mouth. Takes 2 tablets daily in morning      . furosemide (LASIX) 40 MG tablet Take 1 tablet (40 mg total) by mouth 2 (two) times  daily.  180 tablet  1  . GABAPENTIN PO Take 100 mg by mouth 3 (three) times daily.       . GlipiZIDE (GLUCOTROL XL PO) Take 2.5 mg by mouth.       Marland Kitchen. HYDROcodone-acetaminophen (NORCO) 7.5-325 MG per tablet Take 1 tablet by mouth every 6 (six) hours as needed.      . IRON PO Take 365 mg by mouth daily.       . isosorbide mononitrate (IMDUR) 30 MG 24 hr tablet Take 1 tablet by mouth daily.      Marland Kitchen. levothyroxine (SYNTHROID, LEVOTHROID) 112 MCG tablet Take 112 mcg by mouth daily before breakfast.      . METFORMIN HCL PO Take 500 mg by mouth 3 (three) times daily.       . niacin (NIASPAN) 500 MG CR tablet Take 1 tablet (500 mg total) by mouth 2 (two) times daily.  90 tablet  3  . pravastatin (PRAVACHOL) 80 MG tablet Take 1 tablet (80 mg total) by mouth daily.  90 tablet  3  . traMADol (ULTRAM) 50 MG tablet Take 1 tablet (50 mg total) by mouth 4 (four) times daily. Takes 2 tablets 4 times daily.  90 tablet  5   Current Facility-Administered Medications  Medication Dose Route Frequency Provider Last Rate Last Dose  . methylPREDNISolone acetate (DEPO-MEDROL) injection 40 mg  40 mg Intra-articular Once Vickki Hearing, MD      . methylPREDNISolone acetate (DEPO-MEDROL) injection 40 mg  40 mg Intra-articular Once Vickki Hearing, MD        Allergies  Allergen Reactions  . Amoxicillin-Pot Clavulanate   . Amoxicillin-Pot Clavulanate   . Clarithromycin   . Lansoprazole   . Levofloxacin   . Metronidazole   . Sulfonamide Derivatives     History   Social History  . Marital Status: Married    Spouse Name: N/A    Number of Children: N/A  . Years of Education: 12   Occupational History  . Not on file.   Social History Main Topics  . Smoking status: Former Smoker    Quit date: 06/02/2002  . Smokeless tobacco: Not on file  . Alcohol Use: No  . Drug Use: No  . Sexual Activity: Not on file   Other Topics Concern  . Not on file   Social History Narrative  . No narrative on file      Review of Systems: General: negative for chills, fever, night sweats or weight changes.  Cardiovascular: negative for chest pain, dyspnea on exertion, edema, orthopnea, palpitations, paroxysmal nocturnal dyspnea or shortness of breath Dermatological: negative for rash Respiratory: negative for cough or wheezing Urologic: negative for hematuria Abdominal: negative for nausea, vomiting, diarrhea, bright red blood per rectum, melena, or hematemesis Neurologic: negative for visual changes, syncope, or dizziness All other systems reviewed and are otherwise negative except as noted above.    Blood pressure 116/78, pulse 103, height 5' 10.5" (1.791 m), weight 298 lb (135.172 kg).  General appearance: alert and no distress Neck: no adenopathy, no carotid bruit, no JVD, supple, symmetrical, trachea midline and thyroid not enlarged, symmetric, no tenderness/mass/nodules Lungs: clear to auscultation bilaterally Heart: regular rate and rhythm, S1, S2 normal, no murmur, click, rub or gallop Extremities: extremities normal, atraumatic, no cyanosis or edema  EKG not performed today  ASSESSMENT AND PLAN:   Chest pain Over the last 6 months Samantha [name] has experienced 6 episodes of chest pain lasting up to a half an hour each reading to her jaw. She recently had a Myoview stress test that showed lateral ischemia towards the apex. She saw her primary care physician who prescribed a long-acting nitrate. Based on her symptoms and her Myoview ischemic abnormalities I decided to proceed with diagnostic coronary arteriography via the right radial approach. Discussed risks and benefits of the patient and her family. Should she require stenting she is a candidate for a drug-eluting stent.  Essential hypertension Control of her medications  Hyperlipidemia On statin therapy with her most recent lipid profile performed 08/10/13 revealed a cholesterol of 173, LDL of 95 and HDL of 54      Runell Gess MD  [facility], [facility] 12/13/2013 8:06 AM

## 2013-12-13 NOTE — Assessment & Plan Note (Signed)
Over the last 6 months Ms. Samantha [name] has experienced 6 episodes of chest pain lasting up to a half an hour each reading to her jaw. She recently had a Myoview stress test that showed lateral ischemia towards the apex. She saw her primary care physician who prescribed a long-acting nitrate. Based on her symptoms and her Myoview ischemic abnormalities I decided to proceed with diagnostic coronary arteriography via the right radial approach. Discussed risks and benefits of the patient and her family. Should she require stenting she is a candidate for a drug-eluting stent.

## 2013-12-13 NOTE — Assessment & Plan Note (Signed)
On statin therapy with her most recent lipid profile performed 08/10/13 revealed a cholesterol of 173, LDL of 95 and HDL of 54

## 2013-12-13 NOTE — Assessment & Plan Note (Signed)
Control of her medications 

## 2013-12-13 NOTE — Patient Instructions (Signed)
Your physician has requested that you have a cardiac catheterization (radial). Cardiac catheterization is used to diagnose and/or treat various heart conditions. Doctors may recommend this procedure for a number of different reasons. The most common reason is to evaluate chest pain. Chest pain can be a symptom of coronary artery disease (CAD), and cardiac catheterization can show whether plaque is narrowing or blocking your heart's arteries. This procedure is also used to evaluate the valves, as well as measure the blood flow and oxygen levels in different parts of your heart. For further information please visit www.cardiosmart.org.   Following your catheterization, you will not be allowed to drive for 3 days.  No lifting, pushing, or pulling greater that 10 pounds is allowed for 1 week.  You will be required to have the following tests prior to the procedure:  1. Blood work-the blood work can be done no more than 7 days prior to the procedure.  It can be done at any [facility] lab.  There is one downstairs on the first floor of this building and one in the [facility] ([address])     

## 2013-12-15 ENCOUNTER — Encounter (HOSPITAL_COMMUNITY): Payer: Self-pay | Admitting: Pharmacy Technician

## 2013-12-16 ENCOUNTER — Telehealth: Payer: Self-pay | Admitting: Cardiovascular Disease

## 2013-12-16 MED ORDER — FOLIC ACID 1 MG PO TABS: 1.0000 mg | ORAL_TABLET | Freq: Every day | ORAL | Status: DC

## 2013-12-16 NOTE — Telephone Encounter (Signed)
Rx was sent to pharmacy electronically. Patient notified.  

## 2013-12-16 NOTE — Telephone Encounter (Signed)
Pt need a new prescription for her Folic Acid. Please call to [phone].

## 2013-12-19 LAB — BASIC METABOLIC PANEL
BUN: 14 mg/dL (ref 6–23)
CALCIUM: 9.2 mg/dL (ref 8.4–10.5)
CO2: 30 mEq/L (ref 19–32)
CREATININE: 0.82 mg/dL (ref 0.50–1.10)
Chloride: 98 mEq/L (ref 96–112)
Glucose, Bld: 123 mg/dL — ABNORMAL HIGH (ref 70–99)
Potassium: 3.8 mEq/L (ref 3.5–5.3)
Sodium: 140 mEq/L (ref 135–145)

## 2013-12-19 LAB — PROTIME-INR
INR: 1.04 (ref <1.50)
Prothrombin Time: 13.6 seconds (ref 11.6–15.2)

## 2013-12-19 LAB — APTT: APTT: 33 s (ref 24–37)

## 2013-12-19 LAB — CBC
HCT: 37.8 % (ref 36.0–46.0)
HEMOGLOBIN: 12.7 g/dL (ref 12.0–15.0)
MCH: 28.8 pg (ref 26.0–34.0)
MCHC: 33.6 g/dL (ref 30.0–36.0)
MCV: 85.7 fL (ref 78.0–100.0)
Platelets: 296 10*3/uL (ref 150–400)
RBC: 4.41 MIL/uL (ref 3.87–5.11)
RDW: 16.5 % — ABNORMAL HIGH (ref 11.5–15.5)
WBC: 11.5 10*3/uL — AB (ref 4.0–10.5)

## 2013-12-19 LAB — TSH: TSH: 1.281 u[IU]/mL (ref 0.350–4.500)

## 2013-12-20 ENCOUNTER — Other Ambulatory Visit: Payer: Self-pay | Admitting: *Deleted

## 2013-12-20 MED ORDER — FOLIC ACID 1 MG PO TABS: 1.0000 mg | ORAL_TABLET | Freq: Every day | ORAL | Status: AC

## 2013-12-20 NOTE — Telephone Encounter (Signed)
Rx refill sent to patient pharmacy   

## 2013-12-22 ENCOUNTER — Telehealth: Payer: Self-pay | Admitting: Cardiovascular Disease

## 2013-12-22 ENCOUNTER — Ambulatory Visit (HOSPITAL_COMMUNITY)
Admission: RE | Admit: 2013-12-22 | Discharge: 2013-12-22 | Disposition: A | Discharge status: Home / Self Care | Payer: Medicare HMO | Source: Ambulatory Visit | Attending: Cardiovascular Disease | Admitting: Cardiovascular Disease

## 2013-12-22 ENCOUNTER — Encounter (HOSPITAL_COMMUNITY)
Admission: RE | Disposition: A | Discharge status: Home / Self Care | Payer: Self-pay | Source: Ambulatory Visit | Attending: Cardiovascular Disease

## 2013-12-22 DIAGNOSIS — R0789 Other chest pain: Secondary | ICD-10-CM | POA: Insufficient documentation

## 2013-12-22 DIAGNOSIS — I10 Essential (primary) hypertension: Secondary | ICD-10-CM | POA: Diagnosis not present

## 2013-12-22 DIAGNOSIS — R9439 Abnormal result of other cardiovascular function study: Secondary | ICD-10-CM

## 2013-12-22 DIAGNOSIS — Z6841 Body Mass Index (BMI) 40.0 and over, adult: Secondary | ICD-10-CM | POA: Insufficient documentation

## 2013-12-22 DIAGNOSIS — E119 Type 2 diabetes mellitus without complications: Secondary | ICD-10-CM | POA: Diagnosis not present

## 2013-12-22 DIAGNOSIS — R5383 Other fatigue: Secondary | ICD-10-CM

## 2013-12-22 DIAGNOSIS — R5381 Other malaise: Secondary | ICD-10-CM

## 2013-12-22 DIAGNOSIS — R079 Chest pain, unspecified: Secondary | ICD-10-CM

## 2013-12-22 DIAGNOSIS — Z79899 Other long term (current) drug therapy: Secondary | ICD-10-CM

## 2013-12-22 DIAGNOSIS — E785 Hyperlipidemia, unspecified: Secondary | ICD-10-CM | POA: Diagnosis not present

## 2013-12-22 DIAGNOSIS — D689 Coagulation defect, unspecified: Secondary | ICD-10-CM

## 2013-12-22 DIAGNOSIS — E663 Overweight: Secondary | ICD-10-CM | POA: Insufficient documentation

## 2013-12-22 DIAGNOSIS — I209 Angina pectoris, unspecified: Secondary | ICD-10-CM

## 2013-12-22 HISTORY — PX: CARDIAC CATHETERIZATION: SHX172

## 2013-12-22 HISTORY — PX: LEFT HEART CATHETERIZATION WITH CORONARY ANGIOGRAM: SHX5451

## 2013-12-22 LAB — GLUCOSE, CAPILLARY
Glucose-Capillary: 164 mg/dL — ABNORMAL HIGH (ref 70–99)
Glucose-Capillary: 144 mg/dL — ABNORMAL HIGH (ref 70–99)

## 2013-12-22 SURGERY — LEFT HEART CATHETERIZATION WITH CORONARY ANGIOGRAM
Anesthesia: LOCAL

## 2013-12-22 MED ORDER — ACETAMINOPHEN 325 MG PO TABS: 650.0000 mg | ORAL_TABLET | ORAL | Status: DC | PRN

## 2013-12-22 MED ORDER — ONDANSETRON HCL 4 MG/2ML IJ SOLN: 4.0000 mg | Freq: Four times a day (QID) | INTRAMUSCULAR | Status: DC | PRN

## 2013-12-22 MED ORDER — FENTANYL CITRATE 0.05 MG/ML IJ SOLN
INTRAMUSCULAR | Status: AC
  Filled 2013-12-22: qty 2

## 2013-12-22 MED ORDER — ASPIRIN 81 MG PO CHEW
CHEWABLE_TABLET | ORAL | Status: AC
  Administered 2013-12-22: 81 mg
  Filled 2013-12-22: qty 1

## 2013-12-22 MED ORDER — SODIUM CHLORIDE 0.9 % IV SOLN
INTRAVENOUS | Status: DC
  Administered 2013-12-22: 75 mL/h via INTRAVENOUS

## 2013-12-22 MED ORDER — MIDAZOLAM HCL 2 MG/2ML IJ SOLN
INTRAMUSCULAR | Status: AC
  Filled 2013-12-22: qty 2

## 2013-12-22 MED ORDER — LIDOCAINE HCL (PF) 1 % IJ SOLN
INTRAMUSCULAR | Status: AC
  Filled 2013-12-22: qty 30

## 2013-12-22 MED ORDER — HEPARIN (PORCINE) IN NACL 2-0.9 UNIT/ML-% IJ SOLN
INTRAMUSCULAR | Status: AC
  Filled 2013-12-22: qty 1000

## 2013-12-22 MED ORDER — ASPIRIN 81 MG PO CHEW: 81.0000 mg | CHEWABLE_TABLET | ORAL | Status: DC

## 2013-12-22 MED ORDER — VERAPAMIL HCL 2.5 MG/ML IV SOLN
INTRAVENOUS | Status: AC
  Filled 2013-12-22: qty 2

## 2013-12-22 MED ORDER — SODIUM CHLORIDE 0.9 % IV SOLN: INTRAVENOUS | Status: AC

## 2013-12-22 MED ORDER — SODIUM CHLORIDE 0.9 % IJ SOLN: 3.0000 mL | INTRAMUSCULAR | Status: DC | PRN

## 2013-12-22 MED ORDER — HEPARIN SODIUM (PORCINE) 1000 UNIT/ML IJ SOLN
INTRAMUSCULAR | Status: AC
  Filled 2013-12-22: qty 1

## 2013-12-22 MED ORDER — MORPHINE SULFATE 2 MG/ML IJ SOLN
1.0000 mg | INTRAMUSCULAR | Status: DC | PRN
Start: 2013-12-22 — End: 2013-12-22

## 2013-12-22 MED ORDER — NITROGLYCERIN 1 MG/10 ML FOR IR/CATH LAB
INTRA_ARTERIAL | Status: AC
  Filled 2013-12-22: qty 10

## 2013-12-22 NOTE — CV Procedure (Signed)
Samantha [name] is a 68 y.o. female    [id number] LOCATION:  FACILITY: [facility]  PHYSICIAN: Nanetta BattyJonathan Arvine Clayburn, M.D. 17-Nov-1945   DATE OF PROCEDURE:  12/22/2013  DATE OF DISCHARGE:     CARDIAC CATHETERIZATION     History obtained from chart review.Ms. Samantha [name] is a pleasant 68 year old severely overweight married Caucasian female mother of one, grandmother and one grandchild formally patient Dr. Rocco Sereneichard Weintraub's who's care I am assuming.she has a history of essentially normal coronary arteries normal LV function by cath in 2004. She has had her left SFA intervened on successfully and her right unsuccessfully in the past. She has had lower extremity arterial ultrasound confirming continued patency of the left SFA stent as recently as June of this year. She really denies claudication. Cardiovascular risk factors include remote tobacco abuse having quit 11 years ago and smoked over 60 pack years. History of hypertension and hyperlipidemia as well as non-insulin-dependent diabetes. She does complain of several episodes of chest pain which are new over the last 6 months ago somewhat concerning for angina. She had a negative Myoview stress test 08/26/11 with breast attenuation artifact. Her most recent Myoview is remarkable for lateral ischemia towards apex. Because of this, I elected to proceed with outpatient diagnostic coronary arteriography via the right radial approach.    PROCEDURE DESCRIPTION:   The patient was brought to the second floor [facility] cath lab in the postabsorptive state. She was premedicated with Valium 5 mg by mouth, IV Versed and fentanyl. Her right wrist was prepped and shaved in usual sterile fashion. Xylocaine 1% was used for local anesthesia. A 5 French sheath was inserted into the right radial artery using standard Seldinger technique. The patient received 6000 units  of heparin  intravenously.  Using a 5 JamaicaFrench TIG catheter along with pigtail catheter left coronary  angiography and left ventriculography were performed. Omnipaque dye was used for the entirety of the case. The retrograde aortic, left ventricular and pullback pressures were recorded.    HEMODYNAMICS:    AO SYSTOLIC/AO DIASTOLIC: 114/72   LV SYSTOLIC/LV DIASTOLIC: 113/17  ANGIOGRAPHIC RESULTS:   1. Left main; normal  2. LAD; normal 3. Left circumflex; normal.  4. Right coronary artery; dominant and normal 5. Left ventriculography; RAO left ventriculogram was performed using  25 mL of Visipaque dye at 12 mL/second. The overall LVEF estimated  65 %  Without wall motion abnormalities  IMPRESSION:Ms. Samantha [name] has normal coronaries and normal left ventricular function. I believe her chest pain is noncardiac and her Myoview abnormality is a false positive secondary to breast attenuation. The radial sheath was removed and a T-R band was placed on the right wrist to achieve patent hemostasis. The patient left the lab in stable condition. She'll be hydrated for 2 hours, discharged home and will see mid-level provider back in one to 2 weeks, me back in 4-6 weeks.  Runell GessBERRY,Phineas Mcenroe J. MD, [facility] CenterFACC 12/22/2013 10:13 AM

## 2013-12-22 NOTE — Discharge Instructions (Signed)
Radial Site Care °Refer to this sheet in the next few weeks. These instructions provide you with information on caring for yourself after your procedure. Your caregiver may also give you more specific instructions. Your treatment has been planned according to current medical practices, but problems sometimes occur. Call your caregiver if you have any problems or questions after your procedure. °HOME CARE INSTRUCTIONS °· You may shower the day after the procedure. Remove the bandage (dressing) and gently wash the site with plain soap and water. Gently pat the site dry. °· Do not apply powder or lotion to the site. °· Do not submerge the affected site in water for 3 to 5 days. °· Inspect the site at least twice daily. °· Do not flex or bend the affected arm for 24 hours. °· No lifting over 5 pounds (2.3 kg) for 5 days after your procedure. °· Do not drive home if you are discharged the same day of the procedure. Have someone else drive you. °· You may drive 24 hours after the procedure unless otherwise instructed by your caregiver. °· Do not operate machinery or power tools for 24 hours. °· A responsible adult should be with you for the first 24 hours after you arrive home. °What to expect: °· Any bruising will usually fade within 1 to 2 weeks. °· Blood that collects in the tissue (hematoma) may be painful to the touch. It should usually decrease in size and tenderness within 1 to 2 weeks. °SEEK IMMEDIATE MEDICAL CARE IF: °· You have unusual pain at the radial site. °· You have redness, warmth, swelling, or pain at the radial site. °· You have drainage (other than a small amount of blood on the dressing). °· You have chills. °· You have a fever or persistent symptoms for more than 72 hours. °· You have a fever and your symptoms suddenly get worse. °· Your arm becomes pale, cool, tingly, or numb. °· You have heavy bleeding from the site. Hold pressure on the site. °Document Released: 06/21/2010 Document Revised:  08/11/2011 Document Reviewed: 06/21/2010 °ExitCare® Patient Information ©2015 [employer]. This information is not intended to replace advice given to you by your health care provider. Make sure you discuss any questions you have with your health care provider. ° °

## 2013-12-22 NOTE — Telephone Encounter (Signed)
Closed encounter °

## 2014-01-04 ENCOUNTER — Ambulatory Visit (INDEPENDENT_AMBULATORY_CARE_PROVIDER_SITE_OTHER): Payer: Commercial Managed Care - HMO | Admitting: Cardiology

## 2014-01-04 ENCOUNTER — Encounter: Payer: Self-pay | Admitting: Cardiology

## 2014-01-04 VITALS — BP 104/62 | Ht 71.0 in | Wt 296.1 lb

## 2014-01-04 DIAGNOSIS — I10 Essential (primary) hypertension: Secondary | ICD-10-CM

## 2014-01-04 DIAGNOSIS — I70219 Atherosclerosis of native arteries of extremities with intermittent claudication, unspecified extremity: Secondary | ICD-10-CM

## 2014-01-04 DIAGNOSIS — R079 Chest pain, unspecified: Secondary | ICD-10-CM

## 2014-01-04 DIAGNOSIS — E785 Hyperlipidemia, unspecified: Secondary | ICD-10-CM

## 2014-01-04 DIAGNOSIS — R1013 Epigastric pain: Secondary | ICD-10-CM

## 2014-01-04 DIAGNOSIS — R109 Unspecified abdominal pain: Secondary | ICD-10-CM | POA: Insufficient documentation

## 2014-01-04 NOTE — Patient Instructions (Signed)
Your physician recommends that you schedule a follow-up appointment Keep Appointment on August 25th  Your physician has recommended you make the following change in your medication: Start over the counter Mucinex twice daily

## 2014-01-04 NOTE — Assessment & Plan Note (Signed)
To follow up with PCP

## 2014-01-04 NOTE — Progress Notes (Signed)
01/04/2014   PCP: Cassell Smiles., MD   Chief Complaint  Patient presents with  . Follow-up    Post Hospital Cath no stents was placed    Primary Cardiologist:Dr. Erlene Quan   HPI:  68 year old severely overweight married Caucasian female mother of one, grandmother and one grandchild formally patient Dr. Rocco Serene who is now followed by Dr. Erlene Quan is back today for followup after cardiac catheterization.  She has a history of essentially normal coronary arteries normal LV function by cath in 2004. She has had her left SFA intervened on successfully and her right unsuccessfully in the past. She has had lower extremity arterial ultrasound confirming continued patency of the left SFA stent as recently as June of this year. She really denies claudication.   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   Cardiovascular risk factors include remote tobacco abuse having quit 11 years ago and smoked over 60 pack years. History of hypertension and hyperlipidemia as well as non-insulin-dependent diabetes. She had complained of several episodes of chest pain which were new over the last 6 months somewhat concerning for angina. She had a positive Myoview stress test 12/07/13 and then cardiac cath.  Normal coronary arteries on cath.    She tells me that the Imdur helped her pain, she still has pain abdominal cramping and nausea. No more chest pain.  She also complains of mucus in the back of her throat.     Allergies  Allergen Reactions  . Clarithromycin Anaphylaxis  . Levofloxacin Anaphylaxis  . Metronidazole Anaphylaxis  . Sulfonamide Derivatives Anaphylaxis  . Amoxicillin-Pot Clavulanate     Heart palpitations  . Amoxicillin-Pot Clavulanate   . Lansoprazole     Unknown    Current Outpatient Prescriptions  Medication Sig Dispense Refill  . aspirin 81 MG tablet Take 162 mg by mouth daily.       . beta  carotene w/minerals (OCUVITE) tablet Take 1 tablet by mouth 2 (two) times daily.       . carvedilol (COREG) 25 MG tablet Take 25 mg by mouth 2 (two) times daily with a meal.        . Coenzyme Q10 (CO Q 10) 100 MG CAPS Take 100 mg by mouth 2 (two) times daily.       . enalapril (VASOTEC) 10 MG tablet Take 1 tablet (10 mg total) by mouth daily.  30 tablet  11  . folic acid (FOLVITE) 1 MG tablet Take 1 tablet (1 mg total) by mouth daily.  90 tablet  3  . furosemide (LASIX) 40 MG tablet Take 80 mg by mouth daily.      Marland Kitchen gabapentin (NEURONTIN) 100 MG capsule Take 100 mg by mouth 3 (three) times daily.      Marland Kitchen glipiZIDE (GLUCOTROL) 10 MG tablet Take 2.5 mg by mouth daily as needed (for high blood sugar).      Marland Kitchen HYDROcodone-acetaminophen (NORCO) 7.5-325 MG per tablet Take 1 tablet by mouth every 6 (six) hours as needed for moderate pain.       . IRON PO Take 365 mg by mouth daily.       . isosorbide mononitrate (IMDUR) 30 MG 24 hr tablet Take 1 tablet by mouth daily.      Marland Kitchen levothyroxine (SYNTHROID, LEVOTHROID) 112 MCG tablet Take 112 mcg by mouth daily before breakfast.      . metFORMIN (  GLUCOPHAGE) 500 MG tablet Take 500 mg by mouth 4 (four) times daily.      . niacin (NIASPAN) 500 MG CR tablet Take 1 tablet (500 mg total) by mouth 2 (two) times daily.  90 tablet  3  . pravastatin (PRAVACHOL) 80 MG tablet Take 1 tablet (80 mg total) by mouth daily.  90 tablet  3  . traMADol (ULTRAM) 50 MG tablet Take 100 mg by mouth 3 (three) times daily. Takes 2 tablets 4 times daily.      Marland Kitchen ZETIA 10 MG tablet Take 1 tablet by mouth daily.       Current Facility-Administered Medications  Medication Dose Route Frequency Provider Last Rate Last Dose  . methylPREDNISolone acetate (DEPO-MEDROL) injection 40 mg  40 mg Intra-articular Once Vickki Hearing, MD      . methylPREDNISolone acetate (DEPO-MEDROL) injection 40 mg  40 mg Intra-articular Once Vickki Hearing, MD        Past Medical History  Diagnosis Date    . Brain aneurysm   . CHF (congestive heart failure)   . Neuropathy   . Diabetes mellitus   . Anemia   . Thyroid disease   . Arthritis   . Macular degeneration   . Chest pain   . Hypertension   . Hyperlipidemia   . Obesity   . Abnormal stress test     -false positive, prob breast attenuation  . Hx of cardiac catheterization 11/2013    normal coronary arteries    Past Surgical History  Procedure Laterality Date  . Tonsillectomy    . Appendectomy    . Lower back    . Heel spurs    . Hammer toes    . Right knee sark    . Cerebral aneurysm repair    . Cardiac catheterization  12/22/13    done for "angina" and abnormal nuc study- normal coronary arteries  . Cardiac catheterization  2004    insignificant CAD    ZOX:WRUEAVW:+ cold several months ago no fevers, no weight changes Skin:no rashes or ulcers HEENT:no blurred vision, no congestion CV:see HPI PUL:see HPI GI:no diarrhea constipation or melena, no indigestion, + abd pain and nausea GU:no hematuria, no dysuria UJ:WJXBJYN joint pain, no claudication Neuro:no syncope, no lightheadedness Endo:+ diabetes, + thyroid disease  Wt Readings from Last 3 Encounters:  01/04/14 296 lb 1.6 oz (134.31 kg)  12/22/13 297 lb (134.718 kg)  12/22/13 297 lb (134.718 kg)    PHYSICAL EXAM BP 104/62  Ht 5\' 11"  (1.803 m)  Wt 296 lb 1.6 oz (134.31 kg)  BMI 41.32 kg/m2 General:Pleasant affect, NAD Skin:Warm and dry, brisk capillary refill HEENT:normocephalic, sclera clear, mucus membranes moist Neck:supple, no JVD, no bruits  Heart:S1S2 RRR without murmur, gallup, rub or click Lungs:clear without rales, rhonchi, or wheezes WGN:FAOZ, non tender, + BS, do not palpate liver spleen or masses Ext:tr to 1+ lower ext edema, 2+ pedal pulses, 2+ radial pulses, rt radial cath site stable, mild ecchymosis Neuro:alert and oriented, MAE, follows commands, + facial symmetry HYQ:MVHQ today   *ASSESSMENT AND PLAN Chest pain abnormal nuc,  underwent cardiac catheterization finding normal coronaries and normal left ventricular function.  So it was a false positive nuc study.  She believes the Imdur has helped her pain, therefore I did not stop the Imdur.  She admits to abdominal cramping and nausea. She is to see Dr. Sherwood Gambler and plans to ask for GI consult.    Essential hypertension controlled  Atherosclerosis of native  arteries of the extremities with intermittent claudication Currently stable  Hyperlipidemia Lipid Panel     Component Value Date/Time   CHOL 173 08/10/2013 0921   TRIG 120 08/10/2013 0921   HDL 54 08/10/2013 0921   CHOLHDL 3.2 08/10/2013 0921   VLDL 24 08/10/2013 0921   LDLCALC 95 08/10/2013 0921  stable   Abdominal pain To follow up with PCP

## 2014-01-04 NOTE — Assessment & Plan Note (Signed)
controlled 

## 2014-01-04 NOTE — Assessment & Plan Note (Signed)
Currently stable.

## 2014-01-04 NOTE — Assessment & Plan Note (Signed)
Lipid Panel     Component Value Date/Time   CHOL 173 08/10/2013 0921   TRIG 120 08/10/2013 0921   HDL 54 08/10/2013 0921   CHOLHDL 3.2 08/10/2013 0921   VLDL 24 08/10/2013 0921   LDLCALC 95 08/10/2013 0921  stable

## 2014-01-04 NOTE — Assessment & Plan Note (Signed)
abnormal nuc, underwent cardiac catheterization finding normal coronaries and normal left ventricular function.  So it was a false positive nuc study.  She believes the Imdur has helped her pain, therefore I did not stop the Imdur.  She admits to abdominal cramping and nausea. She is to see Dr. Sherwood GamblerFusco and plans to ask for GI consult.

## 2014-01-10 ENCOUNTER — Other Ambulatory Visit (HOSPITAL_COMMUNITY): Payer: Self-pay | Admitting: Internal Medicine

## 2014-01-10 DIAGNOSIS — R109 Unspecified abdominal pain: Secondary | ICD-10-CM

## 2014-01-13 ENCOUNTER — Ambulatory Visit (HOSPITAL_COMMUNITY)
Admission: RE | Admit: 2014-01-13 | Discharge: 2014-01-13 | Disposition: A | Discharge status: Home / Self Care | Payer: Medicare HMO | Source: Ambulatory Visit | Attending: Internal Medicine | Admitting: Internal Medicine

## 2014-01-13 DIAGNOSIS — R109 Unspecified abdominal pain: Secondary | ICD-10-CM | POA: Diagnosis present

## 2014-01-13 DIAGNOSIS — R160 Hepatomegaly, not elsewhere classified: Secondary | ICD-10-CM | POA: Diagnosis not present

## 2014-01-13 DIAGNOSIS — K7689 Other specified diseases of liver: Secondary | ICD-10-CM | POA: Diagnosis not present

## 2014-01-20 ENCOUNTER — Other Ambulatory Visit (HOSPITAL_COMMUNITY): Payer: Self-pay | Admitting: Internal Medicine

## 2014-01-20 DIAGNOSIS — R109 Unspecified abdominal pain: Secondary | ICD-10-CM

## 2014-01-24 ENCOUNTER — Encounter (HOSPITAL_COMMUNITY)
Admission: RE | Admit: 2014-01-24 | Discharge: 2014-01-24 | Disposition: A | Discharge status: Home / Self Care | Payer: Medicare HMO | Source: Ambulatory Visit | Attending: Internal Medicine | Admitting: Internal Medicine

## 2014-01-24 ENCOUNTER — Ambulatory Visit: Payer: Commercial Managed Care - HMO | Admitting: Cardiovascular Disease

## 2014-01-24 ENCOUNTER — Encounter (HOSPITAL_COMMUNITY): Payer: Self-pay

## 2014-01-24 DIAGNOSIS — R109 Unspecified abdominal pain: Secondary | ICD-10-CM | POA: Insufficient documentation

## 2014-01-24 MED ORDER — SODIUM CHLORIDE 0.9 % IJ SOLN
INTRAMUSCULAR | Status: AC
  Filled 2014-01-24: qty 12

## 2014-01-24 MED ORDER — SINCALIDE 5 MCG IJ SOLR
INTRAMUSCULAR | Status: AC
  Administered 2014-01-24: 2.69 ug via INTRAVENOUS
  Filled 2014-01-24: qty 5

## 2014-01-24 MED ORDER — TECHNETIUM TC 99M MEBROFENIN IV KIT
5.0000 | PACK | Freq: Once | INTRAVENOUS | Status: AC | PRN
  Administered 2014-01-24: 5 via INTRAVENOUS

## 2014-01-24 MED ORDER — STERILE WATER FOR INJECTION IJ SOLN
INTRAMUSCULAR | Status: AC
  Administered 2014-01-24: 2.69 mL via INTRAVENOUS
  Filled 2014-01-24: qty 10

## 2014-02-01 ENCOUNTER — Encounter (HOSPITAL_COMMUNITY): Payer: Self-pay | Admitting: Pharmacy Technician

## 2014-02-01 NOTE — H&P (Signed)
  NTS SOAP Note  Vital Signs:  Vitals as of: 01/31/2014: Systolic 130: Diastolic 75: Heart Rate 90: Temp 98.93F: Height 53ft 11in: Weight 293Lbs 0 Ounces: Pain Level 7: BMI 40.86  BMI : 40.86 kg/m2  Subjective: This 68 year old female presents for of upper abdominal pain and nausea. Patient has been having right upper quadrant abdominal pain,  nausea,  and heartburn for many months. It seems to be worsening. She has had an extensive workupand a high the skin ultimately showed abnormal gallbladder response to CCK stimulation with no gallbladder emptying after CCK injection. The CCK injection did reproduce her symptoms. She denies any fever,  chills,  or jaundice.  Review of Symptoms:  fatigue Head:unremarkable Eyes:blurred vision bilateral Nose/Mouth/Throat:nasal congestion  Cardiovascular:  unremarkable Respiratory:unremarkable Gastrointestinabdominal pain, nausea, vomiting, heartburn Genitourinary:frequency joint pain Skin:unremarkable Hematolgic/Lymphatic:unremarkable   Allergic/Immunologic:unremarkable   Past Medical History:  Reviewed  Past Medical History  Surgical History: appendectomy,  disc removal in the back,  brain aneurysm  Medical Problems: history of CHF,  high cholesterol level,  non-insulin-dependent diabetes mellitus,  hypothyroidism,  hypertension,  asthma Allergies: Augmentin,  metronidazole,  clarithromycin,  Prevacid,  Levaquin,  Bactrim Medications: glipizide,  Synthroid,  carvedilol,  gabapentin,  enalapril,  folic acid,  iron supplements,  tramadol,  Lasix,  Niaspan,  Sergio,  pravastatin,  metformin,  baby aspirin   Social History:Reviewed  Social History  Preferred Language: English Race:  White Ethnicity: Not Hispanic / Latino Age: 39 year Marital Status:  M Alcohol: no   Smoking Status: Never smoker reviewed on 02/01/2014 Functional Status reviewed on 02/01/2014 ------------------------------------------------ Bathing:  Normal Cooking: Normal Dressing: Normal Driving: Normal Eating: Normal Managing Meds: Normal Oral Care: Normal Shopping: Normal Toileting: Normal Transferring: Disablilty Walking: Disablilty Cognitive Status reviewed on 02/01/2014 ------------------------------------------------ Attention: Normal Decision Making: Normal Language: Normal Memory: Normal Motor: Normal Perception: Normal Problem Solving: Normal Visual and Spatial: Normal   Family History:Reviewed  Family Health History Mother, Unknown; Healthy; blood disorder Father, Unknown; Healthy; brain and chest neoplasm    Objective Information: obese white female who is sitting in wheelchair. She does ambulate with a walker. no scleral icterus. Heart:RRR, no murmur or gallop.  Normal S1, S2.  No S3, S4.  Lungs:  CTA bilaterally, no wheezes, rhonchi, rales.  Breathing unlabored. Breasts:  TannerGirlsBreast Abdomen:Soft, slightly tender to palpation in the right upper quadrant, ND, normal bowel sounds, no HSM, no masses.  No peritoneal signs.  Assessment:chronic cholecystitis  Diagnoses: 575.11  K81.1 Chronic cholecystitis (Chronic cholecystitis)  Procedures: 40981 - OFFICE OUTPATIENT NEW 30 MINUTES    Plan:scheduled for laparoscopic cholecystectomy on 02/08/2014.   Patient Education:Alternative treatments to surgery were discussed with patient (and family).  Risks and benefits  of procedure including bleeding,  infection,  hepatobiliary,  cardiopulmonary difficulties,  and the possibility of an open procedure were fully explained to the patient (and family) who gave informed consent. Patient/family questions were addressed.  Follow-up:Pending Surgery

## 2014-02-03 ENCOUNTER — Encounter (HOSPITAL_COMMUNITY)
Admission: RE | Admit: 2014-02-03 | Discharge: 2014-02-03 | Disposition: A | Discharge status: Home / Self Care | Payer: Medicare HMO | Source: Ambulatory Visit | Attending: General Surgery | Admitting: General Surgery

## 2014-02-03 ENCOUNTER — Encounter (HOSPITAL_COMMUNITY): Payer: Self-pay

## 2014-02-03 DIAGNOSIS — Z01818 Encounter for other preprocedural examination: Secondary | ICD-10-CM | POA: Diagnosis present

## 2014-02-03 DIAGNOSIS — K811 Chronic cholecystitis: Secondary | ICD-10-CM | POA: Insufficient documentation

## 2014-02-03 HISTORY — DX: Sleep apnea, unspecified: G47.30

## 2014-02-03 LAB — HEPATIC FUNCTION PANEL
ALBUMIN: 3.3 g/dL — AB (ref 3.5–5.2)
ALT: 14 U/L (ref 0–35)
AST: 18 U/L (ref 0–37)
Alkaline Phosphatase: 67 U/L (ref 39–117)
Bilirubin, Direct: <0.2 mg/dL (ref 0.0–0.3)
TOTAL PROTEIN: 6.7 g/dL (ref 6.0–8.3)
Total Bilirubin: 0.3 mg/dL (ref 0.3–1.2)

## 2014-02-03 LAB — CBC WITH DIFFERENTIAL/PLATELET
BASOS PCT: 0 % (ref 0–1)
Basophils Absolute: 0 10*3/uL (ref 0.0–0.1)
EOS ABS: 0.3 10*3/uL (ref 0.0–0.7)
Eosinophils Relative: 3 % (ref 0–5)
HCT: 37 % (ref 36.0–46.0)
Hemoglobin: 12.1 g/dL (ref 12.0–15.0)
Lymphocytes Relative: 17 % (ref 12–46)
Lymphs Abs: 2 10*3/uL (ref 0.7–4.0)
MCH: 29.6 pg (ref 26.0–34.0)
MCHC: 32.7 g/dL (ref 30.0–36.0)
MCV: 90.5 fL (ref 78.0–100.0)
Monocytes Absolute: 0.8 10*3/uL (ref 0.1–1.0)
Monocytes Relative: 7 % (ref 3–12)
NEUTROS PCT: 73 % (ref 43–77)
Neutro Abs: 8.3 10*3/uL — ABNORMAL HIGH (ref 1.7–7.7)
PLATELETS: 313 10*3/uL (ref 150–400)
RBC: 4.09 MIL/uL (ref 3.87–5.11)
RDW: 14.3 % (ref 11.5–15.5)
WBC: 11.4 10*3/uL — ABNORMAL HIGH (ref 4.0–10.5)

## 2014-02-03 LAB — BASIC METABOLIC PANEL
Anion gap: 15 (ref 5–15)
BUN: 12 mg/dL (ref 6–23)
CO2: 32 mEq/L (ref 19–32)
CREATININE: 1.08 mg/dL (ref 0.50–1.10)
Calcium: 9.4 mg/dL (ref 8.4–10.5)
Chloride: 98 mEq/L (ref 96–112)
GFR calc non Af Amer: 52 mL/min — ABNORMAL LOW (ref >90)
GFR, EST AFRICAN AMERICAN: 60 mL/min — AB (ref >90)
Glucose, Bld: 173 mg/dL — ABNORMAL HIGH (ref 70–99)
Potassium: 3.9 mEq/L (ref 3.7–5.3)
Sodium: 145 mEq/L (ref 137–147)

## 2014-02-03 NOTE — Patient Instructions (Addendum)
Samantha [name]  02/03/2014   Your procedure is scheduled on:  Wednesday, 02/08/14  Report to [facility] at 0700 AM.  Call this number if you have problems the morning of surgery: [phone]   Remember:   Do not eat food or drink liquids after midnight.   Take these medicines the morning of surgery with A SIP OF WATER: levothyroxine, Imdur, neurontin, enalapril, carvedilol, and norco or ultram if needed. Please DO NOT take your medications for diabetes.   Do not wear jewelry, make-up or nail polish.  Do not wear lotions, powders, or perfumes. You may wear deodorant.  Do not shave 48 hours prior to surgery. Men may shave face and neck.  Do not bring valuables to the hospital.  [facility] is not responsible                  for any belongings or valuables.               Contacts, dentures or bridgework may not be worn into surgery.  Leave suitcase in the car. After surgery it may be brought to your room.  For patients admitted to the hospital, discharge time is determined by your                treatment team.               Patients discharged the day of surgery will not be allowed to drive  home.  Name and phone number of your driver: family  Special Instructions: Shower using CHG 2 nights before surgery and the night before surgery.  If you shower the day of surgery use CHG.  Use special wash - you have one bottle of CHG for all showers.  You should use approximately 1/3 of the bottle for each shower.   Please read over the following fact sheets that you were given: Pain Booklet, Anesthesia Post-op Instructions and Care and Recovery After Surgery  General Anesthesia, Care After Refer to this sheet in the next few weeks. These instructions provide you with information on caring for yourself after your procedure. Your health care provider may also give you more specific instructions. Your treatment has been planned according to current medical practices, but problems sometimes occur. Call  your health care provider if you have any problems or questions after your procedure. WHAT TO EXPECT AFTER THE PROCEDURE After the procedure, it is typical to experience:  Sleepiness.  Nausea and vomiting. HOME CARE INSTRUCTIONS  For the first 24 hours after general anesthesia:  Have a responsible person with you.  Do not drive a car. If you are alone, do not take public transportation.  Do not drink alcohol.  Do not take medicine that has not been prescribed by your health care provider.  Do not sign important papers or make important decisions.  You may resume a normal diet and activities as directed by your health care provider.  Change bandages (dressings) as directed.  If you have questions or problems that seem related to general anesthesia, call the hospital and ask for the anesthetist or anesthesiologist on call. SEEK MEDICAL CARE IF:  You have nausea and vomiting that continue the day after anesthesia.  You develop a rash. SEEK IMMEDIATE MEDICAL CARE IF:   You have difficulty breathing.  You have chest pain.  You have any allergic problems. Document Released: 08/25/2000 Document Revised: 05/24/2013 Document Reviewed: 12/02/2012 [facility] Patient Information 2015 [place], [place]. This information is not intended to  replace advice given to you by your health care provider. Make sure you discuss any questions you have with your health care provider. Laparoscopic Cholecystectomy Laparoscopic cholecystectomy is surgery to remove the gallbladder. The gallbladder is located in the upper right part of the abdomen, behind the liver. It is a storage sac for bile produced in the liver. Bile aids in the digestion and absorption of fats. Cholecystectomy is often done for inflammation of the gallbladder (cholecystitis). This condition is usually caused by a buildup of gallstones (cholelithiasis) in your gallbladder. Gallstones can block the flow of bile, resulting in inflammation  and pain. In severe cases, emergency surgery may be required. When emergency surgery is not required, you will have time to prepare for the procedure. Laparoscopic surgery is an alternative to open surgery. Laparoscopic surgery has a shorter recovery time. Your common bile duct may also need to be examined during the procedure. If stones are found in the common bile duct, they may be removed. LET [facility] CARE PROVIDER KNOW ABOUT:  Any allergies you have.  All medicines you are taking, including vitamins, herbs, eye drops, creams, and over-the-counter medicines.  Previous problems you or members of your family have had with the use of anesthetics.  Any blood disorders you have.  Previous surgeries you have had.  Medical conditions you have. RISKS AND COMPLICATIONS Generally, this is a safe procedure. However, as with any procedure, complications can occur. Possible complications include:  Infection.  Damage to the common bile duct, nerves, arteries, veins, or other internal organs such as the stomach, liver, or intestines.  Bleeding.  A stone may remain in the common bile duct.  A bile leak from the cyst duct that is clipped when your gallbladder is removed.  The need to convert to open surgery, which requires a larger incision in the abdomen. This may be necessary if your surgeon thinks it is not safe to continue with a laparoscopic procedure. BEFORE THE PROCEDURE  Ask your health care provider about changing or stopping any regular medicines. You will need to stop taking aspirin or blood thinners at least 5 days prior to surgery.  Do not eat or drink anything after midnight the night before surgery.  Let your health care provider know if you develop a cold or other infectious problem before surgery. PROCEDURE   You will be given medicine to make you sleep through the procedure (general anesthetic). A breathing tube will be placed in your mouth.  When you are asleep, your  surgeon will make several small cuts (incisions) in your abdomen.  A thin, lighted tube with a tiny camera on the end (laparoscope) is inserted through one of the small incisions. The camera on the laparoscope sends a picture to a TV screen in the operating room. This gives the surgeon a good view inside your abdomen.  A gas will be pumped into your abdomen. This expands your abdomen so that the surgeon has more room to perform the surgery.  Other tools needed for the procedure are inserted through the other incisions. The gallbladder is removed through one of the incisions.  After the removal of your gallbladder, the incisions will be closed with stitches, staples, or skin glue. AFTER THE PROCEDURE  You will be taken to a recovery area where your progress will be checked often.  You may be allowed to go home the same day if your pain is controlled and you can tolerate liquids. Document Released: 05/19/2005 Document Revised: 03/09/2013 Document Reviewed:  12/29/2012 [employer] Patient Information 2015 [place], [place]. This information is not intended to replace advice given to you by your health care provider. Make sure you discuss any questions you have with your health care provider. Laparoscopic Cholecystectomy, Care After Refer to this sheet in the next few weeks. These instructions provide you with information on caring for yourself after your procedure. Your health care provider may also give you more specific instructions. Your treatment has been planned according to current medical practices, but problems sometimes occur. Call your health care provider if you have any problems or questions after your procedure. WHAT TO EXPECT AFTER THE PROCEDURE After your procedure, it is typical to have the following:  Pain at your incision sites. You will be given pain medicines to control the pain.  Mild nausea or vomiting. This should improve after the first 24 hours.  Bloating and possibly  shoulder pain from the gas used during the procedure. This will improve after the first 24 hours. HOME CARE INSTRUCTIONS   Change bandages (dressings) as directed by your health care provider.  Keep the wound dry and clean. You may wash the wound gently with soap and water. Gently blot or dab the area dry.  Do not take baths or use swimming pools or hot tubs for 2 weeks or until your health care provider approves.  Only take over-the-counter or prescription medicines as directed by your health care provider.  Continue your normal diet as directed by your health care provider.  Do not lift anything heavier than 10 pounds (4.5 kg) until your health care provider approves.  Do not play contact sports for 1 week or until your health care provider approves. SEEK MEDICAL CARE IF:   You have redness, swelling, or increasing pain in the wound.  You notice yellowish-white fluid (pus) coming from the wound.  You have drainage from the wound that lasts longer than 1 day.  You notice a bad smell coming from the wound or dressing.  Your surgical cuts (incisions) break open. SEEK IMMEDIATE MEDICAL CARE IF:   You develop a rash.  You have difficulty breathing.  You have chest pain.  You have a fever.  You have increasing pain in the shoulders (shoulder strap areas).  You have dizzy episodes or faint while standing.  You have severe abdominal pain.  You feel sick to your stomach (nauseous) or throw up (vomit) and this lasts for more than 1 day. Document Released: 05/19/2005 Document Revised: 03/09/2013 Document Reviewed: 12/29/2012 [facility] Patient Information 2015 [place], [place]. This information is not intended to replace advice given to you by your health care provider. Make sure you discuss any questions you have with your health care provider.  Low-Fat Diet for Pancreatitis or Gallbladder Conditions A low-fat diet can be helpful if you have pancreatitis or a gallbladder  condition. With these conditions, your pancreas and gallbladder have trouble digesting fats. A healthy eating plan with less fat will help rest your pancreas and gallbladder and reduce your symptoms. WHAT DO I NEED TO KNOW ABOUT THIS DIET?  Eat a low-fat diet.  Reduce your fat intake to less than 20-30% of your total daily calories. This is less than 50-60 g of fat per day.  Remember that you need some fat in your diet. Ask your dietician what your daily goal should be.  Choose nonfat and low-fat healthy foods. Look for the words "nonfat," "low fat," or "fat free."  As a guide, look on the label and choose foods  with less than 3 g of fat per serving. Eat only one serving.  Avoid alcohol.  Do not smoke. If you need help quitting, talk with your health care provider.  Eat small frequent meals instead of three large heavy meals. WHAT FOODS CAN I EAT? Grains Include healthy grains and starches such as potatoes, wheat bread, fiber-rich cereal, and brown rice. Choose whole grain options whenever possible. In adults, whole grains should account for 45-65% of your daily calories.  Fruits and Vegetables Eat plenty of fruits and vegetables. Fresh fruits and vegetables add fiber to your diet. Meats and Other Protein Sources Eat lean meat such as chicken and pork. Trim any fat off of meat before cooking it. Eggs, fish, and beans are other sources of protein. In adults, these foods should account for 10-35% of your daily calories. Dairy Choose low-fat milk and dairy options. Dairy includes fat and protein, as well as calcium.  Fats and Oils Limit high-fat foods such as fried foods, sweets, baked goods, sugary drinks.  Other Creamy sauces and condiments, such as mayonnaise, can add extra fat. Think about whether or not you need to use them, or use smaller amounts or low fat options. WHAT FOODS ARE NOT RECOMMENDED?  High fat foods, such as:  [employer].  Ice cream.  [place] toast.  Sweet  rolls.  Pizza.  Cheese bread.  Foods covered with batter, butter, creamy sauces, or cheese.  Fried foods.  Sugary drinks and desserts.  Foods that cause gas or bloating Document Released: 05/24/2013 Document Reviewed: 05/24/2013 [facility] Patient Information 2015 [place], [place]. This information is not intended to replace advice given to you by your health care provider. Make sure you discuss any questions you have with your health care provider.

## 2014-02-08 ENCOUNTER — Inpatient Hospital Stay (HOSPITAL_COMMUNITY)
Admission: RE | Admit: 2014-02-08 | Discharge: 2014-02-10 | DRG: 417 | Disposition: A | Discharge status: Home / Self Care | Payer: Medicare HMO | Source: Ambulatory Visit | Attending: General Surgery | Admitting: General Surgery

## 2014-02-08 ENCOUNTER — Encounter (HOSPITAL_COMMUNITY)
Admission: RE | Disposition: A | Discharge status: Home / Self Care | Payer: Self-pay | Source: Ambulatory Visit | Attending: General Surgery

## 2014-02-08 ENCOUNTER — Ambulatory Visit (HOSPITAL_COMMUNITY): Payer: Medicare HMO | Admitting: Anesthesiology

## 2014-02-08 ENCOUNTER — Encounter (HOSPITAL_COMMUNITY): Payer: Medicare HMO | Admitting: Anesthesiology

## 2014-02-08 ENCOUNTER — Encounter (HOSPITAL_COMMUNITY): Payer: Self-pay | Admitting: *Deleted

## 2014-02-08 ENCOUNTER — Ambulatory Visit (HOSPITAL_COMMUNITY): Payer: Medicare HMO

## 2014-02-08 DIAGNOSIS — E785 Hyperlipidemia, unspecified: Secondary | ICD-10-CM | POA: Diagnosis present

## 2014-02-08 DIAGNOSIS — I498 Other specified cardiac arrhythmias: Secondary | ICD-10-CM | POA: Diagnosis not present

## 2014-02-08 DIAGNOSIS — I10 Essential (primary) hypertension: Secondary | ICD-10-CM | POA: Diagnosis present

## 2014-02-08 DIAGNOSIS — I509 Heart failure, unspecified: Secondary | ICD-10-CM | POA: Diagnosis present

## 2014-02-08 DIAGNOSIS — J9620 Acute and chronic respiratory failure, unspecified whether with hypoxia or hypercapnia: Secondary | ICD-10-CM | POA: Diagnosis not present

## 2014-02-08 DIAGNOSIS — J45909 Unspecified asthma, uncomplicated: Secondary | ICD-10-CM | POA: Diagnosis present

## 2014-02-08 DIAGNOSIS — G4733 Obstructive sleep apnea (adult) (pediatric): Secondary | ICD-10-CM | POA: Diagnosis present

## 2014-02-08 DIAGNOSIS — H3530 Unspecified macular degeneration: Secondary | ICD-10-CM | POA: Diagnosis present

## 2014-02-08 DIAGNOSIS — E662 Morbid (severe) obesity with alveolar hypoventilation: Secondary | ICD-10-CM | POA: Diagnosis present

## 2014-02-08 DIAGNOSIS — Z8249 Family history of ischemic heart disease and other diseases of the circulatory system: Secondary | ICD-10-CM

## 2014-02-08 DIAGNOSIS — K8010 Calculus of gallbladder with chronic cholecystitis without obstruction: Principal | ICD-10-CM | POA: Diagnosis present

## 2014-02-08 DIAGNOSIS — Z833 Family history of diabetes mellitus: Secondary | ICD-10-CM

## 2014-02-08 DIAGNOSIS — F411 Generalized anxiety disorder: Secondary | ICD-10-CM | POA: Diagnosis present

## 2014-02-08 DIAGNOSIS — T884XXA Failed or difficult intubation, initial encounter: Secondary | ICD-10-CM

## 2014-02-08 DIAGNOSIS — Z87891 Personal history of nicotine dependence: Secondary | ICD-10-CM

## 2014-02-08 DIAGNOSIS — Z6841 Body Mass Index (BMI) 40.0 and over, adult: Secondary | ICD-10-CM

## 2014-02-08 DIAGNOSIS — K811 Chronic cholecystitis: Secondary | ICD-10-CM | POA: Diagnosis present

## 2014-02-08 DIAGNOSIS — M129 Arthropathy, unspecified: Secondary | ICD-10-CM | POA: Diagnosis present

## 2014-02-08 DIAGNOSIS — E039 Hypothyroidism, unspecified: Secondary | ICD-10-CM | POA: Diagnosis present

## 2014-02-08 DIAGNOSIS — E119 Type 2 diabetes mellitus without complications: Secondary | ICD-10-CM | POA: Diagnosis present

## 2014-02-08 HISTORY — DX: Failed or difficult intubation, initial encounter: T88.4XXA

## 2014-02-08 HISTORY — PX: CHOLECYSTECTOMY: SHX55

## 2014-02-08 LAB — BLOOD GAS, ARTERIAL
Acid-Base Excess: 2.7 mmol/L — ABNORMAL HIGH (ref 0.0–2.0)
Bicarbonate: 26.4 mEq/L — ABNORMAL HIGH (ref 20.0–24.0)
Drawn by: 234301
FIO2: 100 %
MECHVT: 600 mL
O2 SAT: 98.3 %
PEEP: 5 cmH2O
Patient temperature: 37
RATE: 15 resp/min
TCO2: 23.4 mmol/L (ref 0–100)
pCO2 arterial: 37.8 mmHg (ref 35.0–45.0)
pH, Arterial: 7.458 — ABNORMAL HIGH (ref 7.350–7.450)
pO2, Arterial: 112 mmHg — ABNORMAL HIGH (ref 80.0–100.0)

## 2014-02-08 LAB — BASIC METABOLIC PANEL
Anion gap: 15 (ref 5–15)
BUN: 13 mg/dL (ref 6–23)
CO2: 27 mEq/L (ref 19–32)
Calcium: 9.1 mg/dL (ref 8.4–10.5)
Chloride: 99 mEq/L (ref 96–112)
Creatinine, Ser: 1.05 mg/dL (ref 0.50–1.10)
GFR calc Af Amer: 62 mL/min — ABNORMAL LOW (ref >90)
GFR, EST NON AFRICAN AMERICAN: 53 mL/min — AB (ref >90)
GLUCOSE: 209 mg/dL — AB (ref 70–99)
Potassium: 3.9 mEq/L (ref 3.7–5.3)
Sodium: 141 mEq/L (ref 137–147)

## 2014-02-08 LAB — HEPATIC FUNCTION PANEL
ALBUMIN: 3.1 g/dL — AB (ref 3.5–5.2)
ALK PHOS: 64 U/L (ref 39–117)
ALT: 17 U/L (ref 0–35)
AST: 25 U/L (ref 0–37)
BILIRUBIN TOTAL: 0.6 mg/dL (ref 0.3–1.2)
Bilirubin, Direct: <0.2 mg/dL (ref 0.0–0.3)
Total Protein: 6.7 g/dL (ref 6.0–8.3)

## 2014-02-08 LAB — CBC WITH DIFFERENTIAL/PLATELET
Basophils Absolute: 0 10*3/uL (ref 0.0–0.1)
Basophils Relative: 0 % (ref 0–1)
Eosinophils Absolute: 0 10*3/uL (ref 0.0–0.7)
Eosinophils Relative: 0 % (ref 0–5)
HEMATOCRIT: 36.1 % (ref 36.0–46.0)
HEMOGLOBIN: 11.7 g/dL — AB (ref 12.0–15.0)
LYMPHS ABS: 1.2 10*3/uL (ref 0.7–4.0)
Lymphocytes Relative: 10 % — ABNORMAL LOW (ref 12–46)
MCH: 29.2 pg (ref 26.0–34.0)
MCHC: 32.4 g/dL (ref 30.0–36.0)
MCV: 90 fL (ref 78.0–100.0)
MONOS PCT: 2 % — AB (ref 3–12)
Monocytes Absolute: 0.2 10*3/uL (ref 0.1–1.0)
NEUTROS PCT: 88 % — AB (ref 43–77)
Neutro Abs: 10.4 10*3/uL — ABNORMAL HIGH (ref 1.7–7.7)
Platelets: 273 10*3/uL (ref 150–400)
RBC: 4.01 MIL/uL (ref 3.87–5.11)
RDW: 14.1 % (ref 11.5–15.5)
WBC: 11.8 10*3/uL — ABNORMAL HIGH (ref 4.0–10.5)

## 2014-02-08 LAB — GLUCOSE, CAPILLARY
GLUCOSE-CAPILLARY: 161 mg/dL — AB (ref 70–99)
GLUCOSE-CAPILLARY: 243 mg/dL — AB (ref 70–99)
Glucose-Capillary: 175 mg/dL — ABNORMAL HIGH (ref 70–99)
Glucose-Capillary: 165 mg/dL — ABNORMAL HIGH (ref 70–99)

## 2014-02-08 LAB — MRSA PCR SCREENING: MRSA by PCR: NEGATIVE

## 2014-02-08 SURGERY — LAPAROSCOPIC CHOLECYSTECTOMY
Anesthesia: General | Site: Abdomen

## 2014-02-08 MED ORDER — LACTATED RINGERS IV SOLN
INTRAVENOUS | Status: DC
  Administered 2014-02-08: 12:00:00 via INTRAVENOUS
  Administered 2014-02-08: 1000 mL via INTRAVENOUS

## 2014-02-08 MED ORDER — GLYCOPYRROLATE 0.2 MG/ML IJ SOLN
INTRAMUSCULAR | Status: AC
  Filled 2014-02-08: qty 4

## 2014-02-08 MED ORDER — POVIDONE-IODINE 10 % EX OINT
TOPICAL_OINTMENT | CUTANEOUS | Status: AC
  Filled 2014-02-08: qty 1

## 2014-02-08 MED ORDER — FENTANYL CITRATE 0.05 MG/ML IJ SOLN
INTRAMUSCULAR | Status: DC | PRN
  Administered 2014-02-08 (×2): 50 ug via INTRAVENOUS

## 2014-02-08 MED ORDER — MIDAZOLAM HCL 2 MG/2ML IJ SOLN
INTRAMUSCULAR | Status: AC
  Filled 2014-02-08: qty 2

## 2014-02-08 MED ORDER — SUCCINYLCHOLINE CHLORIDE 20 MG/ML IJ SOLN
INTRAMUSCULAR | Status: AC
  Filled 2014-02-08: qty 1

## 2014-02-08 MED ORDER — ONDANSETRON HCL 4 MG PO TABS: 4.0000 mg | ORAL_TABLET | Freq: Four times a day (QID) | ORAL | Status: DC | PRN

## 2014-02-08 MED ORDER — ONDANSETRON HCL 4 MG/2ML IJ SOLN
INTRAMUSCULAR | Status: AC
  Filled 2014-02-08: qty 2

## 2014-02-08 MED ORDER — NEOSTIGMINE METHYLSULFATE 10 MG/10ML IV SOLN
INTRAVENOUS | Status: AC
  Filled 2014-02-08: qty 2

## 2014-02-08 MED ORDER — GLYCOPYRROLATE 0.2 MG/ML IJ SOLN
INTRAMUSCULAR | Status: DC | PRN
  Administered 2014-02-08: .8 mg via INTRAVENOUS

## 2014-02-08 MED ORDER — ROCURONIUM BROMIDE 50 MG/5ML IV SOLN
INTRAVENOUS | Status: AC
  Filled 2014-02-08: qty 1

## 2014-02-08 MED ORDER — GABAPENTIN 100 MG PO CAPS: 100.0000 mg | ORAL_CAPSULE | Freq: Three times a day (TID) | ORAL | Status: DC

## 2014-02-08 MED ORDER — MIDAZOLAM HCL 2 MG/2ML IJ SOLN
1.0000 mg | INTRAMUSCULAR | Status: DC | PRN
  Administered 2014-02-08: 2 mg via INTRAVENOUS

## 2014-02-08 MED ORDER — NIACIN ER (ANTIHYPERLIPIDEMIC) 500 MG PO TBCR
500.0000 mg | EXTENDED_RELEASE_TABLET | Freq: Two times a day (BID) | ORAL | Status: DC
  Filled 2014-02-08 (×4): qty 1

## 2014-02-08 MED ORDER — ALBUTEROL SULFATE (2.5 MG/3ML) 0.083% IN NEBU
2.5000 mg | INHALATION_SOLUTION | RESPIRATORY_TRACT | Status: DC | PRN
  Administered 2014-02-08 – 2014-02-09 (×3): 2.5 mg via RESPIRATORY_TRACT
  Filled 2014-02-08 (×3): qty 3

## 2014-02-08 MED ORDER — KETOROLAC TROMETHAMINE 30 MG/ML IJ SOLN
30.0000 mg | Freq: Once | INTRAMUSCULAR | Status: AC
  Administered 2014-02-08: 30 mg via INTRAVENOUS

## 2014-02-08 MED ORDER — POVIDONE-IODINE 10 % OINT PACKET
TOPICAL_OINTMENT | CUTANEOUS | Status: DC | PRN
  Administered 2014-02-08: 1 via TOPICAL

## 2014-02-08 MED ORDER — PROPOFOL 10 MG/ML IV EMUL
INTRAVENOUS | Status: AC
  Filled 2014-02-08: qty 20

## 2014-02-08 MED ORDER — HEMOSTATIC AGENTS (NO CHARGE) OPTIME
TOPICAL | Status: DC | PRN
  Administered 2014-02-08: 1 via TOPICAL

## 2014-02-08 MED ORDER — SODIUM CHLORIDE 0.9 % IV SOLN
1500.0000 mg | INTRAVENOUS | Status: AC
  Administered 2014-02-08: 1500 mg via INTRAVENOUS
  Filled 2014-02-08: qty 1500

## 2014-02-08 MED ORDER — ALBUTEROL SULFATE HFA 108 (90 BASE) MCG/ACT IN AERS
INHALATION_SPRAY | RESPIRATORY_TRACT | Status: AC
  Filled 2014-02-08: qty 6.7

## 2014-02-08 MED ORDER — FUROSEMIDE 80 MG PO TABS: 80.0000 mg | ORAL_TABLET | Freq: Every day | ORAL | Status: DC

## 2014-02-08 MED ORDER — ENOXAPARIN SODIUM 40 MG/0.4ML ~~LOC~~ SOLN
40.0000 mg | SUBCUTANEOUS | Status: DC
  Administered 2014-02-09 – 2014-02-10 (×2): 40 mg via SUBCUTANEOUS
  Filled 2014-02-08 (×2): qty 0.4

## 2014-02-08 MED ORDER — BUPIVACAINE HCL (PF) 0.5 % IJ SOLN
INTRAMUSCULAR | Status: DC | PRN
  Administered 2014-02-08: 10 mL

## 2014-02-08 MED ORDER — MIDAZOLAM HCL 2 MG/2ML IJ SOLN
2.0000 mg | INTRAMUSCULAR | Status: DC | PRN
  Administered 2014-02-08 – 2014-02-09 (×2): 2 mg via INTRAVENOUS
  Filled 2014-02-08 (×2): qty 2

## 2014-02-08 MED ORDER — PROPOFOL 10 MG/ML IV BOLUS
INTRAVENOUS | Status: DC | PRN
  Administered 2014-02-08: 150 mg via INTRAVENOUS
  Administered 2014-02-08: 50 mg via INTRAVENOUS

## 2014-02-08 MED ORDER — ENOXAPARIN SODIUM 40 MG/0.4ML ~~LOC~~ SOLN
40.0000 mg | Freq: Once | SUBCUTANEOUS | Status: AC
  Administered 2014-02-08: 40 mg via SUBCUTANEOUS
  Filled 2014-02-08: qty 0.4

## 2014-02-08 MED ORDER — NEOSTIGMINE METHYLSULFATE 10 MG/10ML IV SOLN
INTRAVENOUS | Status: DC | PRN
  Administered 2014-02-08 (×2): 1 mg via INTRAVENOUS
  Administered 2014-02-08: 4 mg via INTRAVENOUS

## 2014-02-08 MED ORDER — FENTANYL CITRATE 0.05 MG/ML IJ SOLN
50.0000 ug | INTRAMUSCULAR | Status: DC | PRN
  Administered 2014-02-08 – 2014-02-09 (×6): 50 ug via INTRAVENOUS
  Filled 2014-02-08 (×6): qty 2

## 2014-02-08 MED ORDER — NEOSTIGMINE METHYLSULFATE 10 MG/10ML IV SOLN
INTRAVENOUS | Status: AC
  Filled 2014-02-08: qty 1

## 2014-02-08 MED ORDER — CHLORHEXIDINE GLUCONATE 0.12 % MT SOLN
15.0000 mL | Freq: Two times a day (BID) | OROMUCOSAL | Status: DC
  Administered 2014-02-08 – 2014-02-09 (×2): 15 mL via OROMUCOSAL
  Filled 2014-02-08 (×2): qty 15

## 2014-02-08 MED ORDER — LIDOCAINE HCL (CARDIAC) 10 MG/ML IV SOLN
INTRAVENOUS | Status: DC | PRN
  Administered 2014-02-08: 40 mg via INTRAVENOUS

## 2014-02-08 MED ORDER — KETOROLAC TROMETHAMINE 30 MG/ML IJ SOLN
INTRAMUSCULAR | Status: AC
  Filled 2014-02-08: qty 1

## 2014-02-08 MED ORDER — HYDROCODONE-ACETAMINOPHEN 7.5-325 MG PO TABS: 1.0000 | ORAL_TABLET | Freq: Four times a day (QID) | ORAL | Status: DC | PRN

## 2014-02-08 MED ORDER — ENALAPRIL MALEATE 5 MG PO TABS: 10.0000 mg | ORAL_TABLET | Freq: Every day | ORAL | Status: DC

## 2014-02-08 MED ORDER — FENTANYL CITRATE 0.05 MG/ML IJ SOLN
INTRAMUSCULAR | Status: AC
  Filled 2014-02-08: qty 2

## 2014-02-08 MED ORDER — CARVEDILOL 12.5 MG PO TABS: 25.0000 mg | ORAL_TABLET | Freq: Two times a day (BID) | ORAL | Status: DC

## 2014-02-08 MED ORDER — POVIDONE-IODINE 10 % EX OINT
TOPICAL_OINTMENT | CUTANEOUS | Status: AC
  Filled 2014-02-08: qty 2

## 2014-02-08 MED ORDER — ONDANSETRON HCL 4 MG/2ML IJ SOLN: 4.0000 mg | Freq: Four times a day (QID) | INTRAMUSCULAR | Status: DC | PRN

## 2014-02-08 MED ORDER — LEVOTHYROXINE SODIUM 100 MCG IV SOLR
100.0000 ug | Freq: Every day | INTRAVENOUS | Status: DC
  Administered 2014-02-08 – 2014-02-09 (×2): 100 ug via INTRAVENOUS
  Filled 2014-02-08 (×3): qty 5

## 2014-02-08 MED ORDER — LACTATED RINGERS IV SOLN
INTRAVENOUS | Status: DC
  Administered 2014-02-08 – 2014-02-09 (×2): via INTRAVENOUS

## 2014-02-08 MED ORDER — CETYLPYRIDINIUM CHLORIDE 0.05 % MT LIQD
7.0000 mL | Freq: Four times a day (QID) | OROMUCOSAL | Status: DC
  Administered 2014-02-08 – 2014-02-09 (×4): 7 mL via OROMUCOSAL

## 2014-02-08 MED ORDER — BUPIVACAINE HCL (PF) 0.5 % IJ SOLN
INTRAMUSCULAR | Status: AC
  Filled 2014-02-08: qty 30

## 2014-02-08 MED ORDER — TRAMADOL HCL 50 MG PO TABS: 100.0000 mg | ORAL_TABLET | Freq: Four times a day (QID) | ORAL | Status: DC | PRN

## 2014-02-08 MED ORDER — INSULIN ASPART 100 UNIT/ML ~~LOC~~ SOLN
0.0000 [IU] | Freq: Three times a day (TID) | SUBCUTANEOUS | Status: DC
Start: 2014-02-08 — End: 2014-02-10
  Administered 2014-02-08: 4 [IU] via SUBCUTANEOUS
  Administered 2014-02-09: 3 [IU] via SUBCUTANEOUS
  Administered 2014-02-09 – 2014-02-10 (×3): 4 [IU] via SUBCUTANEOUS

## 2014-02-08 MED ORDER — LIDOCAINE HCL (PF) 1 % IJ SOLN
INTRAMUSCULAR | Status: AC
  Filled 2014-02-08: qty 5

## 2014-02-08 MED ORDER — METFORMIN HCL 500 MG PO TABS: 500.0000 mg | ORAL_TABLET | Freq: Three times a day (TID) | ORAL | Status: DC

## 2014-02-08 MED ORDER — ONDANSETRON HCL 4 MG/2ML IJ SOLN
4.0000 mg | Freq: Once | INTRAMUSCULAR | Status: AC
  Administered 2014-02-08: 4 mg via INTRAVENOUS

## 2014-02-08 MED ORDER — ROCURONIUM BROMIDE 100 MG/10ML IV SOLN
INTRAVENOUS | Status: DC | PRN
  Administered 2014-02-08 (×2): 10 mg via INTRAVENOUS
  Administered 2014-02-08: 20 mg via INTRAVENOUS

## 2014-02-08 MED ORDER — ONDANSETRON HCL 4 MG/2ML IJ SOLN
4.0000 mg | Freq: Once | INTRAMUSCULAR | Status: AC | PRN
Start: 2014-02-08 — End: 2014-02-08
  Administered 2014-02-08: 4 mg via INTRAVENOUS
  Filled 2014-02-08: qty 2

## 2014-02-08 MED ORDER — GLIPIZIDE 5 MG PO TABS: 2.5000 mg | ORAL_TABLET | Freq: Every day | ORAL | Status: DC | PRN

## 2014-02-08 MED ORDER — FENTANYL CITRATE 0.05 MG/ML IJ SOLN: 25.0000 ug | INTRAMUSCULAR | Status: DC | PRN

## 2014-02-08 MED ORDER — DEXAMETHASONE SODIUM PHOSPHATE 4 MG/ML IJ SOLN
4.0000 mg | Freq: Once | INTRAMUSCULAR | Status: AC
  Administered 2014-02-08: 4 mg via INTRAVENOUS

## 2014-02-08 MED ORDER — CHLORHEXIDINE GLUCONATE 4 % EX LIQD: 1.0000 "application " | Freq: Once | CUTANEOUS | Status: DC

## 2014-02-08 MED ORDER — EZETIMIBE 10 MG PO TABS: 10.0000 mg | ORAL_TABLET | Freq: Every day | ORAL | Status: DC

## 2014-02-08 MED ORDER — GLYCOPYRROLATE 0.2 MG/ML IJ SOLN
INTRAMUSCULAR | Status: AC
  Filled 2014-02-08: qty 2

## 2014-02-08 MED ORDER — DEXAMETHASONE SODIUM PHOSPHATE 4 MG/ML IJ SOLN
INTRAMUSCULAR | Status: AC
  Filled 2014-02-08: qty 1

## 2014-02-08 MED ORDER — ONDANSETRON HCL 4 MG/2ML IJ SOLN
INTRAMUSCULAR | Status: DC | PRN
  Administered 2014-02-08: 4 mg via INTRAVENOUS

## 2014-02-08 MED ORDER — SUCCINYLCHOLINE CHLORIDE 20 MG/ML IJ SOLN
INTRAMUSCULAR | Status: DC | PRN
  Administered 2014-02-08: 140 mg via INTRAVENOUS
  Administered 2014-02-08: 100 mg via INTRAVENOUS

## 2014-02-08 MED ORDER — ISOSORBIDE MONONITRATE ER 30 MG PO TB24: 30.0000 mg | ORAL_TABLET | Freq: Every day | ORAL | Status: DC

## 2014-02-08 MED ORDER — EPHEDRINE SULFATE 50 MG/ML IJ SOLN
INTRAMUSCULAR | Status: DC | PRN
Start: 2014-02-08 — End: 2014-02-08
  Administered 2014-02-08 (×4): 10 mg via INTRAVENOUS

## 2014-02-08 MED ORDER — EPHEDRINE SULFATE 50 MG/ML IJ SOLN
INTRAMUSCULAR | Status: AC
Start: 2014-02-08 — End: 2014-02-08
  Filled 2014-02-08: qty 1

## 2014-02-08 MED ORDER — LEVOTHYROXINE SODIUM 112 MCG PO TABS: 112.0000 ug | ORAL_TABLET | Freq: Every day | ORAL | Status: DC

## 2014-02-08 MED ORDER — ENALAPRILAT 1.25 MG/ML IV SOLN
1.2500 mg | Freq: Four times a day (QID) | INTRAVENOUS | Status: DC
  Administered 2014-02-08 – 2014-02-09 (×4): 1.25 mg via INTRAVENOUS
  Filled 2014-02-08 (×4): qty 2

## 2014-02-08 MED ORDER — SODIUM CHLORIDE 0.9 % IR SOLN
Status: DC | PRN
  Administered 2014-02-08: 1000 mL

## 2014-02-08 SURGICAL SUPPLY — 41 items
APPLIER CLIP LAPSCP 10X32 DD (CLIP) ×3 IMPLANT
BAG HAMPER (MISCELLANEOUS) ×3 IMPLANT
BAG SPEC RTRVL LRG 6X4 10 (ENDOMECHANICALS) ×1
CLOTH BEACON ORANGE TIMEOUT ST (SAFETY) ×3 IMPLANT
COVER LIGHT HANDLE STERIS (MISCELLANEOUS) ×6 IMPLANT
DECANTER SPIKE VIAL GLASS SM (MISCELLANEOUS) ×3 IMPLANT
DURAPREP 26ML APPLICATOR (WOUND CARE) ×3 IMPLANT
ELECT REM PT RETURN 9FT ADLT (ELECTROSURGICAL) ×3
ELECTRODE REM PT RTRN 9FT ADLT (ELECTROSURGICAL) ×1 IMPLANT
FILTER SMOKE EVAC LAPAROSHD (FILTER) ×3 IMPLANT
FORMALIN 10 PREFIL 120ML (MISCELLANEOUS) ×3 IMPLANT
GLOVE BIOGEL M 7.0 STRL (GLOVE) ×2 IMPLANT
GLOVE BIOGEL PI IND STRL 7.0 (GLOVE) IMPLANT
GLOVE BIOGEL PI IND STRL 7.5 (GLOVE) IMPLANT
GLOVE BIOGEL PI INDICATOR 7.0 (GLOVE) ×2
GLOVE BIOGEL PI INDICATOR 7.5 (GLOVE) ×4
GLOVE SURG SS PI 7.5 STRL IVOR (GLOVE) ×7 IMPLANT
GOWN STRL REUS W/TWL LRG LVL3 (GOWN DISPOSABLE) ×11 IMPLANT
HEMOSTAT SNOW SURGICEL 2X4 (HEMOSTASIS) ×3 IMPLANT
INST SET LAPROSCOPIC AP (KITS) ×3 IMPLANT
KIT ROOM TURNOVER APOR (KITS) ×3 IMPLANT
MANIFOLD NEPTUNE II (INSTRUMENTS) ×3 IMPLANT
NDL INSUFFLATION 14GA 120MM (NEEDLE) ×1 IMPLANT
NEEDLE INSUFFLATION 14GA 120MM (NEEDLE) ×3 IMPLANT
NS IRRIG 1000ML POUR BTL (IV SOLUTION) ×3 IMPLANT
PACK LAP CHOLE LZT030E (CUSTOM PROCEDURE TRAY) ×3 IMPLANT
PAD ARMBOARD 7.5X6 YLW CONV (MISCELLANEOUS) ×3 IMPLANT
POUCH SPECIMEN RETRIEVAL 10MM (ENDOMECHANICALS) ×3 IMPLANT
SET BASIN LINEN APH (SET/KITS/TRAYS/PACK) ×3 IMPLANT
SLEEVE ENDOPATH XCEL 5M (ENDOMECHANICALS) ×3 IMPLANT
SPONGE GAUZE 2X2 8PLY STER LF (GAUZE/BANDAGES/DRESSINGS) ×4
SPONGE GAUZE 2X2 8PLY STRL LF (GAUZE/BANDAGES/DRESSINGS) ×8 IMPLANT
STAPLER VISISTAT (STAPLE) ×3 IMPLANT
SUT VICRYL 0 UR6 27IN ABS (SUTURE) ×3 IMPLANT
TAPE CLOTH SURG 4X10 WHT LF (GAUZE/BANDAGES/DRESSINGS) ×2 IMPLANT
TROCAR ENDO BLADELESS 11MM (ENDOMECHANICALS) ×3 IMPLANT
TROCAR XCEL NON-BLD 5MMX100MML (ENDOMECHANICALS) ×3 IMPLANT
TROCAR XCEL UNIV SLVE 11M 100M (ENDOMECHANICALS) ×3 IMPLANT
TUBING INSUFFLATION (TUBING) ×3 IMPLANT
WARMER LAPAROSCOPE (MISCELLANEOUS) ×3 IMPLANT
YANKAUER SUCT 12FT TUBE ARGYLE (SUCTIONS) ×3 IMPLANT

## 2014-02-08 NOTE — Anesthesia Postprocedure Evaluation (Signed)
  Anesthesia Post-op Note  Patient: Samantha [name]  Procedure(s) Performed: Procedure(s): LAPAROSCOPIC CHOLECYSTECTOMY (N/A)  Patient Location: PACU  Anesthesia Type:General  Level of Consciousness: responds to stimulation and Patient remains intubated per anesthesia plan  Airway and Oxygen Therapy: Patient re-intubated, Patient remains intubated per anesthesia plan and Patient placed on Ventilator (see vital sign flow sheet for setting)  Post-op Pain: none  Post-op Assessment: Post-op Vital signs reviewed, Patient's Cardiovascular Status Stable, Respiratory Function Stable, Patent Airway, No signs of Nausea or vomiting and Pain level controlled  Post-op Vital Signs: Reviewed and stable  Last Vitals:  Filed Vitals:   02/08/14 1045  BP: 115/71  Pulse: 92  Temp:   Resp: 27    Complications: Patient re-intubated

## 2014-02-08 NOTE — Interval H&P Note (Signed)
History and Physical Interval Note:  02/08/2014 8:41 AM  Samantha [name]  has presented today for surgery, with the diagnosis of chronic cholecystitis  The various methods of treatment have been discussed with the patient and family. After consideration of risks, benefits and other options for treatment, the patient has consented to  Procedure(s): LAPAROSCOPIC CHOLECYSTECTOMY (N/A) as a surgical intervention .  The patient's history has been reviewed, patient examined, no change in status, stable for surgery.  I have reviewed the patient's chart and labs.  Questions were answered to the patient's satisfaction.     Franky Macho A

## 2014-02-08 NOTE — Anesthesia Preprocedure Evaluation (Addendum)
Anesthesia Evaluation  Patient identified by MRN, date of birth, ID band Patient awake    Reviewed: Allergy & Precautions, H&P , NPO status , Patient's Chart, lab work & pertinent test results, reviewed documented beta blocker date and time   Airway Mallampati: II TM Distance: >3 FB     Dental  (+) Teeth Intact   Pulmonary sleep apnea , former smoker,  breath sounds clear to auscultation        Cardiovascular hypertension, Pt. on medications and Pt. on home beta blockers + CAD (neg cath), + Peripheral Vascular Disease and +CHF Rhythm:Regular Rate:Normal     Neuro/Psych    GI/Hepatic negative GI ROS,   Endo/Other  diabetes, Type obesity  Renal/GU      Musculoskeletal  (+) Arthritis -,   Abdominal   Peds  Hematology  (+) anemia ,   Anesthesia Other Findings   Reproductive/Obstetrics                         Anesthesia Physical Anesthesia Plan  ASA: III  Anesthesia Plan: General   Post-op Pain Management:    Induction: Intravenous, Rapid sequence and Cricoid pressure planned  Airway Management Planned: Oral ETT  Additional Equipment:   Intra-op Plan:   Post-operative Plan: Extubation in OR and Possible Post-op intubation/ventilation  Informed Consent: I have reviewed the patients History and Physical, chart, labs and discussed the procedure including the risks, benefits and alternatives for the proposed anesthesia with the patient or authorized representative who has indicated his/her understanding and acceptance.     Plan Discussed with:   Anesthesia Plan Comments: (Pt had to be re-intubated in PACU. Alert and oriented, but was having difficulity breathing and keeping 02Sat up.)      Anesthesia Quick Evaluation

## 2014-02-08 NOTE — Progress Notes (Signed)
Patient transported from PACU to ICU on ventilator, no complications noted. RN and anestiologist assisted with transport.

## 2014-02-08 NOTE — Anesthesia Postprocedure Evaluation (Addendum)
  Anesthesia Post-op Note  Patient: Samantha [name]    Pt is a morbidly obese female with known hx of OSA, non-compliant with CPAP therapy. She had marginal ventilation post surgery and decided to attempt a trail od extubation to see if ventilation would improve without the ETT. Arrival in PACU showed no improvement and continued hypoventilation, so it was decided to reintubate and mechanically ventilate at least overnight. I personally reintubated with the glidescope, verfied with CO2 and CXR. Pt remained stable throughout this event. Transfer to ICU, and consult with pulmonologist pending.

## 2014-02-08 NOTE — Addendum Note (Signed)
Addendum created 02/08/14 1304 by Shary Decamp, CRNA   Modules edited: Anesthesia Flowsheet

## 2014-02-08 NOTE — Addendum Note (Signed)
Addendum created 02/08/14 1258 by Laurene Footman, MD   Modules edited: Notes Section   Notes Section:  File: [id number]; File: [id number]

## 2014-02-08 NOTE — Anesthesia Procedure Notes (Signed)
Procedure Name: Intubation Date/Time: 02/08/2014 8:53 AM Performed by: Patrcia Dolly, Latish Toutant Pre-anesthesia Checklist: Patient identified, Patient being monitored, Timeout performed, Emergency Drugs available and Suction available Patient Re-evaluated:Patient Re-evaluated prior to inductionOxygen Delivery Method: Circle System Utilized Preoxygenation: Pre-oxygenation with 100% oxygen Intubation Type: IV induction, Rapid sequence and Cricoid Pressure applied Laryngoscope Size: Miller and 2 Grade View: Grade I Tube type: Oral Tube size: 7.0 mm Number of attempts: 1 Airway Equipment and Method: stylet Placement Confirmation: ETT inserted through vocal cords under direct vision,  positive ETCO2 and breath sounds checked- equal and bilateral Secured at: 21 cm Tube secured with: Tape Dental Injury: Teeth and Oropharynx as per pre-operative assessment

## 2014-02-08 NOTE — Consult Note (Signed)
Consult requested by: Dr. Lovell Sheehan Consult requested for respiratory failure:  HPI: This is a 68 year old Caucasian female who had been in her usual state of fair health at home when she developed increasing right upper quadrant abdominal pain nausea and heartburn. She was found to have abnormal gallbladder function and underwent cholecystectomy today. She initially did okay but then she developed hypoventilation and had reintubated. The plan is for her to stay on the ventilator overnight at least and see how she does. Her past medical history is positive for obstructive sleep apnea and apparently she does not wear her CPAP. She denies any other lung disease.  Past Medical History  Diagnosis Date  . Brain aneurysm   . CHF (congestive heart failure)   . Neuropathy   . Diabetes mellitus   . Anemia   . Thyroid disease   . Arthritis   . Macular degeneration   . Chest pain   . Hypertension   . Hyperlipidemia   . Obesity   . Abnormal stress test     -false positive, prob breast attenuation  . Hx of cardiac catheterization 11/2013    normal coronary arteries  . Sleep apnea     doesn't wear CPAP, sleeps in a lift chair  . Difficult intubation 02/08/14    re-intubated in PACU     Family History  Problem Relation Age of Onset  . Diabetes      family history   . Arthritis      family history   . Diabetes Mother   . Hypertension Mother   . Seizures Sister      History   Social History  . Marital Status: Married    Spouse Name: N/A    Number of Children: N/A  . Years of Education: 80   Social History Main Topics  . Smoking status: Former Smoker    Quit date: 07/17/2002  . Smokeless tobacco: None  . Alcohol Use: No  . Drug Use: No  . Sexual Activity: None   Other Topics Concern  . None   Social History Narrative  . None     ROS: Unobtainable    Objective: Vital signs in last 24 hours: Temp:  [97.8 F (36.6 C)] 97.8 F (36.6 C) (09/09 0733) Pulse Rate:  [81-109]  108 (09/09 1700) Resp:  [14-37] 16 (09/09 1700) BP: (96-131)/(59-88) 129/87 mmHg (09/09 1700) SpO2:  [92 %-100 %] 99 % (09/09 1700) FiO2 (%):  [50 %-100 %] 50 % (09/09 1516) Weight change:     Intake/Output from previous day:    PHYSICAL EXAM She is awake and alert intubated. She is on mechanical ventilation. She is obese. Her mucous membranes are moist. Her neck is supple. Pupils are reactive. Nose and throat are clear. Her chest shows clear. Her heart is regular without gallop. Her abdomen is soft. She does not have any edema. Central nervous system examination grossly intact  Lab Results: Basic Metabolic Panel:  Recent Labs  16/10/96 1300  NA 141  K 3.9  CL 99  CO2 27  GLUCOSE 209*  BUN 13  CREATININE 1.05  CALCIUM 9.1   Liver Function Tests:  Recent Labs  02/08/14 1300  AST 25  ALT 17  ALKPHOS 64  BILITOT 0.6  PROT 6.7  ALBUMIN 3.1*   No results found for this basename: LIPASE, AMYLASE,  in the last 72 hours No results found for this basename: AMMONIA,  in the last 72 hours CBC:  Recent Labs  02/08/14  1300  WBC 11.8*  NEUTROABS 10.4*  HGB 11.7*  HCT 36.1  MCV 90.0  PLT 273   Cardiac Enzymes: No results found for this basename: CKTOTAL, CKMB, CKMBINDEX, TROPONINI,  in the last 72 hours BNP: No results found for this basename: PROBNP,  in the last 72 hours D-Dimer: No results found for this basename: DDIMER,  in the last 72 hours CBG:  Recent Labs  02/08/14 0710 02/08/14 1056 02/08/14 1655  GLUCAP 161* 243* 175*   Hemoglobin A1C: No results found for this basename: HGBA1C,  in the last 72 hours Fasting Lipid Panel: No results found for this basename: CHOL, HDL, LDLCALC, TRIG, CHOLHDL, LDLDIRECT,  in the last 72 hours Thyroid Function Tests: No results found for this basename: TSH, T4TOTAL, FREET4, T3FREE, THYROIDAB,  in the last 72 hours Anemia Panel: No results found for this basename: VITAMINB12, FOLATE, FERRITIN, TIBC, IRON,  RETICCTPCT,  in the last 72 hours Coagulation: No results found for this basename: LABPROT, INR,  in the last 72 hours Urine Drug Screen: Drugs of Abuse  No results found for this basename: labopia, cocainscrnur, labbenz, amphetmu, thcu, labbarb    Alcohol Level: No results found for this basename: ETH,  in the last 72 hours Urinalysis: No results found for this basename: COLORURINE, APPERANCEUR, LABSPEC, PHURINE, GLUCOSEU, HGBUR, BILIRUBINUR, KETONESUR, PROTEINUR, UROBILINOGEN, NITRITE, LEUKOCYTESUR,  in the last 72 hours Misc. Labs:   ABGS:  Recent Labs  02/08/14 1220  PHART 7.458*  PO2ART 112.0*  TCO2 23.4  HCO3 26.4*     MICROBIOLOGY: Recent Results (from the past 240 hour(s))  MRSA PCR SCREENING     Status: None   Collection Time    02/08/14 12:10 PM      Result Value Ref Range Status   MRSA by PCR NEGATIVE  NEGATIVE Final   Comment:            The GeneXpert MRSA Assay (FDA     approved for NASAL specimens     only), is one component of a     comprehensive MRSA colonization     surveillance program. It is not     intended to diagnose MRSA     infection nor to guide or     monitor treatment for     MRSA infections.    Studies/Results: Dg Chest Port 1 View  02/08/2014   CLINICAL DATA:  68 year old female status post intubation. Initial encounter. Chronic cholecystitis.  EXAM: PORTABLE CHEST - 1 VIEW  COMPARISON:  Chest radiographs 10/17/2013.  FINDINGS: Portable AP semi upright view at 1046 hrs. Endotracheal tube tip in good position about midway between the clavicles and carina. Lower lung volumes. Partial obscuration of the left hemidiaphragm. Stable cardiac size and mediastinal contours. No pneumothorax. Small left pleural effusion is possible. Pulmonary vascular congestion without overt edema.  IMPRESSION: 1. Endotracheal tube in good position, tip between the clavicles and carina. 2. Vascular congestion, possible small left pleural effusion, and atelectasis  versus consolidation at the left lung base.   Electronically Signed   By: Augusto Gamble M.D.   On: 02/08/2014 11:05    Medications:  Prior to Admission:  Facility-administered medications prior to admission  Medication Dose Route Frequency Provider Last Rate Last Dose  . methylPREDNISolone acetate (DEPO-MEDROL) injection 40 mg  40 mg Intra-articular Once Vickki Hearing, MD      . methylPREDNISolone acetate (DEPO-MEDROL) injection 40 mg  40 mg Intra-articular Once Vickki Hearing, MD  Prescriptions prior to admission  Medication Sig Dispense Refill  . aspirin 81 MG tablet Take 162 mg by mouth daily.       . beta carotene w/minerals (OCUVITE) tablet Take 1 tablet by mouth 2 (two) times daily.       . carvedilol (COREG) 25 MG tablet Take 25 mg by mouth 2 (two) times daily with a meal.        . Coenzyme Q10 (CO Q 10) 100 MG CAPS Take 100 mg by mouth 2 (two) times daily.       . enalapril (VASOTEC) 10 MG tablet Take 1 tablet (10 mg total) by mouth daily.  30 tablet  11  . folic acid (FOLVITE) 1 MG tablet Take 1 tablet (1 mg total) by mouth daily.  90 tablet  3  . furosemide (LASIX) 40 MG tablet Take 80 mg by mouth daily.      Marland Kitchen gabapentin (NEURONTIN) 100 MG capsule Take 100 mg by mouth 3 (three) times daily.      Marland Kitchen glipiZIDE (GLUCOTROL) 10 MG tablet Take 2.5 mg by mouth daily as needed (for high blood sugar).      Marland Kitchen HYDROcodone-acetaminophen (NORCO) 7.5-325 MG per tablet Take 1 tablet by mouth every 6 (six) hours as needed for moderate pain.       . IRON PO Take 365 mg by mouth daily.       . isosorbide mononitrate (IMDUR) 30 MG 24 hr tablet Take 1 tablet by mouth daily.      Marland Kitchen levothyroxine (SYNTHROID, LEVOTHROID) 112 MCG tablet Take 112 mcg by mouth daily before breakfast.      . metFORMIN (GLUCOPHAGE) 500 MG tablet Take 500 mg by mouth 4 (four) times daily.      . niacin (NIASPAN) 500 MG CR tablet Take 1 tablet (500 mg total) by mouth 2 (two) times daily.  90 tablet  3  . pravastatin  (PRAVACHOL) 80 MG tablet Take 1 tablet (80 mg total) by mouth daily.  90 tablet  3  . traMADol (ULTRAM) 50 MG tablet Take 100 mg by mouth 3 (three) times daily. Takes 2 tablets 4 times daily.      Marland Kitchen ZETIA 10 MG tablet Take 1 tablet by mouth daily.       Scheduled: . antiseptic oral rinse  7 mL Mouth Rinse QID  . chlorhexidine  15 mL Mouth Rinse BID  . enalaprilat  1.25 mg Intravenous 4 times per day  . [START ON 02/09/2014] enoxaparin (LOVENOX) injection  40 mg Subcutaneous Q24H  . insulin aspart  0-20 Units Subcutaneous TID WC  . levothyroxine  100 mcg Intravenous Daily   Continuous: . lactated ringers 75 mL/hr at 02/08/14 1700   ZOX:[id number], fentaNYL, ondansetron (ZOFRAN) IV, ondansetron  Assesment: She was admitted with cholecystitis and underwent cholecystectomy. She has obesity hypoventilation with sleep apnea and she was not able to remain extubated after she will have finished her surgery. She looks 3 comfortable now. She does not have any known lung disease. Active Problems:   Chronic cholecystitis    Plan: She will be on the ventilator overnight and recheck in the morning. I think she'll probably be a with come off tomorrow but apparently she was a somewhat difficult intubation so will make sure she is okay before we extubate.    Thanks for allowing me to see her with you    LOS: 0 days   Ferlando Lia L 02/08/2014, 6:00 PM

## 2014-02-08 NOTE — Progress Notes (Signed)
Patient had to be reintubated due to respiratory depression. The family has been notified. Will place patient in ICU for 24-hour monitoring while on ventilator. Dr. Juanetta Gosling has been consulted. Anticipate extubation in the a.m. with anesthesia standby.

## 2014-02-08 NOTE — Anesthesia Postprocedure Evaluation (Signed)
  Anesthesia Post-op Note  Patient: Samantha [name]  Procedure(s) Performed: Procedure(s): LAPAROSCOPIC CHOLECYSTECTOMY (N/A)  Patient Location: ICU  Anesthesia Type:General  Level of Consciousness: awake, alert  and patient cooperative  Airway and Oxygen Therapy: Patient remains intubated per anesthesia plan  Post-op Pain: mild  Post-op Assessment: Post-op Vital signs reviewed, Patient's Cardiovascular Status Stable, Respiratory Function Stable, No signs of Nausea or vomiting and Pain level controlled  Post-op Vital Signs: Reviewed and stable  Last Vitals:  Filed Vitals:   02/08/14 1130  BP: 113/64  Pulse: 90  Temp:   Resp: 14    Complications: Patient re-intubated in PACU

## 2014-02-08 NOTE — Op Note (Signed)
Patient:  Samantha [name]  DOB:  April 13, 1946  MRN:  [id number]   Preop Diagnosis:  Chronic cholecystitis  Postop Diagnosis:  Same  Procedure:  Laparoscopic cholecystectomy  Surgeon:  Franky Macho, M.D.  Anes:  General endotracheal  Indications:  Patient is a 68 year old morbidly obese white female multiple medical problems who presents with biliary colic secondary to chronic cholecystitis. The risks and benefits of the procedure including bleeding, infection, hepatobiliary injury, the possibility of an open procedure were fully explained to the patient, who gave informed consent.  Procedure note:  The patient was placed the supine position. After induction of general endotracheal anesthesia, the abdomen was prepped and draped using usual sterile technique with DuraPrep. Surgical site confirmation was performed.  A supraumbilical incision was made down the fascia. A Veress needle was introduced into the abdominal cavity and confirmation of placement was done using the saline drop test. The abdomen was then insufflated to 16 mm mercury pressure. An 11 mm trocar was introduced into the abdominal cavity under direct visualization without difficulty. The patient was placed in reverse Trendelenburg position and additional 11 mm trocar was placed the epigastric region and 5 mm trochars were placed the right upper quadrant and right flank regions. The liver was inspected and noted to be within normal limits. The gallbladder was retracted in a dynamic fashion in order to expose the triangle of Calot. The cystic duct was first identified. Its junction to the infundibulum was fully identified. Endoclips placed proximally distally on the cystic duct, and the cystic duct was divided. This was likewise done the cystic artery. The gallbladder was then freed away from the gallbladder fossa using Bovie electrocautery. The gallbladder was delivered through the epigastric trocar site using an Endo Catch bag. The  gallbladder fossa was inspected and no abnormal bleeding or bile leakage was noted. Surgicel is placed the gallbladder fossa. All fluid and air were then evacuated from the abdominal cavity prior to removal of the trochars.  All wounds were irrigated with normal saline. All wounds were injected with 0.5% Sensorcaine. The supraumbilical fascia was reapproximated using an 0 Vicryl interrupted suture. All skin incisions were closed using staples. Betadine ointment and dry sterile dressings were applied.  All tape and needle counts were correct the end of the procedure. Patient was extubated in the operating room and transferred to PACU in stable condition.  Complications:  None  EBL:  Minimal  Specimen:  Gallbladder

## 2014-02-08 NOTE — Transfer of Care (Signed)
Immediate Anesthesia Transfer of Care Note  Patient: Samantha [name]  Procedure(s) Performed: Procedure(s): LAPAROSCOPIC CHOLECYSTECTOMY (N/A)  Patient Location: PACU  Anesthesia Type:General  Level of Consciousness: awake, alert , patient cooperative and responds to stimulation  Airway & Oxygen Therapy: Patient Spontanous Breathing, Patient connected to face mask oxygen and non-rebreather face mask  Post-op Assessment: Report given to PACU RN, Post -op Vital signs reviewed and unstable, Anesthesiologist notified and Patient moving all extremities  Post vital signs: Reviewed and stable  Complications: Patient re-intubated and respiratory complications

## 2014-02-09 ENCOUNTER — Observation Stay (HOSPITAL_COMMUNITY): Payer: Medicare HMO

## 2014-02-09 ENCOUNTER — Encounter (HOSPITAL_COMMUNITY): Payer: Self-pay | Admitting: General Surgery

## 2014-02-09 DIAGNOSIS — H3530 Unspecified macular degeneration: Secondary | ICD-10-CM | POA: Diagnosis present

## 2014-02-09 DIAGNOSIS — J45909 Unspecified asthma, uncomplicated: Secondary | ICD-10-CM | POA: Diagnosis present

## 2014-02-09 DIAGNOSIS — E119 Type 2 diabetes mellitus without complications: Secondary | ICD-10-CM | POA: Diagnosis present

## 2014-02-09 DIAGNOSIS — I509 Heart failure, unspecified: Secondary | ICD-10-CM | POA: Diagnosis present

## 2014-02-09 DIAGNOSIS — E662 Morbid (severe) obesity with alveolar hypoventilation: Secondary | ICD-10-CM | POA: Diagnosis present

## 2014-02-09 DIAGNOSIS — Z87891 Personal history of nicotine dependence: Secondary | ICD-10-CM | POA: Diagnosis not present

## 2014-02-09 DIAGNOSIS — Z8249 Family history of ischemic heart disease and other diseases of the circulatory system: Secondary | ICD-10-CM | POA: Diagnosis not present

## 2014-02-09 DIAGNOSIS — F411 Generalized anxiety disorder: Secondary | ICD-10-CM | POA: Diagnosis present

## 2014-02-09 DIAGNOSIS — G4733 Obstructive sleep apnea (adult) (pediatric): Secondary | ICD-10-CM | POA: Diagnosis present

## 2014-02-09 DIAGNOSIS — E785 Hyperlipidemia, unspecified: Secondary | ICD-10-CM | POA: Diagnosis present

## 2014-02-09 DIAGNOSIS — J9620 Acute and chronic respiratory failure, unspecified whether with hypoxia or hypercapnia: Secondary | ICD-10-CM | POA: Diagnosis not present

## 2014-02-09 DIAGNOSIS — E039 Hypothyroidism, unspecified: Secondary | ICD-10-CM | POA: Diagnosis present

## 2014-02-09 DIAGNOSIS — Z833 Family history of diabetes mellitus: Secondary | ICD-10-CM | POA: Diagnosis not present

## 2014-02-09 DIAGNOSIS — Z6841 Body Mass Index (BMI) 40.0 and over, adult: Secondary | ICD-10-CM | POA: Diagnosis not present

## 2014-02-09 DIAGNOSIS — I10 Essential (primary) hypertension: Secondary | ICD-10-CM | POA: Diagnosis present

## 2014-02-09 DIAGNOSIS — I498 Other specified cardiac arrhythmias: Secondary | ICD-10-CM | POA: Diagnosis not present

## 2014-02-09 DIAGNOSIS — M129 Arthropathy, unspecified: Secondary | ICD-10-CM | POA: Diagnosis present

## 2014-02-09 DIAGNOSIS — R109 Unspecified abdominal pain: Secondary | ICD-10-CM | POA: Diagnosis present

## 2014-02-09 DIAGNOSIS — K8010 Calculus of gallbladder with chronic cholecystitis without obstruction: Secondary | ICD-10-CM | POA: Diagnosis present

## 2014-02-09 LAB — GLUCOSE, CAPILLARY
GLUCOSE-CAPILLARY: 150 mg/dL — AB (ref 70–99)
GLUCOSE-CAPILLARY: 185 mg/dL — AB (ref 70–99)
GLUCOSE-CAPILLARY: 186 mg/dL — AB (ref 70–99)
Glucose-Capillary: 164 mg/dL — ABNORMAL HIGH (ref 70–99)

## 2014-02-09 LAB — BASIC METABOLIC PANEL
Anion gap: 18 — ABNORMAL HIGH (ref 5–15)
BUN: 15 mg/dL (ref 6–23)
CO2: 26 mEq/L (ref 19–32)
CREATININE: 1.17 mg/dL — AB (ref 0.50–1.10)
Calcium: 9.2 mg/dL (ref 8.4–10.5)
Chloride: 98 mEq/L (ref 96–112)
GFR calc Af Amer: 54 mL/min — ABNORMAL LOW (ref >90)
GFR calc non Af Amer: 47 mL/min — ABNORMAL LOW (ref >90)
GLUCOSE: 159 mg/dL — AB (ref 70–99)
Potassium: 3.7 mEq/L (ref 3.7–5.3)
Sodium: 142 mEq/L (ref 137–147)

## 2014-02-09 LAB — BLOOD GAS, ARTERIAL
ACID-BASE EXCESS: 2 mmol/L (ref 0.0–2.0)
Acid-Base Excess: 1.7 mmol/L (ref 0.0–2.0)
BICARBONATE: 24.2 meq/L — AB (ref 20.0–24.0)
BICARBONATE: 25.3 meq/L — AB (ref 20.0–24.0)
Drawn by: 21310
Drawn by: 234301
FIO2: 50 %
FIO2: 40 %
MECHVT: 600 mL
Mode: POSITIVE
O2 SAT: 97.3 %
O2 Saturation: 96.8 %
PATIENT TEMPERATURE: 37
PATIENT TEMPERATURE: 37
PEEP: 5 cmH2O
PEEP: 5 cmH2O
Pressure support: 5 cmH2O
RATE: 15 resp/min
TCO2: 20.3 mmol/L (ref 0–100)
TCO2: 22.4 mmol/L (ref 0–100)
pCO2 arterial: 28.4 mmHg — ABNORMAL LOW (ref 35.0–45.0)
pCO2 arterial: 34.8 mmHg — ABNORMAL LOW (ref 35.0–45.0)
pH, Arterial: 7.54 — ABNORMAL HIGH (ref 7.350–7.450)
pH, Arterial: 7.475 — ABNORMAL HIGH (ref 7.350–7.450)
pO2, Arterial: 87.2 mmHg (ref 80.0–100.0)
pO2, Arterial: 87.1 mmHg (ref 80.0–100.0)

## 2014-02-09 LAB — HEMOGLOBIN A1C
HEMOGLOBIN A1C: 6.8 % — AB (ref <5.7)
Mean Plasma Glucose: 148 mg/dL — ABNORMAL HIGH (ref <117)

## 2014-02-09 LAB — CBC
HEMATOCRIT: 35.2 % — AB (ref 36.0–46.0)
Hemoglobin: 11.5 g/dL — ABNORMAL LOW (ref 12.0–15.0)
MCH: 29 pg (ref 26.0–34.0)
MCHC: 32.7 g/dL (ref 30.0–36.0)
MCV: 88.7 fL (ref 78.0–100.0)
Platelets: 309 10*3/uL (ref 150–400)
RBC: 3.97 MIL/uL (ref 3.87–5.11)
RDW: 14.2 % (ref 11.5–15.5)
WBC: 15.2 10*3/uL — ABNORMAL HIGH (ref 4.0–10.5)

## 2014-02-09 MED ORDER — TRAMADOL HCL 50 MG PO TABS
100.0000 mg | ORAL_TABLET | Freq: Four times a day (QID) | ORAL | Status: DC | PRN
  Administered 2014-02-09 (×2): 100 mg via ORAL
  Filled 2014-02-09 (×3): qty 2

## 2014-02-09 MED ORDER — METFORMIN HCL 500 MG PO TABS
500.0000 mg | ORAL_TABLET | Freq: Three times a day (TID) | ORAL | Status: DC
  Administered 2014-02-09 – 2014-02-10 (×4): 500 mg via ORAL
  Filled 2014-02-09 (×4): qty 1

## 2014-02-09 MED ORDER — HYDROCODONE-ACETAMINOPHEN 7.5-325 MG PO TABS: 1.0000 | ORAL_TABLET | Freq: Four times a day (QID) | ORAL | Status: DC | PRN

## 2014-02-09 MED ORDER — ISOSORBIDE MONONITRATE ER 30 MG PO TB24
30.0000 mg | ORAL_TABLET | Freq: Every day | ORAL | Status: DC
  Administered 2014-02-09 – 2014-02-10 (×2): 30 mg via ORAL
  Filled 2014-02-09 (×2): qty 1

## 2014-02-09 MED ORDER — NIACIN ER (ANTIHYPERLIPIDEMIC) 500 MG PO TBCR
500.0000 mg | EXTENDED_RELEASE_TABLET | Freq: Two times a day (BID) | ORAL | Status: DC
  Administered 2014-02-09 – 2014-02-10 (×3): 500 mg via ORAL
  Filled 2014-02-09 (×9): qty 1

## 2014-02-09 MED ORDER — GLIPIZIDE 5 MG PO TABS: 2.5000 mg | ORAL_TABLET | Freq: Every day | ORAL | Status: DC | PRN

## 2014-02-09 MED ORDER — LEVOTHYROXINE SODIUM 112 MCG PO TABS
112.0000 ug | ORAL_TABLET | Freq: Every day | ORAL | Status: DC
  Administered 2014-02-10: 112 ug via ORAL
  Filled 2014-02-09: qty 1

## 2014-02-09 MED ORDER — CARVEDILOL 12.5 MG PO TABS
25.0000 mg | ORAL_TABLET | Freq: Two times a day (BID) | ORAL | Status: DC
  Administered 2014-02-09 – 2014-02-10 (×2): 25 mg via ORAL
  Filled 2014-02-09 (×2): qty 2

## 2014-02-09 MED ORDER — FUROSEMIDE 80 MG PO TABS
80.0000 mg | ORAL_TABLET | Freq: Every day | ORAL | Status: DC
  Administered 2014-02-09 – 2014-02-10 (×2): 80 mg via ORAL
  Filled 2014-02-09 (×2): qty 1

## 2014-02-09 MED ORDER — ENALAPRIL MALEATE 5 MG PO TABS
10.0000 mg | ORAL_TABLET | Freq: Every day | ORAL | Status: DC
  Administered 2014-02-09 – 2014-02-10 (×2): 10 mg via ORAL
  Filled 2014-02-09 (×2): qty 2

## 2014-02-09 MED ORDER — EZETIMIBE 10 MG PO TABS
10.0000 mg | ORAL_TABLET | Freq: Every day | ORAL | Status: DC
  Administered 2014-02-09 – 2014-02-10 (×2): 10 mg via ORAL
  Filled 2014-02-09 (×2): qty 1

## 2014-02-09 MED ORDER — GABAPENTIN 100 MG PO CAPS
100.0000 mg | ORAL_CAPSULE | Freq: Three times a day (TID) | ORAL | Status: DC
  Administered 2014-02-09 – 2014-02-10 (×4): 100 mg via ORAL
  Filled 2014-02-09 (×4): qty 1

## 2014-02-09 NOTE — Procedures (Signed)
Extubation Procedure Note  Patient Details:   Name: Samantha [name] DOB: 1945/11/25 MRN: [id number]   Airway Documentation:  ABG performed,results within mormal range. BBS coarse. Patient performed -60 NIF and 8L on FVC.  RT extubated per physician order. No complications noted. Patient placed on 4L nasal cannula for SATs 93%. RT will continue to monitor Evaluation  O2 sats: stable throughout Complications: No apparent complications Patient did tolerate procedure well. Bilateral Breath Sounds: Diminished;Rhonchi Suctioning: Oral;Airway Yes  Cloretta Ned 02/09/2014, 9:01 AM

## 2014-02-09 NOTE — Progress Notes (Signed)
1 Day Post-Op  Subjective: Extubated, tolerating it well.  Objective: Vital signs in last 24 hours: Temp:  [96.8 F (36 C)-98.7 F (37.1 C)] 96.8 F (36 C) (09/10 0743) Pulse Rate:  [65-122] 118 (09/10 0800) Resp:  [14-27] 20 (09/10 0800) BP: (107-136)/(59-93) 126/81 mmHg (09/10 0800) SpO2:  [93 %-100 %] 100 % (09/10 0816) FiO2 (%):  [40 %-100 %] 40 % (09/10 0816) Weight:  [132.45 kg (292 lb)] 132.45 kg (292 lb) (09/09 1540)    Intake/Output from previous day: 09/09 0701 - 09/10 0700 In: 1575 [I.V.:1575] Out: 250 [Urine:250] Intake/Output this shift: Total I/O In: 75 [I.V.:75] Out: -   General appearance: alert, cooperative and no distress Resp: clear to auscultation bilaterally Cardio: Sinus tachycardia post extubation GI: Soft, dressings dry and intact.  Lab Results:   Recent Labs  02/08/14 1300 02/09/14 0502  WBC 11.8* 15.2*  HGB 11.7* 11.5*  HCT 36.1 35.2*  PLT 273 309   BMET  Recent Labs  02/08/14 1300 02/09/14 0502  NA 141 142  K 3.9 3.7  CL 99 98  CO2 27 26  GLUCOSE 209* 159*  BUN 13 15  CREATININE 1.05 1.17*  CALCIUM 9.1 9.2   PT/INR No results found for this basename: LABPROT, INR,  in the last 72 hours  Studies/Results: Portable Chest Xray In Am  02/09/2014   CLINICAL DATA:  Postop hypoventilation  EXAM: PORTABLE CHEST - 1 VIEW  COMPARISON:  02/08/2014  FINDINGS: There is an endotracheal tube with the tip 6 cm above the carina. There is elevation of the left diaphragm. There is left basilar airspace disease which may reflect atelectasis versus pneumonia, but unchanged compared with the prior exam. There is mild bilateral interstitial thickening. There is no pleural effusion or pneumothorax. The heart and mediastinal contours are unremarkable.  The osseous structures are unremarkable.  IMPRESSION: 1. Endotracheal tube with the tip 6 cm above the carina. 2. Bilateral interstitial thickening which in part may be related to relative hypoventilation  versus mild interstitial edema. 3. Left basilar airspace disease which may reflect atelectasis versus pneumonia.   Electronically Signed   By: Elige Ko   On: 02/09/2014 08:00   Dg Chest Port 1 View  02/08/2014   CLINICAL DATA:  68 year old female status post intubation. Initial encounter. Chronic cholecystitis.  EXAM: PORTABLE CHEST - 1 VIEW  COMPARISON:  Chest radiographs 10/17/2013.  FINDINGS: Portable AP semi upright view at 1046 hrs. Endotracheal tube tip in good position about midway between the clavicles and carina. Lower lung volumes. Partial obscuration of the left hemidiaphragm. Stable cardiac size and mediastinal contours. No pneumothorax. Small left pleural effusion is possible. Pulmonary vascular congestion without overt edema.  IMPRESSION: 1. Endotracheal tube in good position, tip between the clavicles and carina. 2. Vascular congestion, possible small left pleural effusion, and atelectasis versus consolidation at the left lung base.   Electronically Signed   By: Augusto Gamble M.D.   On: 02/08/2014 11:05    Anti-infectives: Anti-infectives   Start     Dose/Rate Route Frequency Ordered Stop   02/08/14 0703  vancomycin (VANCOCIN) 1,500 mg in sodium chloride 0.9 % 500 mL IVPB     1,500 mg 250 mL/hr over 120 Minutes Intravenous On call to O.R. 02/08/14 0703 02/08/14 0843      Assessment/Plan: s/p Procedure(s): LAPAROSCOPIC CHOLECYSTECTOMY Impression: Stable after extubation. Will continue to monitor in ICU for 24 hours. Will advance diet as tolerated.  LOS: 1 day    Otho Michalik A  02/09/2014  

## 2014-02-09 NOTE — Progress Notes (Signed)
Subjective: She did okay overnight. She did have some anxiety. She has no other new complaints.  Objective: Vital signs in last 24 hours: Temp:  [96.8 F (36 C)-98.7 F (37.1 C)] 96.8 F (36 C) (09/10 0743) Pulse Rate:  [65-122] 118 (09/10 0800) Resp:  [14-27] 20 (09/10 0800) BP: (107-136)/(59-93) 126/81 mmHg (09/10 0800) SpO2:  [93 %-100 %] 100 % (09/10 0816) FiO2 (%):  [40 %-100 %] 40 % (09/10 0816) Weight:  [132.45 kg (292 lb)] 132.45 kg (292 lb) (09/09 1540) Weight change:     Intake/Output from previous day: 09/09 0701 - 09/10 0700 In: 1575 [I.V.:1575] Out: 250 [Urine:250]  PHYSICAL EXAM General appearance: alert, cooperative and mild distress Resp: clear to auscultation bilaterally Cardio: regular rate and rhythm, S1, S2 normal, no murmur, click, rub or gallop GI: soft, non-tender; bowel sounds normal; no masses,  no organomegaly Extremities: extremities normal, atraumatic, no cyanosis or edema  Lab Results:  Results for orders placed during the hospital encounter of 02/08/14 (from the past 48 hour(s))  GLUCOSE, CAPILLARY     Status: Abnormal   Collection Time    02/08/14  7:10 AM      Result Value Ref Range   Glucose-Capillary 161 (*) 70 - 99 mg/dL  GLUCOSE, CAPILLARY     Status: Abnormal   Collection Time    02/08/14 10:56 AM      Result Value Ref Range   Glucose-Capillary 243 (*) 70 - 99 mg/dL  MRSA PCR SCREENING     Status: None   Collection Time    02/08/14 12:10 PM      Result Value Ref Range   MRSA by PCR NEGATIVE  NEGATIVE   Comment:            The GeneXpert MRSA Assay (FDA     approved for NASAL specimens     only), is one component of a     comprehensive MRSA colonization     surveillance program. It is not     intended to diagnose MRSA     infection nor to guide or     monitor treatment for     MRSA infections.  BLOOD GAS, ARTERIAL     Status: Abnormal   Collection Time    02/08/14 12:20 PM      Result Value Ref Range   FIO2 100.00      Delivery systems VENTILATOR     Mode PRESSURE REGULATED VOLUME CONTROL     VT 600     Rate 15     Peep/cpap 5.0     pH, Arterial 7.458 (*) 7.350 - 7.450   pCO2 arterial 37.8  35.0 - 45.0 mmHg   pO2, Arterial 112.0 (*) 80.0 - 100.0 mmHg   Bicarbonate 26.4 (*) 20.0 - 24.0 mEq/L   TCO2 23.4  0 - 100 mmol/L   Acid-Base Excess 2.7 (*) 0.0 - 2.0 mmol/L   O2 Saturation 98.3     Patient temperature 37.0     Collection site LEFT BRACHIAL     Drawn by [id number]     Sample type ARTERIAL     Allens test (pass/fail) PASS  PASS  CBC WITH DIFFERENTIAL     Status: Abnormal   Collection Time    02/08/14  1:00 PM      Result Value Ref Range   WBC 11.8 (*) 4.0 - 10.5 K/uL   RBC 4.01  3.87 - 5.11 MIL/uL   Hemoglobin 11.7 (*) 12.0 - 15.0 g/dL  HCT 36.1  36.0 - 46.0 %   MCV 90.0  78.0 - 100.0 fL   MCH 29.2  26.0 - 34.0 pg   MCHC 32.4  30.0 - 36.0 g/dL   RDW 14.1  11.5 - 15.5 %   Platelets 273  150 - 400 K/uL   Neutrophils Relative % 88 (*) 43 - 77 %   Neutro Abs 10.4 (*) 1.7 - 7.7 K/uL   Lymphocytes Relative 10 (*) 12 - 46 %   Lymphs Abs 1.2  0.7 - 4.0 K/uL   Monocytes Relative 2 (*) 3 - 12 %   Monocytes Absolute 0.2  0.1 - 1.0 K/uL   Eosinophils Relative 0  0 - 5 %   Eosinophils Absolute 0.0  0.0 - 0.7 K/uL   Basophils Relative 0  0 - 1 %   Basophils Absolute 0.0  0.0 - 0.1 K/uL  BASIC METABOLIC PANEL     Status: Abnormal   Collection Time    02/08/14  1:00 PM      Result Value Ref Range   Sodium 141  137 - 147 mEq/L   Potassium 3.9  3.7 - 5.3 mEq/L   Chloride 99  96 - 112 mEq/L   CO2 27  19 - 32 mEq/L   Glucose, Bld 209 (*) 70 - 99 mg/dL   BUN 13  6 - 23 mg/dL   Creatinine, Ser 1.05  0.50 - 1.10 mg/dL   Calcium 9.1  8.4 - 10.5 mg/dL   GFR calc non Af Amer 53 (*) >90 mL/min   GFR calc Af Amer 62 (*) >90 mL/min   Comment: (NOTE)     The eGFR has been calculated using the CKD EPI equation.     This calculation has not been validated in all clinical situations.     eGFR's persistently  <90 mL/min signify possible Chronic Kidney     Disease.   Anion gap 15  5 - 15  HEPATIC FUNCTION PANEL     Status: Abnormal   Collection Time    02/08/14  1:00 PM      Result Value Ref Range   Total Protein 6.7  6.0 - 8.3 g/dL   Albumin 3.1 (*) 3.5 - 5.2 g/dL   AST 25  0 - 37 U/L   ALT 17  0 - 35 U/L   Alkaline Phosphatase 64  39 - 117 U/L   Total Bilirubin 0.6  0.3 - 1.2 mg/dL   Bilirubin, Direct <0.2  0.0 - 0.3 mg/dL   Indirect Bilirubin NOT CALCULATED  0.3 - 0.9 mg/dL  GLUCOSE, CAPILLARY     Status: Abnormal   Collection Time    02/08/14  4:55 PM      Result Value Ref Range   Glucose-Capillary 175 (*) 70 - 99 mg/dL   Comment 1 Notify RN    GLUCOSE, CAPILLARY     Status: Abnormal   Collection Time    02/08/14  9:25 PM      Result Value Ref Range   Glucose-Capillary 165 (*) 70 - 99 mg/dL   Comment 1 Notify RN    BASIC METABOLIC PANEL     Status: Abnormal   Collection Time    02/09/14  5:02 AM      Result Value Ref Range   Sodium 142  137 - 147 mEq/L   Potassium 3.7  3.7 - 5.3 mEq/L   Chloride 98  96 - 112 mEq/L   CO2 26  19 -  32 mEq/L   Glucose, Bld 159 (*) 70 - 99 mg/dL   BUN 15  6 - 23 mg/dL   Creatinine, Ser 1.17 (*) 0.50 - 1.10 mg/dL   Calcium 9.2  8.4 - 10.5 mg/dL   GFR calc non Af Amer 47 (*) >90 mL/min   GFR calc Af Amer 54 (*) >90 mL/min   Comment: (NOTE)     The eGFR has been calculated using the CKD EPI equation.     This calculation has not been validated in all clinical situations.     eGFR's persistently <90 mL/min signify possible Chronic Kidney     Disease.   Anion gap 18 (*) 5 - 15  CBC     Status: Abnormal   Collection Time    02/09/14  5:02 AM      Result Value Ref Range   WBC 15.2 (*) 4.0 - 10.5 K/uL   RBC 3.97  3.87 - 5.11 MIL/uL   Hemoglobin 11.5 (*) 12.0 - 15.0 g/dL   HCT 35.2 (*) 36.0 - 46.0 %   MCV 88.7  78.0 - 100.0 fL   MCH 29.0  26.0 - 34.0 pg   MCHC 32.7  30.0 - 36.0 g/dL   RDW 14.2  11.5 - 15.5 %   Platelets 309  150 - 400 K/uL   BLOOD GAS, ARTERIAL     Status: Abnormal   Collection Time    02/09/14  5:55 AM      Result Value Ref Range   FIO2 50.00     Delivery systems VENTILATOR     Mode PRESSURE REGULATED VOLUME CONTROL     VT 600     Rate 15.0     Peep/cpap 5.0     pH, Arterial 7.540 (*) 7.350 - 7.450   pCO2 arterial 28.4 (*) 35.0 - 45.0 mmHg   pO2, Arterial 87.2  80.0 - 100.0 mmHg   Bicarbonate 24.2 (*) 20.0 - 24.0 mEq/L   TCO2 20.3  0 - 100 mmol/L   Acid-Base Excess 1.7  0.0 - 2.0 mmol/L   O2 Saturation 97.3     Patient temperature 37.0     Collection site RIGHT BRACHIAL     Drawn by [id number]     Sample type ARTERIAL     Allens test (pass/fail) NOT INDICATED (*) PASS  GLUCOSE, CAPILLARY     Status: Abnormal   Collection Time    02/09/14  7:05 AM      Result Value Ref Range   Glucose-Capillary 150 (*) 70 - 99 mg/dL   Comment 1 Documented in Chart     Comment 2 Notify RN    BLOOD GAS, ARTERIAL     Status: Abnormal   Collection Time    02/09/14  8:25 AM      Result Value Ref Range   FIO2 40.00     Delivery systems VENTILATOR     Mode CONTINUOUS POSITIVE AIRWAY PRESSURE     Peep/cpap 5.0     Pressure support 5     pH, Arterial 7.475 (*) 7.350 - 7.450   pCO2 arterial 34.8 (*) 35.0 - 45.0 mmHg   pO2, Arterial 87.1  80.0 - 100.0 mmHg   Bicarbonate 25.3 (*) 20.0 - 24.0 mEq/L   TCO2 22.4  0 - 100 mmol/L   Acid-Base Excess 2.0  0.0 - 2.0 mmol/L   O2 Saturation 96.8     Patient temperature 37.0     Collection site LEFT BRACHIAL  Drawn by [phone]     Sample type ARTERIAL     Allens test (pass/fail) PASS  PASS    ABGS  Recent Labs  02/09/14 0825  PHART 7.475*  PO2ART 87.1  TCO2 22.4  HCO3 25.3*   CULTURES Recent Results (from the past 240 hour(s))  MRSA PCR SCREENING     Status: None   Collection Time    02/08/14 12:10 PM      Result Value Ref Range Status   MRSA by PCR NEGATIVE  NEGATIVE Final   Comment:            The GeneXpert MRSA Assay (FDA     approved for NASAL specimens      only), is one component of a     comprehensive MRSA colonization     surveillance program. It is not     intended to diagnose MRSA     infection nor to guide or     monitor treatment for     MRSA infections.   Studies/Results: Portable Chest Xray In Am  02/09/2014   CLINICAL DATA:  Postop hypoventilation  EXAM: PORTABLE CHEST - 1 VIEW  COMPARISON:  02/08/2014  FINDINGS: There is an endotracheal tube with the tip 6 cm above the carina. There is elevation of the left diaphragm. There is left basilar airspace disease which may reflect atelectasis versus pneumonia, but unchanged compared with the prior exam. There is mild bilateral interstitial thickening. There is no pleural effusion or pneumothorax. The heart and mediastinal contours are unremarkable.  The osseous structures are unremarkable.  IMPRESSION: 1. Endotracheal tube with the tip 6 cm above the carina. 2. Bilateral interstitial thickening which in part may be related to relative hypoventilation versus mild interstitial edema. 3. Left basilar airspace disease which may reflect atelectasis versus pneumonia.   Electronically Signed   By: Kathreen Devoid   On: 02/09/2014 08:00   Dg Chest Port 1 View  02/08/2014   CLINICAL DATA:  68 year old female status post intubation. Initial encounter. Chronic cholecystitis.  EXAM: PORTABLE CHEST - 1 VIEW  COMPARISON:  Chest radiographs 10/17/2013.  FINDINGS: Portable AP semi upright view at 1046 hrs. Endotracheal tube tip in good position about midway between the clavicles and carina. Lower lung volumes. Partial obscuration of the left hemidiaphragm. Stable cardiac size and mediastinal contours. No pneumothorax. Small left pleural effusion is possible. Pulmonary vascular congestion without overt edema.  IMPRESSION: 1. Endotracheal tube in good position, tip between the clavicles and carina. 2. Vascular congestion, possible small left pleural effusion, and atelectasis versus consolidation at the left lung base.    Electronically Signed   By: Lars Pinks M.D.   On: 02/08/2014 11:05    Medications:  Prior to Admission:  Facility-administered medications prior to admission  Medication Dose Route Frequency Provider Last Rate Last Dose  . methylPREDNISolone acetate (DEPO-MEDROL) injection 40 mg  40 mg Intra-articular Once Carole Civil, MD      . methylPREDNISolone acetate (DEPO-MEDROL) injection 40 mg  40 mg Intra-articular Once Carole Civil, MD       Prescriptions prior to admission  Medication Sig Dispense Refill  . aspirin 81 MG tablet Take 162 mg by mouth daily.       . beta carotene w/minerals (OCUVITE) tablet Take 1 tablet by mouth 2 (two) times daily.       . carvedilol (COREG) 25 MG tablet Take 25 mg by mouth 2 (two) times daily with a meal.        .  Coenzyme Q10 (CO Q 10) 100 MG CAPS Take 100 mg by mouth 2 (two) times daily.       . enalapril (VASOTEC) 10 MG tablet Take 1 tablet (10 mg total) by mouth daily.  30 tablet  11  . folic acid (FOLVITE) 1 MG tablet Take 1 tablet (1 mg total) by mouth daily.  90 tablet  3  . furosemide (LASIX) 40 MG tablet Take 80 mg by mouth daily.      Marland Kitchen gabapentin (NEURONTIN) 100 MG capsule Take 100 mg by mouth 3 (three) times daily.      Marland Kitchen glipiZIDE (GLUCOTROL) 10 MG tablet Take 2.5 mg by mouth daily as needed (for high blood sugar).      Marland Kitchen HYDROcodone-acetaminophen (NORCO) 7.5-325 MG per tablet Take 1 tablet by mouth every 6 (six) hours as needed for moderate pain.       . IRON PO Take 365 mg by mouth daily.       . isosorbide mononitrate (IMDUR) 30 MG 24 hr tablet Take 1 tablet by mouth daily.      Marland Kitchen levothyroxine (SYNTHROID, LEVOTHROID) 112 MCG tablet Take 112 mcg by mouth daily before breakfast.      . metFORMIN (GLUCOPHAGE) 500 MG tablet Take 500 mg by mouth 4 (four) times daily.      . niacin (NIASPAN) 500 MG CR tablet Take 1 tablet (500 mg total) by mouth 2 (two) times daily.  90 tablet  3  . pravastatin (PRAVACHOL) 80 MG tablet Take 1 tablet (80  mg total) by mouth daily.  90 tablet  3  . traMADol (ULTRAM) 50 MG tablet Take 100 mg by mouth 3 (three) times daily. Takes 2 tablets 4 times daily.      Marland Kitchen ZETIA 10 MG tablet Take 1 tablet by mouth daily.       Scheduled: . antiseptic oral rinse  7 mL Mouth Rinse QID  . carvedilol  25 mg Oral BID WC  . chlorhexidine  15 mL Mouth Rinse BID  . enalapril  10 mg Oral Daily  . enoxaparin (LOVENOX) injection  40 mg Subcutaneous Q24H  . ezetimibe  10 mg Oral Daily  . furosemide  80 mg Oral Daily  . gabapentin  100 mg Oral TID  . insulin aspart  0-20 Units Subcutaneous TID WC  . isosorbide mononitrate  30 mg Oral Daily  . [START ON 02/10/2014] levothyroxine  112 mcg Oral QAC breakfast  . metFORMIN  500 mg Oral TID AC & HS  . niacin  500 mg Oral BID   Continuous: . lactated ringers 75 mL/hr at 02/09/14 0800   YQI:[id number], fentaNYL, glipiZIDE, HYDROcodone-acetaminophen, midazolam, ondansetron (ZOFRAN) IV, ondansetron, traMADol  Assesment: She was admitted with chronic cholecystitis. She had surgery and was extubated and had to be reintubated. This morning she looks good and wants to have the tube out. She's motioning for it to be removed. She does have some element of obesity hypoventilation at baseline. Active Problems:   Chronic cholecystitis    Plan: I think she's ready for extubation    LOS: 1 day   Kinsleigh Ludolph L 02/09/2014, 9:04 AM

## 2014-02-09 NOTE — Progress Notes (Signed)
Utilization review completed.  

## 2014-02-09 NOTE — Addendum Note (Signed)
Addendum created 02/09/14 1251 by Despina Hidden, CRNA   Modules edited: Notes Section   Notes Section:  File: [id number]

## 2014-02-09 NOTE — Anesthesia Postprocedure Evaluation (Signed)
  Anesthesia Post-op Note  Patient: Samantha [name]  Procedure(s) Performed: Procedure(s): LAPAROSCOPIC CHOLECYSTECTOMY (N/A)  Patient Location: room ICU 4  Anesthesia Type:General  Level of Consciousness: awake, alert , oriented and patient cooperative  Airway and Oxygen Therapy: Patient Spontanous Breathing and Patient connected to nasal cannula oxygen  Post-op Pain: 2 /10, mild  Post-op Assessment: Post-op Vital signs reviewed, Patient's Cardiovascular Status Stable, Respiratory Function Stable, Patent Airway, No signs of Nausea or vomiting and Pain level controlled  Post-op Vital Signs: Reviewed and stable  Last Vitals:  Filed Vitals:   02/09/14 1131  BP:   Pulse:   Temp: 36.7 C  Resp:     Complications: No apparent anesthesia complications

## 2014-02-10 LAB — GLUCOSE, CAPILLARY: Glucose-Capillary: 156 mg/dL — ABNORMAL HIGH (ref 70–99)

## 2014-02-10 NOTE — Progress Notes (Addendum)
Foley catheter removed. Pt tolerated removal well. Pt DTV by 1530 this afternoon. Will continue to monitor.   1040: Pt voided on her own.

## 2014-02-10 NOTE — Discharge Summary (Signed)
Physician Discharge Summary  Patient ID: Samantha [name] MRN: [id number] DOB/AGE: March 03, 1946 68 y.o.  Admit date: 02/08/2014 Discharge date: 02/10/2014  Admission Diagnoses: Chronic cholecystitis, sleep apnea, hypertension, hypothyroidism, non-insulin-dependent diabetes mellitus  Discharge Diagnoses: Same, respiratory depression Active Problems:   Chronic cholecystitis   Discharged Condition: good  Hospital Course: Patient is a 68 year old white female multiple medical problems who presented to [facility] for elective laparoscopic cholecystectomy due to chronic cholecystitis. Her surgical course was unremarkable. In the PACU, she developed respiratory depression and had to be reintubated. It was felt be secondary to a delay in metabolism of the anesthetic agents. It was elected to place her in the intensive care unit and give her 24 hours to wean off the later. Her vital signs remained stable. She was extubated on 02/09/2014 without difficulty. Her diet was advanced at difficulty. Her oxygen saturations on room air are borderline between 88 and 92%. She denies being short of breath.  Consults: pulmonary/intensive care  Treatments: surgery: Laparoscopic cholecystectomy on 02/08/2014  Discharge Exam: Blood pressure 103/63, pulse 111, temperature 98.3 F (36.8 C), temperature source Oral, resp. rate 20, height  (1.803 m), weight 132.45 kg (292 lb), SpO2 87.00%. General appearance: alert, cooperative, appears stated age and no distress Resp: clear to auscultation bilaterally Cardio: regular rate and rhythm, S1, S2 normal, no murmur, click, rub or gallop GI: Soft, dressings dry and intact.  Disposition: 01-Home or Self Care     Medication List         aspirin 81 MG tablet  Take 162 mg by mouth daily.     beta carotene w/minerals tablet  Take 1 tablet by mouth 2 (two) times daily.     carvedilol 25 MG tablet  Commonly known as:  COREG  Take 25 mg by mouth 2 (two)  times daily with a meal.     Co Q 10 100 MG Caps  Take 100 mg by mouth 2 (two) times daily.     enalapril 10 MG tablet  Commonly known as:  VASOTEC  Take 1 tablet (10 mg total) by mouth daily.     folic acid 1 MG tablet  Commonly known as:  FOLVITE  Take 1 tablet (1 mg total) by mouth daily.     furosemide 40 MG tablet  Commonly known as:  LASIX  Take 80 mg by mouth daily.     gabapentin 100 MG capsule  Commonly known as:  NEURONTIN  Take 100 mg by mouth 3 (three) times daily.     glipiZIDE 10 MG tablet  Commonly known as:  GLUCOTROL  Take 2.5 mg by mouth daily as needed (for high blood sugar).     HYDROcodone-acetaminophen 7.5-325 MG per tablet  Commonly known as:  NORCO  Take 1 tablet by mouth every 6 (six) hours as needed for moderate pain.     IRON PO  Take 365 mg by mouth daily.     isosorbide mononitrate 30 MG 24 hr tablet  Commonly known as:  IMDUR  Take 1 tablet by mouth daily.     levothyroxine 112 MCG tablet  Commonly known as:  SYNTHROID, LEVOTHROID  Take 112 mcg by mouth daily before breakfast.     metFORMIN 500 MG tablet  Commonly known as:  GLUCOPHAGE  Take 500 mg by mouth 4 (four) times daily.     niacin 500 MG CR tablet  Commonly known as:  NIASPAN  Take 1 tablet (500 mg total) by mouth 2 (  two) times daily.     pravastatin 80 MG tablet  Commonly known as:  PRAVACHOL  Take 1 tablet (80 mg total) by mouth daily.     traMADol 50 MG tablet  Commonly known as:  ULTRAM  Take 100 mg by mouth 3 (three) times daily. Takes 2 tablets 4 times daily.     ZETIA 10 MG tablet  Generic drug:  ezetimibe  Take 1 tablet by mouth daily.           Follow-up Information   Follow up with Dalia Heading, MD. Schedule an appointment as soon as possible for a visit on 02/16/2014.   Specialty:  General Surgery   Contact information:   [address] [place] [address] [phone]       Signed: Franky Macho A 02/10/2014, 8:48 AM

## 2014-02-10 NOTE — Progress Notes (Signed)
Did walking oxygen saturations with pt.   At rest on RA pt is sating 90%  Upon standing on RN pt sats 92%  Walking on RA pt maintained saturations between 90-91%  When returned to rest, pt was able to maintain her saturations at 93% on RA  Will continue to monitor.

## 2014-02-10 NOTE — Discharge Instructions (Signed)

## 2014-02-10 NOTE — Progress Notes (Signed)
She says she feels well and has no complaints. She was able to be extubated and has done well with no problems through the night. I will sign off at this point. Thanks for allowing me to see her with you

## 2014-02-10 NOTE — Plan of Care (Signed)
Problem: Phase II Progression Outcomes Goal: Return of bowel function (flatus, BM) IF ABDOMINAL SURGERY:  Outcome: Progressing Pt has not yet had a BM, but is passing flatus

## 2014-02-10 NOTE — Progress Notes (Signed)
Pt is to be discharged home today. Pt is in NAD, IV is out, all paperwork has been reviewed/discussed with patient, and there are no questions/concerns at this time. Assessment is unchanged from this morning. Pt is to be accompanied downstairs by staff and family via wheelchair.  

## 2014-02-15 ENCOUNTER — Other Ambulatory Visit: Payer: Self-pay

## 2014-02-15 MED ORDER — CARVEDILOL 25 MG PO TABS
25.0000 mg | ORAL_TABLET | Freq: Two times a day (BID) | ORAL | Status: AC
Start: 2014-02-15 — End: ?

## 2014-02-15 NOTE — Telephone Encounter (Signed)
Rx sent to pharmacy   

## 2014-03-08 ENCOUNTER — Other Ambulatory Visit: Payer: Self-pay | Admitting: Cardiovascular Disease

## 2014-03-08 NOTE — Telephone Encounter (Signed)
Rx was sent to pharmacy electronically. 

## 2014-03-15 ENCOUNTER — Other Ambulatory Visit (HOSPITAL_COMMUNITY): Payer: Self-pay | Admitting: Internal Medicine

## 2014-03-15 ENCOUNTER — Encounter (HOSPITAL_COMMUNITY): Payer: Self-pay | Admitting: Emergency Medicine

## 2014-03-15 ENCOUNTER — Inpatient Hospital Stay (HOSPITAL_COMMUNITY)
Admission: EM | Admit: 2014-03-15 | Discharge: 2014-03-18 | DRG: 683 | Disposition: A | Discharge status: Home Health | Payer: Medicare HMO | Attending: Family Medicine | Admitting: Family Medicine

## 2014-03-15 ENCOUNTER — Emergency Department (HOSPITAL_COMMUNITY): Payer: Medicare HMO

## 2014-03-15 DIAGNOSIS — H3530 Unspecified macular degeneration: Secondary | ICD-10-CM | POA: Diagnosis present

## 2014-03-15 DIAGNOSIS — R1010 Upper abdominal pain, unspecified: Secondary | ICD-10-CM

## 2014-03-15 DIAGNOSIS — M109 Gout, unspecified: Secondary | ICD-10-CM

## 2014-03-15 DIAGNOSIS — K529 Noninfective gastroenteritis and colitis, unspecified: Secondary | ICD-10-CM | POA: Diagnosis present

## 2014-03-15 DIAGNOSIS — R1084 Generalized abdominal pain: Secondary | ICD-10-CM

## 2014-03-15 DIAGNOSIS — L03116 Cellulitis of left lower limb: Secondary | ICD-10-CM | POA: Diagnosis present

## 2014-03-15 DIAGNOSIS — Z833 Family history of diabetes mellitus: Secondary | ICD-10-CM

## 2014-03-15 DIAGNOSIS — Z79899 Other long term (current) drug therapy: Secondary | ICD-10-CM

## 2014-03-15 DIAGNOSIS — M79674 Pain in right toe(s): Secondary | ICD-10-CM | POA: Diagnosis present

## 2014-03-15 DIAGNOSIS — I999 Unspecified disorder of circulatory system: Secondary | ICD-10-CM

## 2014-03-15 DIAGNOSIS — G4733 Obstructive sleep apnea (adult) (pediatric): Secondary | ICD-10-CM | POA: Diagnosis present

## 2014-03-15 DIAGNOSIS — R1013 Epigastric pain: Secondary | ICD-10-CM

## 2014-03-15 DIAGNOSIS — G8929 Other chronic pain: Secondary | ICD-10-CM | POA: Diagnosis present

## 2014-03-15 DIAGNOSIS — N179 Acute kidney failure, unspecified: Secondary | ICD-10-CM | POA: Diagnosis not present

## 2014-03-15 DIAGNOSIS — R110 Nausea: Secondary | ICD-10-CM | POA: Diagnosis present

## 2014-03-15 DIAGNOSIS — R109 Unspecified abdominal pain: Secondary | ICD-10-CM | POA: Diagnosis present

## 2014-03-15 DIAGNOSIS — E785 Hyperlipidemia, unspecified: Secondary | ICD-10-CM | POA: Diagnosis present

## 2014-03-15 DIAGNOSIS — Z8249 Family history of ischemic heart disease and other diseases of the circulatory system: Secondary | ICD-10-CM

## 2014-03-15 DIAGNOSIS — Z6841 Body Mass Index (BMI) 40.0 and over, adult: Secondary | ICD-10-CM

## 2014-03-15 DIAGNOSIS — M1990 Unspecified osteoarthritis, unspecified site: Secondary | ICD-10-CM | POA: Diagnosis present

## 2014-03-15 DIAGNOSIS — I509 Heart failure, unspecified: Secondary | ICD-10-CM | POA: Diagnosis present

## 2014-03-15 DIAGNOSIS — R197 Diarrhea, unspecified: Secondary | ICD-10-CM

## 2014-03-15 DIAGNOSIS — E119 Type 2 diabetes mellitus without complications: Secondary | ICD-10-CM

## 2014-03-15 DIAGNOSIS — I10 Essential (primary) hypertension: Secondary | ICD-10-CM | POA: Diagnosis present

## 2014-03-15 DIAGNOSIS — Z87891 Personal history of nicotine dependence: Secondary | ICD-10-CM

## 2014-03-15 DIAGNOSIS — E860 Dehydration: Secondary | ICD-10-CM | POA: Diagnosis present

## 2014-03-15 DIAGNOSIS — Z7982 Long term (current) use of aspirin: Secondary | ICD-10-CM

## 2014-03-15 DIAGNOSIS — R634 Abnormal weight loss: Secondary | ICD-10-CM

## 2014-03-15 DIAGNOSIS — L03031 Cellulitis of right toe: Secondary | ICD-10-CM | POA: Diagnosis present

## 2014-03-15 DIAGNOSIS — D649 Anemia, unspecified: Secondary | ICD-10-CM | POA: Diagnosis present

## 2014-03-15 DIAGNOSIS — K219 Gastro-esophageal reflux disease without esophagitis: Secondary | ICD-10-CM | POA: Diagnosis present

## 2014-03-15 DIAGNOSIS — M21371 Foot drop, right foot: Secondary | ICD-10-CM | POA: Diagnosis present

## 2014-03-15 DIAGNOSIS — N200 Calculus of kidney: Secondary | ICD-10-CM

## 2014-03-15 LAB — COMPREHENSIVE METABOLIC PANEL
ALBUMIN: 3.5 g/dL (ref 3.5–5.2)
ALT: 10 U/L (ref 0–35)
ANION GAP: 16 — AB (ref 5–15)
AST: 13 U/L (ref 0–37)
Alkaline Phosphatase: 75 U/L (ref 39–117)
BILIRUBIN TOTAL: 0.3 mg/dL (ref 0.3–1.2)
BUN: 21 mg/dL (ref 6–23)
CHLORIDE: 95 meq/L — AB (ref 96–112)
CO2: 29 mEq/L (ref 19–32)
CREATININE: 1.82 mg/dL — AB (ref 0.50–1.10)
Calcium: 9.8 mg/dL (ref 8.4–10.5)
GFR calc Af Amer: 32 mL/min — ABNORMAL LOW (ref >90)
GFR calc non Af Amer: 27 mL/min — ABNORMAL LOW (ref >90)
Glucose, Bld: 139 mg/dL — ABNORMAL HIGH (ref 70–99)
Potassium: 3.7 mEq/L (ref 3.7–5.3)
Sodium: 140 mEq/L (ref 137–147)
Total Protein: 7.7 g/dL (ref 6.0–8.3)

## 2014-03-15 MED ORDER — SODIUM CHLORIDE 0.9 % IV BOLUS (SEPSIS)
1000.0000 mL | Freq: Once | INTRAVENOUS | Status: AC
  Administered 2014-03-15: 1000 mL via INTRAVENOUS

## 2014-03-15 MED ORDER — SODIUM CHLORIDE 0.9 % IV SOLN
1000.0000 mL | INTRAVENOUS | Status: DC
  Administered 2014-03-16: 1000 mL via INTRAVENOUS

## 2014-03-15 MED ORDER — SODIUM CHLORIDE 0.9 % IV SOLN
1000.0000 mL | Freq: Once | INTRAVENOUS | Status: AC
  Administered 2014-03-15: 1000 mL via INTRAVENOUS

## 2014-03-15 MED ORDER — ONDANSETRON HCL 4 MG/2ML IJ SOLN
4.0000 mg | Freq: Once | INTRAMUSCULAR | Status: AC
  Administered 2014-03-15: 4 mg via INTRAVENOUS
  Filled 2014-03-15: qty 2

## 2014-03-15 NOTE — ED Notes (Signed)
Pt has been having diarrhea since gallbladder surgery in Sept. Pt is tearful in triage.

## 2014-03-15 NOTE — ED Provider Notes (Addendum)
CSN: [id number]     Arrival date & time 03/15/14  2059 History  This chart was scribed for Ward GivensIva L Basir Niven, MD by Murriel HopperAlec Bankhead, ED Scribe. This patient was seen in room APA09/APA09 and the patient's care was started at 11:34 PM.   Chief Complaint  Patient presents with  . Diarrhea     (Consider location/radiation/quality/duration/timing/severity/associated sxs/prior Treatment) The history is provided by the patient. No language interpreter was used.    HPI Comments: Samantha RubensteinBarbara A Rennie is a 68 y.o. female who presents to the Emergency Department complaining of intermittent, aching abdominal pain in the epigastric region since June 25. Her galbladder was removed on September 9th after having a abnormalHIDA scan showing no emptying of the gallbladder. Patient states she continues to have the same aching pain that she had before her surgery and pt notes that she has had diarrhea 5-6 times per day since the surgery. She also notes that diarrhea has been worse since having cholecystectomy. Pain makes her feel nauseated. She notes that she feels weak, but is able to drink fluids with no problems. Pt notes that she has lost 70 pounds in the past year, and 10-15 pounds in the past two months. Pt denies fever. In addition to abdominal pain, she has redness of big toe and a ulcer on her left lower extremity that started last week, she saw her PCP 2 days ago and she has been taking prescription antibiotics (doxycycline and clindamycin) for the past two days, that is making her GI symptoms worse.   She states she has had a colonoscopy by Dr. Lovell SheehanJenkins that did not show anything abnormal within the past 5 years. She also had a recent cardiac cath to evaluate her epigastric abdominal pain in the past 2 months.  PCP: Dr. Sherwood GamblerFusco Surgeon Dr Lovell SheehanJenkins   Past Medical History  Diagnosis Date  . Brain aneurysm   . CHF (congestive heart failure)   . Neuropathy   . Diabetes mellitus   . Anemia   . Thyroid disease   .  Arthritis   . Macular degeneration   . Chest pain   . Hypertension   . Hyperlipidemia   . Obesity   . Abnormal stress test     -false positive, prob breast attenuation  . Hx of cardiac catheterization 11/2013    normal coronary arteries  . Sleep apnea     doesn't wear CPAP, sleeps in a lift chair  . Difficult intubation 02/08/14    re-intubated in PACU   Past Surgical History  Procedure Laterality Date  . Tonsillectomy    . Appendectomy    . Lower back    . Heel spurs    . Hammer toes    . Right knee sark    . Cerebral aneurysm repair    . Cardiac catheterization  12/22/13    done for "angina" and abnormal nuc study- normal coronary arteries  . Cardiac catheterization  2004    insignificant CAD  . Cholecystectomy N/A 02/08/2014    Procedure: LAPAROSCOPIC CHOLECYSTECTOMY;  Surgeon: Dalia HeadingMark A Jenkins, MD;  Location: [facility];  Service: General;  Laterality: N/A;   Family History  Problem Relation Age of Onset  . Diabetes      family history   . Arthritis      family history   . Diabetes Mother   . Hypertension Mother   . Seizures Sister    History  Substance Use Topics  . Smoking status: Former Smoker  Quit date: 07/17/2002  . Smokeless tobacco: Not on file  . Alcohol Use: No   Lives at home Lives with spouse   OB History   Grav Para Term Preterm Abortions TAB SAB Ect Mult Living                 Review of Systems  Gastrointestinal: Positive for nausea, vomiting, abdominal pain and diarrhea.  All other systems reviewed and are negative.     Allergies  Clarithromycin; Levofloxacin; Metronidazole; Sulfonamide derivatives; Amoxicillin-pot clavulanate; Amoxicillin-pot clavulanate; Clindamycin/lincomycin; and Lansoprazole  Home Medications   Prior to Admission medications   Medication Sig Start Date End Date Taking? Authorizing Provider  aspirin 81 MG tablet Take 162 mg by mouth daily.    Yes Historical Provider, MD  carvedilol (COREG) 25 MG tablet Take 1 tablet  (25 mg total) by mouth 2 (two) times daily with a meal. 02/15/14  Yes Runell GessJonathan J Berry, MD  Coenzyme Q10 (CO Q 10) 100 MG CAPS Take 100 mg by mouth 2 (two) times daily.    Yes Historical Provider, MD  doxycycline (VIBRA-TABS) 100 MG tablet Take 100 mg by mouth 2 (two) times daily. Started on 03/13/14   Yes Historical Provider, MD  enalapril (VASOTEC) 10 MG tablet Take 10 mg by mouth daily.   Yes Historical Provider, MD  folic acid (FOLVITE) 1 MG tablet Take 1 tablet (1 mg total) by mouth daily. 12/20/13  Yes Runell GessJonathan J Berry, MD  furosemide (LASIX) 40 MG tablet Take 80 mg by mouth daily. 07/04/13  Yes Runell GessJonathan J Berry, MD  gabapentin (NEURONTIN) 100 MG capsule Take 100 mg by mouth 3 (three) times daily.   Yes Historical Provider, MD  IRON PO Take 365 mg by mouth daily.    Yes Historical Provider, MD  levothyroxine (SYNTHROID, LEVOTHROID) 112 MCG tablet Take 112 mcg by mouth daily before breakfast.   Yes Historical Provider, MD  metFORMIN (GLUCOPHAGE) 500 MG tablet Take 500 mg by mouth 4 (four) times daily.   Yes Historical Provider, MD  niacin 500 MG tablet Take 500 mg by mouth 2 (two) times daily with a meal.   Yes Historical Provider, MD  pravastatin (PRAVACHOL) 80 MG tablet Take 1 tablet (80 mg total) by mouth daily. 11/25/12  Yes Governor Rooksichard A Weintraub, MD  ZETIA 10 MG tablet Take 1 tablet by mouth daily. 11/11/13  Yes Historical Provider, MD  glipiZIDE (GLUCOTROL) 10 MG tablet Take 2.5 mg by mouth daily as needed (for high blood sugar).    Historical Provider, MD  HYDROcodone-acetaminophen (NORCO) 7.5-325 MG per tablet Take 1 tablet by mouth every 6 (six) hours as needed for moderate pain.  06/28/13   Historical Provider, MD  traMADol (ULTRAM) 50 MG tablet Take 100 mg by mouth 3 (three) times daily. Takes 2 tablets 4 times daily. 02/23/12   Vickki HearingStanley E Harrison, MD   BP 109/53  Pulse 80  Temp(Src) 98.2 F (36.8 C) (Oral)  Resp 20  Ht 5\' 11"  (1.803 m)  Wt 290 lb (131.543 kg)  BMI 40.46 kg/m2  SpO2  94%  Vital signs normal   Physical Exam  Nursing note and vitals reviewed. Constitutional: She is oriented to person, place, and time. She appears well-developed and well-nourished.  Non-toxic appearance. She does not appear ill. No distress.  HENT:  Head: Normocephalic and atraumatic.  Right Ear: External ear normal.  Left Ear: External ear normal.  Nose: Nose normal. No mucosal edema or rhinorrhea.  Mouth/Throat: Oropharynx is clear and  moist and mucous membranes are normal. No dental abscesses or uvula swelling.  Tongue is dry   Eyes: Conjunctivae and EOM are normal. Pupils are equal, round, and reactive to light.  Neck: Normal range of motion and full passive range of motion without pain. Neck supple.  Cardiovascular: Normal rate, regular rhythm and normal heart sounds.  Exam reveals no gallop and no friction rub.   No murmur heard. Pulmonary/Chest: Effort normal and breath sounds normal. No respiratory distress. She has no wheezes. She has no rhonchi. She has no rales. She exhibits no tenderness and no crepitus.  Abdominal: Soft. Normal appearance and bowel sounds are normal. She exhibits no distension. There is no tenderness. There is no rebound and no guarding.  Non-tender abdomen to palpation. Pt indicates upper abdomen is where she feels the pain.  Musculoskeletal: Normal range of motion. She exhibits edema and tenderness.  Moves all extremities well.  Patient has a oval ulcer on her left lower extremity surrounding by some redness.  Patient has faint redness of her right great toe with mild warmth. There is no drainage from the toe.  Neurological: She is alert and oriented to person, place, and time. She has normal strength. No cranial nerve deficit.  Skin: Skin is warm, dry and intact. No rash noted. No erythema. There is pallor.  Psychiatric: She has a normal mood and affect. Her speech is normal and behavior is normal. Her mood appears not anxious.        ED Course   Procedures (including critical care time)  Medications  0.9 %  sodium chloride infusion (0 mLs Intravenous Stopped 03/16/14 0121)    Followed by  0.9 %  sodium chloride infusion (0 mLs Intravenous Stopped 03/16/14 0502)  sodium chloride 0.9 % bolus 1,000 mL (0 mLs Intravenous Stopped 03/15/14 2346)  ondansetron (ZOFRAN) injection 4 mg (4 mg Intravenous Given 03/15/14 2348)  iohexol (OMNIPAQUE) 300 MG/ML solution 50 mL (50 mLs Oral Contrast Given 03/16/14 0008)    Labs Review  DIAGNOSTIC STUDIES: Oxygen Saturation is 98% on room air, normal by my interpretation.    COORDINATION OF CARE: 11:43 PM Discussed treatment plan with pt at bedside and pt agreed to plan.   0400 patient was given her test results. She was advised to get a mammogram since she hasn't had one recently. Patient is concerned because she has the infection in her leg and she is not tolerating oral antibiotics. She has a long list of antibiotic she is allergic to. We discussed taking probiotics for her diarrhea and possible GI referral. I will talk to the hospitalist about admission because of her infections of her LLE and her right great toe. Pt has multiple antibiotic allergies and may need to be on IV vancomycin.   04:33 Dr Rito Ehrlich will come see patient and decide about admission.    Results for orders placed during the hospital encounter of 03/15/14  CLOSTRIDIUM DIFFICILE BY PCR      Result Value Ref Range   C difficile by pcr NEGATIVE  NEGATIVE  CBC WITH DIFFERENTIAL      Result Value Ref Range   WBC 10.5  4.0 - 10.5 K/uL   RBC 4.10  3.87 - 5.11 MIL/uL   Hemoglobin 11.9 (*) 12.0 - 15.0 g/dL   HCT 16.1 (*) 09.6 - 04.5 %   MCV 87.3  78.0 - 100.0 fL   MCH 29.0  26.0 - 34.0 pg   MCHC 33.2  30.0 - 36.0 g/dL  RDW 14.5  11.5 - 15.5 %   Platelets 296  150 - 400 K/uL   Neutrophils Relative % 74  43 - 77 %   Lymphocytes Relative 17  12 - 46 %   Monocytes Relative 7  3 - 12 %   Eosinophils Relative 2  0 - 5 %    Basophils Relative 0  0 - 1 %   Neutro Abs 7.8 (*) 1.7 - 7.7 K/uL   Lymphs Abs 1.8  0.7 - 4.0 K/uL   Monocytes Absolute 0.7  0.1 - 1.0 K/uL   Eosinophils Absolute 0.2  0.0 - 0.7 K/uL   Basophils Absolute 0.0  0.0 - 0.1 K/uL   WBC Morphology TOXIC GRANULATION    COMPREHENSIVE METABOLIC PANEL      Result Value Ref Range   Sodium 140  137 - 147 mEq/L   Potassium 3.7  3.7 - 5.3 mEq/L   Chloride 95 (*) 96 - 112 mEq/L   CO2 29  19 - 32 mEq/L   Glucose, Bld 139 (*) 70 - 99 mg/dL   BUN 21  6 - 23 mg/dL   Creatinine, Ser 4.54 (*) 0.50 - 1.10 mg/dL   Calcium 9.8  8.4 - 09.8 mg/dL   Total Protein 7.7  6.0 - 8.3 g/dL   Albumin 3.5  3.5 - 5.2 g/dL   AST 13  0 - 37 U/L   ALT 10  0 - 35 U/L   Alkaline Phosphatase 75  39 - 117 U/L   Total Bilirubin 0.3  0.3 - 1.2 mg/dL   GFR calc non Af Amer 27 (*) >90 mL/min   GFR calc Af Amer 32 (*) >90 mL/min   Anion gap 16 (*) 5 - 15  URINALYSIS, ROUTINE W REFLEX MICROSCOPIC      Result Value Ref Range   Color, Urine STRAW (*) YELLOW   APPearance CLEAR  CLEAR   Specific Gravity, Urine <1.005 (*) 1.005 - 1.030   pH 5.5  5.0 - 8.0   Glucose, UA NEGATIVE  NEGATIVE mg/dL   Hgb urine dipstick NEGATIVE  NEGATIVE   Bilirubin Urine NEGATIVE  NEGATIVE   Ketones, ur NEGATIVE  NEGATIVE mg/dL   Protein, ur NEGATIVE  NEGATIVE mg/dL   Urobilinogen, UA 0.2  0.0 - 1.0 mg/dL   Nitrite NEGATIVE  NEGATIVE   Leukocytes, UA TRACE (*) NEGATIVE  SEDIMENTATION RATE      Result Value Ref Range   Sed Rate 68 (*) 0 - 22 mm/hr  URINE MICROSCOPIC-ADD ON      Result Value Ref Range   Squamous Epithelial / LPF MANY (*) RARE   WBC, UA 0-2  <3 WBC/hpf   RBC / HPF 0-2  <3 RBC/hpf   Bacteria, UA FEW (*) RARE  URIC ACID      Result Value Ref Range   Uric Acid, Serum 13.4 (*) 2.4 - 7.0 mg/dL     Laboratory interpretation all normal except some worsening of her chronic renal insufficiency, mild anemia, elevated sedimentation rate, very elevated uric acid level c/w  gout    Imaging Review Ct Abdomen Pelvis Wo Contrast  03/16/2014   CLINICAL DATA:  Diarrhea and upper abdominal pain. Initial encounter.  EXAM: CT ABDOMEN AND PELVIS WITHOUT CONTRAST  TECHNIQUE: Multidetector CT imaging of the abdomen and pelvis was performed following the standard protocol without IV contrast.  COMPARISON:  None currently available  FINDINGS: BODY WALL: Coarse calcification noted in the inferior left breast, partially visualized.  There is a fatty periumbilical hernia.  LOWER CHEST: Diffuse coronary atherosclerosis.  ABDOMEN/PELVIS:  Liver: No focal abnormality.  Biliary: Cholecystectomy. Minimal tissue in the gallbladder fossa is likely scarring. No biliary ductal dilatation.  Pancreas: Unremarkable.  Spleen: Unremarkable.  Adrenals: 2.7 cm right adrenal mass with Hounsfield units consistent with adenoma. This is a chronic mass based on report from 09/29/2002.  Kidneys and ureters: Bilateral nephrolithiasis, nonobstructive. The largest stone on the left is in the lower pole and measures 11 mm. The largest on the right is in the lower pole at 14 mm.  Bladder: Unremarkable.  Reproductive: Unremarkable.  Bowel: No obstruction. Appendectomy.  Retroperitoneum: No mass or adenopathy.  Peritoneum: No ascites or pneumoperitoneum.  Vascular: No acute abnormality.  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  IMPRESSION: 1. No acute intra-abdominal findings. 2. Extensive bilateral nephrolithiasis. 3. Right adrenal adenoma.   Electronically Signed   By: Tiburcio Pea M.D.   On: 03/16/2014 02:31     EKG Interpretation None      MDM   Final diagnoses:  Cellulitis of left lower extremity without foot  Cellulitis of great toe, right  Epigastric abdominal pain  Diarrhea in adult patient  Acute gouty arthritis    Plan pending Dr Rito Ehrlich   Admitted   Devoria Albe, MD, FACEP    I personally performed the services  described in this documentation, which was scribed in my presence. The recorded information has been reviewed and considered.  Devoria Albe, MD, FACEP    Ward Givens, MD 03/16/14 1610  Ward Givens, MD 03/16/14 [phone]

## 2014-03-16 ENCOUNTER — Encounter (HOSPITAL_COMMUNITY): Payer: Self-pay | Admitting: Internal Medicine

## 2014-03-16 DIAGNOSIS — I10 Essential (primary) hypertension: Secondary | ICD-10-CM

## 2014-03-16 DIAGNOSIS — R197 Diarrhea, unspecified: Secondary | ICD-10-CM | POA: Diagnosis present

## 2014-03-16 DIAGNOSIS — Z8249 Family history of ischemic heart disease and other diseases of the circulatory system: Secondary | ICD-10-CM | POA: Diagnosis not present

## 2014-03-16 DIAGNOSIS — K219 Gastro-esophageal reflux disease without esophagitis: Secondary | ICD-10-CM | POA: Diagnosis present

## 2014-03-16 DIAGNOSIS — N200 Calculus of kidney: Secondary | ICD-10-CM

## 2014-03-16 DIAGNOSIS — N179 Acute kidney failure, unspecified: Secondary | ICD-10-CM | POA: Diagnosis present

## 2014-03-16 DIAGNOSIS — R110 Nausea: Secondary | ICD-10-CM | POA: Diagnosis present

## 2014-03-16 DIAGNOSIS — Z87891 Personal history of nicotine dependence: Secondary | ICD-10-CM | POA: Diagnosis not present

## 2014-03-16 DIAGNOSIS — Z6841 Body Mass Index (BMI) 40.0 and over, adult: Secondary | ICD-10-CM | POA: Diagnosis not present

## 2014-03-16 DIAGNOSIS — I509 Heart failure, unspecified: Secondary | ICD-10-CM | POA: Diagnosis present

## 2014-03-16 DIAGNOSIS — M1990 Unspecified osteoarthritis, unspecified site: Secondary | ICD-10-CM | POA: Diagnosis present

## 2014-03-16 DIAGNOSIS — Z833 Family history of diabetes mellitus: Secondary | ICD-10-CM | POA: Diagnosis not present

## 2014-03-16 DIAGNOSIS — G4733 Obstructive sleep apnea (adult) (pediatric): Secondary | ICD-10-CM | POA: Diagnosis present

## 2014-03-16 DIAGNOSIS — K529 Noninfective gastroenteritis and colitis, unspecified: Secondary | ICD-10-CM

## 2014-03-16 DIAGNOSIS — M21371 Foot drop, right foot: Secondary | ICD-10-CM | POA: Diagnosis present

## 2014-03-16 DIAGNOSIS — Z7982 Long term (current) use of aspirin: Secondary | ICD-10-CM | POA: Diagnosis not present

## 2014-03-16 DIAGNOSIS — Z79899 Other long term (current) drug therapy: Secondary | ICD-10-CM | POA: Diagnosis not present

## 2014-03-16 DIAGNOSIS — M79674 Pain in right toe(s): Secondary | ICD-10-CM | POA: Diagnosis present

## 2014-03-16 DIAGNOSIS — L03116 Cellulitis of left lower limb: Secondary | ICD-10-CM | POA: Diagnosis present

## 2014-03-16 DIAGNOSIS — E119 Type 2 diabetes mellitus without complications: Secondary | ICD-10-CM

## 2014-03-16 DIAGNOSIS — H3530 Unspecified macular degeneration: Secondary | ICD-10-CM | POA: Diagnosis present

## 2014-03-16 DIAGNOSIS — E860 Dehydration: Secondary | ICD-10-CM | POA: Diagnosis present

## 2014-03-16 DIAGNOSIS — L03031 Cellulitis of right toe: Secondary | ICD-10-CM | POA: Diagnosis present

## 2014-03-16 DIAGNOSIS — D649 Anemia, unspecified: Secondary | ICD-10-CM | POA: Diagnosis present

## 2014-03-16 DIAGNOSIS — R1084 Generalized abdominal pain: Secondary | ICD-10-CM

## 2014-03-16 DIAGNOSIS — E785 Hyperlipidemia, unspecified: Secondary | ICD-10-CM | POA: Diagnosis present

## 2014-03-16 DIAGNOSIS — G8929 Other chronic pain: Secondary | ICD-10-CM | POA: Diagnosis present

## 2014-03-16 LAB — URINALYSIS, ROUTINE W REFLEX MICROSCOPIC
Bilirubin Urine: NEGATIVE
Glucose, UA: NEGATIVE mg/dL
Hgb urine dipstick: NEGATIVE
KETONES UR: NEGATIVE mg/dL
Nitrite: NEGATIVE
PROTEIN: NEGATIVE mg/dL
Specific Gravity, Urine: <1.005 — ABNORMAL LOW (ref 1.005–1.030)
Urobilinogen, UA: 0.2 mg/dL (ref 0.0–1.0)
pH: 5.5 (ref 5.0–8.0)

## 2014-03-16 LAB — HEMOGLOBIN A1C
HEMOGLOBIN A1C: 6.8 % — AB (ref <5.7)
MEAN PLASMA GLUCOSE: 148 mg/dL — AB (ref <117)

## 2014-03-16 LAB — CBC WITH DIFFERENTIAL/PLATELET
BASOS ABS: 0 10*3/uL (ref 0.0–0.1)
Basophils Relative: 0 % (ref 0–1)
Eosinophils Absolute: 0.2 10*3/uL (ref 0.0–0.7)
Eosinophils Relative: 2 % (ref 0–5)
HEMATOCRIT: 35.8 % — AB (ref 36.0–46.0)
Hemoglobin: 11.9 g/dL — ABNORMAL LOW (ref 12.0–15.0)
LYMPHS ABS: 1.8 10*3/uL (ref 0.7–4.0)
Lymphocytes Relative: 17 % (ref 12–46)
MCH: 29 pg (ref 26.0–34.0)
MCHC: 33.2 g/dL (ref 30.0–36.0)
MCV: 87.3 fL (ref 78.0–100.0)
MONO ABS: 0.7 10*3/uL (ref 0.1–1.0)
MONOS PCT: 7 % (ref 3–12)
NEUTROS ABS: 7.8 10*3/uL — AB (ref 1.7–7.7)
Neutrophils Relative %: 74 % (ref 43–77)
Platelets: 296 10*3/uL (ref 150–400)
RBC: 4.1 MIL/uL (ref 3.87–5.11)
RDW: 14.5 % (ref 11.5–15.5)
WBC: 10.5 10*3/uL (ref 4.0–10.5)

## 2014-03-16 LAB — SEDIMENTATION RATE: Sed Rate: 68 mm/hr — ABNORMAL HIGH (ref 0–22)

## 2014-03-16 LAB — GLUCOSE, CAPILLARY
GLUCOSE-CAPILLARY: 110 mg/dL — AB (ref 70–99)
Glucose-Capillary: 117 mg/dL — ABNORMAL HIGH (ref 70–99)
Glucose-Capillary: 185 mg/dL — ABNORMAL HIGH (ref 70–99)
Glucose-Capillary: 133 mg/dL — ABNORMAL HIGH (ref 70–99)

## 2014-03-16 LAB — URIC ACID: URIC ACID, SERUM: 13.4 mg/dL — AB (ref 2.4–7.0)

## 2014-03-16 LAB — URINE MICROSCOPIC-ADD ON

## 2014-03-16 LAB — T4, FREE: FREE T4: 1.21 ng/dL (ref 0.80–1.80)

## 2014-03-16 LAB — CLOSTRIDIUM DIFFICILE BY PCR: Toxigenic C. Difficile by PCR: NEGATIVE

## 2014-03-16 MED ORDER — GABAPENTIN 100 MG PO CAPS
100.0000 mg | ORAL_CAPSULE | Freq: Three times a day (TID) | ORAL | Status: DC
  Administered 2014-03-16 – 2014-03-18 (×8): 100 mg via ORAL
  Filled 2014-03-16 (×8): qty 1

## 2014-03-16 MED ORDER — IOHEXOL 300 MG/ML  SOLN
50.0000 mL | Freq: Once | INTRAMUSCULAR | Status: AC | PRN
  Administered 2014-03-16: 50 mL via ORAL

## 2014-03-16 MED ORDER — PRO-STAT SUGAR FREE PO LIQD
30.0000 mL | Freq: Three times a day (TID) | ORAL | Status: DC
  Administered 2014-03-16 – 2014-03-18 (×6): 30 mL via ORAL
  Filled 2014-03-16 (×6): qty 30

## 2014-03-16 MED ORDER — SODIUM CHLORIDE 0.9 % IV SOLN
INTRAVENOUS | Status: AC
  Administered 2014-03-16: 06:00:00 via INTRAVENOUS

## 2014-03-16 MED ORDER — OXYCODONE HCL 5 MG PO TABS
5.0000 mg | ORAL_TABLET | ORAL | Status: DC | PRN
  Administered 2014-03-16 – 2014-03-18 (×3): 5 mg via ORAL
  Filled 2014-03-16 (×4): qty 1

## 2014-03-16 MED ORDER — ACETAMINOPHEN 650 MG RE SUPP: 650.0000 mg | Freq: Four times a day (QID) | RECTAL | Status: DC | PRN

## 2014-03-16 MED ORDER — ACETAMINOPHEN 325 MG PO TABS
650.0000 mg | ORAL_TABLET | Freq: Four times a day (QID) | ORAL | Status: DC | PRN
  Administered 2014-03-18: 650 mg via ORAL
  Filled 2014-03-16: qty 2

## 2014-03-16 MED ORDER — CARVEDILOL 12.5 MG PO TABS
25.0000 mg | ORAL_TABLET | Freq: Two times a day (BID) | ORAL | Status: DC
  Administered 2014-03-16 – 2014-03-18 (×5): 25 mg via ORAL
  Filled 2014-03-16 (×5): qty 2

## 2014-03-16 MED ORDER — ONDANSETRON HCL 4 MG/2ML IJ SOLN: 4.0000 mg | Freq: Four times a day (QID) | INTRAMUSCULAR | Status: DC | PRN

## 2014-03-16 MED ORDER — VANCOMYCIN HCL IN DEXTROSE 1-5 GM/200ML-% IV SOLN
1000.0000 mg | INTRAVENOUS | Status: DC
  Filled 2014-03-16: qty 200

## 2014-03-16 MED ORDER — EZETIMIBE 10 MG PO TABS
10.0000 mg | ORAL_TABLET | Freq: Every day | ORAL | Status: DC
  Administered 2014-03-16 – 2014-03-18 (×3): 10 mg via ORAL
  Filled 2014-03-16 (×3): qty 1

## 2014-03-16 MED ORDER — ALUM & MAG HYDROXIDE-SIMETH 200-200-20 MG/5ML PO SUSP: 30.0000 mL | Freq: Four times a day (QID) | ORAL | Status: DC | PRN

## 2014-03-16 MED ORDER — MORPHINE SULFATE 2 MG/ML IJ SOLN
1.0000 mg | INTRAMUSCULAR | Status: DC | PRN
  Administered 2014-03-16 – 2014-03-17 (×2): 1 mg via INTRAVENOUS
  Filled 2014-03-16 (×2): qty 1

## 2014-03-16 MED ORDER — ONDANSETRON HCL 4 MG PO TABS: 4.0000 mg | ORAL_TABLET | Freq: Four times a day (QID) | ORAL | Status: DC | PRN

## 2014-03-16 MED ORDER — FAMOTIDINE IN NACL 20-0.9 MG/50ML-% IV SOLN
INTRAVENOUS | Status: AC
  Filled 2014-03-16: qty 50

## 2014-03-16 MED ORDER — INSULIN ASPART 100 UNIT/ML ~~LOC~~ SOLN
0.0000 [IU] | Freq: Three times a day (TID) | SUBCUTANEOUS | Status: DC
  Administered 2014-03-16: 4 [IU] via SUBCUTANEOUS
  Administered 2014-03-17 (×2): 3 [IU] via SUBCUTANEOUS
  Administered 2014-03-18: 4 [IU] via SUBCUTANEOUS
  Administered 2014-03-18: 3 [IU] via SUBCUTANEOUS

## 2014-03-16 MED ORDER — SACCHAROMYCES BOULARDII 250 MG PO CAPS
250.0000 mg | ORAL_CAPSULE | Freq: Two times a day (BID) | ORAL | Status: DC
  Administered 2014-03-16 – 2014-03-18 (×5): 250 mg via ORAL
  Filled 2014-03-16 (×5): qty 1

## 2014-03-16 MED ORDER — FOLIC ACID 1 MG PO TABS
1.0000 mg | ORAL_TABLET | Freq: Every day | ORAL | Status: DC
  Administered 2014-03-16 – 2014-03-18 (×3): 1 mg via ORAL
  Filled 2014-03-16 (×3): qty 1

## 2014-03-16 MED ORDER — INSULIN ASPART 100 UNIT/ML ~~LOC~~ SOLN: 0.0000 [IU] | Freq: Every day | SUBCUTANEOUS | Status: DC

## 2014-03-16 MED ORDER — VANCOMYCIN HCL 10 G IV SOLR
1500.0000 mg | INTRAVENOUS | Status: DC
  Administered 2014-03-16: 1500 mg via INTRAVENOUS
  Filled 2014-03-16 (×2): qty 1500

## 2014-03-16 MED ORDER — DOXYCYCLINE HYCLATE 100 MG PO TABS
100.0000 mg | ORAL_TABLET | Freq: Two times a day (BID) | ORAL | Status: DC
  Administered 2014-03-16 – 2014-03-18 (×4): 100 mg via ORAL
  Filled 2014-03-16 (×4): qty 1

## 2014-03-16 MED ORDER — ALBUTEROL SULFATE (2.5 MG/3ML) 0.083% IN NEBU: 2.5000 mg | INHALATION_SOLUTION | RESPIRATORY_TRACT | Status: DC | PRN

## 2014-03-16 MED ORDER — HEPARIN SODIUM (PORCINE) 5000 UNIT/ML IJ SOLN
5000.0000 [IU] | Freq: Three times a day (TID) | INTRAMUSCULAR | Status: DC
  Administered 2014-03-16 – 2014-03-18 (×8): 5000 [IU] via SUBCUTANEOUS
  Filled 2014-03-16 (×8): qty 1

## 2014-03-16 MED ORDER — LEVOTHYROXINE SODIUM 112 MCG PO TABS
112.0000 ug | ORAL_TABLET | Freq: Every day | ORAL | Status: DC
  Administered 2014-03-16 – 2014-03-18 (×3): 112 ug via ORAL
  Filled 2014-03-16 (×3): qty 1

## 2014-03-16 MED ORDER — PRAVASTATIN SODIUM 40 MG PO TABS
80.0000 mg | ORAL_TABLET | Freq: Every day | ORAL | Status: DC
  Administered 2014-03-16 – 2014-03-17 (×2): 80 mg via ORAL
  Filled 2014-03-16 (×2): qty 2

## 2014-03-16 MED ORDER — LOPERAMIDE HCL 2 MG PO CAPS
4.0000 mg | ORAL_CAPSULE | Freq: Three times a day (TID) | ORAL | Status: DC | PRN
  Administered 2014-03-16: 4 mg via ORAL
  Filled 2014-03-16: qty 2

## 2014-03-16 MED ORDER — FAMOTIDINE IN NACL 20-0.9 MG/50ML-% IV SOLN
20.0000 mg | Freq: Two times a day (BID) | INTRAVENOUS | Status: DC
  Administered 2014-03-16: 20 mg via INTRAVENOUS
  Filled 2014-03-16 (×3): qty 50

## 2014-03-16 NOTE — Progress Notes (Signed)
INITIAL NUTRITION ASSESSMENT  DOCUMENTATION CODES Per approved criteria  -Morbid Obesity   INTERVENTION: ProStat 30 ml TID (each 30 ml provides 100 kcal, 15 gr protein)   NUTRITION DIAGNOSIS: Inadequate oral intake related to acute renal failure, abdominal pain as evidenced by NPO status and 10-15# wt loss over past few months  Goal: Pt to meet >/= 90% of their estimated nutrition needs     Monitor: Po intake, labs and wt trends   Reason for Assessment: Malnutrition Screen Score =  3  68 y.o. female  Admitting Dx: ARF (acute renal failure)  ASSESSMENT:  Pt presents with abdominal pain which has been persistent past 4 months. Weight loss of 3% past 60-90 days is not clinically significant.  Diet has advanced to full liquids. Pt denies abdominal pain currently.  No overt physical signs of malnutrition. Add protein modular due to increased protein requirements.  Height: Ht Readings from Last 1 Encounters:  03/15/14 5\' 11"  (1.803 m)    Weight: Wt Readings from Last 1 Encounters:  03/15/14 290 lb (131.543 kg)    Ideal Body Weight: 160# (73 kg)  % Ideal Body Weight: 181%  Wt Readings from Last 10 Encounters:  03/15/14 290 lb (131.543 kg)  02/08/14 292 lb (132.45 kg)  02/08/14 292 lb (132.45 kg)  02/03/14 292 lb (132.45 kg)  01/04/14 296 lb 1.6 oz (134.31 kg)  12/22/13 297 lb (134.718 kg)  12/22/13 297 lb (134.718 kg)  12/13/13 298 lb (135.172 kg)  12/06/13 307 lb (139.254 kg)  08/10/13 307 lb (139.254 kg)    Usual Body Weight: 300#  % Usual Body Weight: 97%  BMI:  Body mass index is 40.46 kg/(m^2). obesity class III  Estimated Nutritional Needs: Kcal: 1800-2000 Protein: 95-115 gr Fluid: 1.8-2.0 liters daily  Skin: cellulitis left leg  Diet Order: Full liquids  EDUCATION NEEDS: -No education needs identified at this time   Intake/Output Summary (Last 24 hours) at 03/16/14 0812 Last data filed at 03/16/14 0700  Gross per 24 hour  Intake      0 ml   Output    251 ml  Net   -251 ml    Last BM: chronic diarrhea-- 10/15 (large watery stool)   Labs:   Recent Labs Lab 03/15/14 2330  NA 140  K 3.7  CL 95*  CO2 29  BUN 21  CREATININE 1.82*  CALCIUM 9.8  GLUCOSE 139*    CBG (last 3)   Recent Labs  03/16/14 0736  GLUCAP 117*    Scheduled Meds: . carvedilol  25 mg Oral BID WC  . ezetimibe  10 mg Oral Daily  . famotidine (PEPCID) IV  20 mg Intravenous Q12H  . folic acid  1 mg Oral Daily  . gabapentin  100 mg Oral TID  . heparin  5,000 Units Subcutaneous 3 times per day  . insulin aspart  0-20 Units Subcutaneous TID WC  . insulin aspart  0-5 Units Subcutaneous QHS  . levothyroxine  112 mcg Oral QAC breakfast  . pravastatin  80 mg Oral QPC supper  . saccharomyces boulardii  250 mg Oral BID  . vancomycin  1,500 mg Intravenous Q24H    Continuous Infusions: . sodium chloride 75 mL/hr at 03/16/14 [id number]    Past Medical History  Diagnosis Date  . Brain aneurysm   . CHF (congestive heart failure)   . Neuropathy   . Diabetes mellitus   . Anemia   . Thyroid disease   . Arthritis   .  Macular degeneration   . Chest pain   . Hypertension   . Hyperlipidemia   . Obesity   . Abnormal stress test     -false positive, prob breast attenuation  . Hx of cardiac catheterization 11/2013    normal coronary arteries  . Sleep apnea     doesn't wear CPAP, sleeps in a lift chair  . Difficult intubation 02/08/14    re-intubated in PACU    Past Surgical History  Procedure Laterality Date  . Tonsillectomy    . Appendectomy    . Lower back    . Heel spurs    . Hammer toes    . Right knee sark    . Cerebral aneurysm repair    . Cardiac catheterization  12/22/13    done for "angina" and abnormal nuc study- normal coronary arteries  . Cardiac catheterization  2004    insignificant CAD  . Cholecystectomy N/A 02/08/2014    Procedure: LAPAROSCOPIC CHOLECYSTECTOMY;  Surgeon: Dalia HeadingMark A Jenkins, MD;  Location: [facility];  Service:  General;  Laterality: N/A;  . Esophagogastroduodenoscopy  2007    Dr. Jena Gaussourk: mottled patchy erythema of stomach, small bowel bx negative    Royann ShiversLynn Daimon Kean MS,RD,CSG,LDN Office: [phone] Pager: [phone]

## 2014-03-16 NOTE — Care Management Note (Unsigned)
    Page 1 of 1   03/17/2014     5:03:15 PM CARE MANAGEMENT NOTE 03/17/2014  Patient:  Samantha [name],Samantha [name]   Account Number:  [id number]  Date Initiated:  03/16/2014  Documentation initiated by:  Anibal HendersonBOLDEN,Keith Cancio  Subjective/Objective Assessment:   Admitted with abd pain, ARF. Pt is from home, with spouse, but is pretty much [name] total care pt. She wishes to speak to the CSW about possible placement, but would like to wait until the AM     Action/Plan:   CSW aware and will see pt tomorrow   Anticipated DC Date:  03/18/2014   Anticipated DC Plan:  HOME W HOME HEALTH SERVICES  In-house referral  Clinical Social Worker      DC Associate Professorlanning Services  CM consult      [facility]   Choice offered to / List presented to:  C-1 Patient        HH arranged  HH-1 RN  HH-2 PT      [facility]  [facility]   Status of service:  In process, will continue to follow Medicare Important Message given?  YES (If response is "NO", the following Medicare IM given date fields will be blank) Date Medicare IM given:  03/17/2014 Medicare IM given by:  Anibal HendersonBOLDEN,Edwards Mckelvie Date Additional Medicare IM given:   Additional Medicare IM given by:    Discharge Disposition:  HOME W HOME HEALTH SERVICES  Per UR Regulation:  Reviewed for med. necessity/level of care/duration of stay  If discussed at Long Length of Stay Meetings, dates discussed:    Comments:  03/17/14  1700  Anibal HendersonGeneva Arren Laminack RN/CM Pt decided to go to SNF then changed her mind and is going home with family and HH. 03/16/14 1540 Anibal HendersonGeneva Tyreka Henneke RN/CM

## 2014-03-16 NOTE — Progress Notes (Signed)
Chart reviewed and patient examined.   Brief summary: 68 yo woman with hx chronic diarrhea and recent cholecystectomy that did not improve diarrhea admitted for acute renal failure and cellulitis of lower leg.   Sub: reports feeling better with no stool since admission. Denies pain/discomfort.   Obj: VSS Afebrile  CT abdomen/pelvis  No acute intra-abdominal findings. Extensive bilateral nephrolithiasis. Right adrenal adenoma (since 2004).    PE: General: obese, appears comfortable CV RRR No MGR No LE edema Respiratory: normal effort BS clear i hear no wheeze Abdomen: obese soft +BS but sluggish Skin: bilateral LE edema from mid shin to ankles. Left lateral LE with small healing open wound. No drainage.  Musculoskeletal: no clubbing or cyanosis Neuro: alert and oriented.   Assessment/plan: #1 acute renal failure: continue IV fluids. Holding her nephrotoxic agents , including Lasix, and ACE inhibitor. Repeat renal function in the morning.   #2 left lower extremity cellulitis: mild. Continue vancomycin.  She's allergic to multiple antibiotics. She can be transitioned back to doxycycline once the infection starts improving.    #3 chronic diarrhea with nausea and abdominal pain: This all started back in June. Cholecystectomy hasn't helped her symptoms. Etiology remains unclear. No stool since admssion and administration of immodium. C. difficile is negative. Await GI consult  #4 right foot drop: This is been ongoing for the past many months. She was supposed to be referred to neurology, but she hasn't had the time to followup. Will defer this to her PCP.   #5 diabetes mellitus, type II: Initiate sliding scale coverage. Controlled.  HgA1c pending.   #6 history of hypertension: fair control.  Hydralazine as needed.   #7. Right great toe pain: uric acid 13.4. No hx gout. Consider colchicine once #1 improves.    Clydie BraunKaren m. Venetia Prewitt, NP

## 2014-03-16 NOTE — Progress Notes (Signed)
Patient seen, independently examined and chart reviewed. I agree with exam, assessment and plan discussed with Samantha SmothersKaren Black, NP.  [phone] with abd pain since June, worked up with cardiac cath (unremarkable). HIDA revealed biliary dyskinesia. Subsequently underwent cholecystectomy but abd pain has not improved nor has diarrhea. Recently started on abx for leg or toe infection. Admitted for acute renal failure, LLE cellulitis, chronic diarrhea/n/abd pain.  PMH DM type 2 HTN Morbid obesity OSA not on CPAP  Subjective: Diarrhea has stopped--none since yesterday. Abdominal pain has improved. Tolerating liquids. Right 1st toe improving as well.  Objective: afebrile, VSS  Gen. Appears calm, comfortable  Psych. Alert, speech fluent and clear  Cardiovascular. RRR no m/r/g. No LE edema.  Respiratory. CTA bilaterally no w/r/r. Normal respiratory effort.  Abdomen. Obese, soft, non-tender, pannus unremarkable.  Skin. Right 1st toe with slight erythema, non-tender. Normal capillary refill.   Admission workup reviewed  Acute renal failure Right great toe cellulitis Chronic abd pain, n/v, weight loss Morbid obesity  SNF per PT, OT  Overall feeling better. Etiology of ongoing diarrhea and abdominal pain unclear. CT ab/pelvis unrevealing. Continue liquids, check labs in AM, continue IVF. Further recommendations per GI. Cellulitis rapidly improving, change to doxycycline.  Samantha Sacksaniel Dakisha Schoof, MD [facility] [phone]

## 2014-03-16 NOTE — Evaluation (Signed)
Physical Therapy Evaluation Patient Details Name: Samantha RubensteinBarbara A [name] MRN: [id number] DOB: 02-06-1946 Today's Date: 03/16/2014   History of Present Illness  Samantha RubensteinBarbara A [name] is a 68 y.o. female with the past medical history of morbid obesity, diabetes mellitus, type II, hypertension, who has been having nausea, Upper abdominal pain, and diarrhea since June. She also had some chest pain at that time, for which she was referred to cardiology. She underwent cardiac catheterization, which did not show any significant stenosis. She underwent HIDA scan which revealed biliary dyskinesia. She underwent cholecystectomy. However, her symptoms did not improve. Diarrhea persisted. She was having 2-3 loose stools every day without any blood. She was started on clindamycin and doxycycline a few days ago, for cellulitis of her left leg. And as a result her diarrhea got worse. She's had persistent nausea with upper abdominal pain, and heartburns. She's lost about 10-15 pounds in the last few months. She reports having had a colonoscopy 5 years ago, which apparently, was normal. She's become more and more weak. And, hence, she decided to come back to the hospital. Abdominal pain is 10/10, constant, dull, aching pain in the upper abdomen without any radiation.  Clinical Impression  Pt is a 68 year old female who presents to PT with dx of acute renal failure.  Pt reports an extensive hx beginning in June with diarrhea and decreasing strength/endurance.  Pt underwent gallbladder procedure (pt unable to specify what was completed) in September 2015, and reports since this event she has been too weak to walk.  Pt [facility] stand [facility].  Per pt, she had contacted her PCP earlier this week (prior to admission to hospital) to question rehab to assist with return to walking.  During evaluation, pt required mod assist with some bed mobility skills, min guard and use of RW for transfers.   Pt did require increased height of bed and use of momentum/multiple attempts to transfer to standing.  Unable to attempt clearing feet, stand pivot transfer, or gait as pt only able to tolerate standing for ~5 seconds prior to returning to sitting secondary to fatigue.  Lengthy discussion held with pt regarding d/c plans and rehabilitation.  Pt reports a goal of increasing independence and return to walking.  Recommended to pt with these goals in mind, SNF placement would be required to work on transfers, activity tolerance for improved functional mobility skills and return to gait.  No DME recommendation at this time.      Follow Up Recommendations SNF    Equipment Recommendations  None recommended by PT    Recommendations for Other Services OT consult     Precautions / Restrictions Precautions Precautions: Fall Restrictions Weight Bearing Restrictions: No      Mobility  Bed Mobility Overal bed mobility: Needs Assistance Bed Mobility: Sidelying to Sit;Supine to Sit   Sidelying to sit: HOB elevated;Supervision (HOB elevated (like lift chair)) Supine to sit: Mod assist (Mod assist for LE to get into bed and position in bed)        Transfers Overall transfer level: Needs assistance Equipment used: Rolling walker (2 wheeled) Transfers: Sit to/from Stand Sit to Stand: From elevated surface;Supervision;Min guard         General transfer comment: Pt required increased attempts and use of momentum to transfer to standng.   Ambulation/Gait             General Gait Details: Unable to attempt  seconary to fatigue (pt only able to tolerate standing for ~5 seconds)      Balance Overall balance assessment: Needs assistance Sitting-balance support: Feet supported;No upper extremity supported Sitting balance-Leahy Scale: Good     Standing balance support: Bilateral upper extremity supported;During functional activity Standing balance-Leahy Scale: Fair                                Pertinent Vitals/Pain Pain Assessment: 0-10 Pain Score: 5  Pain Location: Abdomen Pain Descriptors / Indicators: Aching;Discomfort Pain Intervention(s): Limited activity within patient's tolerance;Repositioned;Patient requesting pain meds-RN notified    Home Living Family/patient expects to be discharged to:: Skilled nursing facility Living Arrangements: Spouse/significant other Available Help at Discharge: Family;Available 24 hours/day (Daughter lives next door and is available PRN) Type of Home: House Home Access: Level entry     Home Layout: Laundry or work area in basement;Able to live on main level with bedroom/bathroom (Husband does laundry) Home Equipment: Bedside commode;Walker - 2 wheels (Lift chair)      Prior Function Level of Independence: Needs assistance;Independent with assistive device(s)   Gait / Transfers Assistance Needed: Pt reports she is mod (I) [facility].  Pt reports she was last able to amb in the home with RW September before her gallbladder procedure.   ADL's / Homemaking Assistance Needed: Pt requires set up assist with all ADLs.          Hand Dominance   Dominant Hand: Right    Extremity/Trunk Assessment   Upper Extremity Assessment: Defer to OT evaluation           Lower Extremity Assessment: RLE deficits/detail;LLE deficits/detail RLE Deficits / Details: Foot drop Rt ankle.  MMT Rt hip 3/5, knee 3+/5 LLE Deficits / Details: MMT Lt hip 3/5, knee 3+/5, ankle 3+/5     Communication   Communication: No difficulties  Cognition Arousal/Alertness: Awake/alert Behavior During Therapy: WFL for tasks assessed/performed Overall Cognitive Status: Within Functional Limits for tasks assessed                        Assessment/Plan    PT Assessment Patient needs continued PT services  PT Diagnosis Difficulty walking;Generalized weakness;Acute pain   PT Problem List  Decreased strength;Decreased activity tolerance;Decreased balance;Decreased mobility  PT Treatment Interventions Balance training;Gait training;Functional mobility training;Therapeutic activities;Therapeutic exercise;Patient/family education   PT Goals (Current goals can be found in the Care Plan section) Acute Rehab PT Goals Patient Stated Goal: increase independence, walk PT Goal Formulation: With patient Time For Goal Achievement: 03/30/14 Potential to Achieve Goals: Fair    Frequency Min 2X/week   Barriers to discharge Inaccessible home environment         End of Session Equipment Utilized During Treatment: Gait belt Activity Tolerance: Patient limited by fatigue Patient left: in bed;with call bell/phone within reach;with bed alarm set           Time: 0932-1006 PT Time Calculation (min): 34 min   Charges:   PT Evaluation $Initial PT Evaluation Tier I: 1 Procedure PT Treatments $Self Care/Home Management: 8-22 (Educated pt on d/c recommendations (SNF vs. HHPT vs. HHPT ->  OOPT), discussed goals/rehab expections/potential)    Samantha [name] 03/16/2014, 10:26 AM

## 2014-03-16 NOTE — Progress Notes (Signed)
ANTIBIOTIC CONSULT NOTE - INITIAL  Pharmacy Consult for vancomycin Indication: cellulitis of left lower extremity  Allergies  Allergen Reactions  . Clarithromycin Anaphylaxis  . Levofloxacin Anaphylaxis  . Metronidazole Anaphylaxis  . Sulfonamide Derivatives Anaphylaxis  . Amoxicillin-Pot Clavulanate     Heart palpitations  . Amoxicillin-Pot Clavulanate   . Clindamycin/Lincomycin Diarrhea and Nausea And Vomiting  . Lansoprazole     Unknown    Patient Measurements: Height: 5\' 11"  (180.3 cm) Weight: 290 lb (131.543 kg) IBW/kg (Calculated) : 70.8 Adjusted Body Weight: 91 kg  Vital Signs: Temp: 98.2 F (36.8 C) (10/14 2102) Temp Source: Oral (10/14 2102) BP: 114/54 mmHg (10/15 0518) Pulse Rate: 84 (10/15 0518) Intake/Output from previous day: 10/14 0701 - 10/15 0700 In: -  Out: 1 [Stool:1] Intake/Output from this shift: Total I/O In: -  Out: 1 [Stool:1]  Labs:  Recent Labs  03/15/14 2330  WBC 10.5  HGB 11.9*  PLT 296  CREATININE 1.82*   Estimated Creatinine Clearance: 44.4 ml/min (by C-G formula based on Cr of 1.82). No results found for this basename: VANCOTROUGH, Leodis BinetVANCOPEAK, VANCORANDOM, GENTTROUGH, GENTPEAK, GENTRANDOM, TOBRATROUGH, TOBRAPEAK, TOBRARND, AMIKACINPEAK, AMIKACINTROU, AMIKACIN,  in the last 72 hours   Microbiology: Recent Results (from the past 720 hour(s))  CLOSTRIDIUM DIFFICILE BY PCR     Status: None   Collection Time    03/16/14  2:37 AM      Result Value Ref Range Status   C difficile by pcr NEGATIVE  NEGATIVE Final    Medical History: Past Medical History  Diagnosis Date  . Brain aneurysm   . CHF (congestive heart failure)   . Neuropathy   . Diabetes mellitus   . Anemia   . Thyroid disease   . Arthritis   . Macular degeneration   . Chest pain   . Hypertension   . Hyperlipidemia   . Obesity   . Abnormal stress test     -false positive, prob breast attenuation  . Hx of cardiac catheterization 11/2013    normal coronary  arteries  . Sleep apnea     doesn't wear CPAP, sleeps in a lift chair  . Difficult intubation 02/08/14    re-intubated in PACU    Medications:  Scheduled:  . carvedilol  25 mg Oral BID WC  . ezetimibe  10 mg Oral Daily  . famotidine (PEPCID) IV  20 mg Intravenous Q12H  . folic acid  1 mg Oral Daily  . gabapentin  100 mg Oral TID  . heparin  5,000 Units Subcutaneous 3 times per day  . insulin aspart  0-20 Units Subcutaneous TID WC  . insulin aspart  0-5 Units Subcutaneous QHS  . levothyroxine  112 mcg Oral QAC breakfast  . pravastatin  80 mg Oral QPC supper  . saccharomyces boulardii  250 mg Oral BID  . vancomycin  1,000 mg Intravenous Q24H   Infusions:  . sodium chloride     PRN: acetaminophen, acetaminophen, albuterol, alum & mag hydroxide-simeth, loperamide, morphine injection, ondansetron (ZOFRAN) IV, ondansetron, oxyCODONE  Assessment: 89yr morbidly obese female with multiple GI problems, as well as left lower extremity cellulitis.  Patient noted to be volume depleted with decreased renal output.  Est CrCl at present is 25-8935ml/min.  Multiple antibiotic allergies.  Goal of Therapy:  Desire vancomycin serum trough level to be 12-6715mcg/ml  Plan:  1.  Vancomycin 1gm IV q24h 2.  Monitor for indices of infection and renal function 3.  Measure actual steady state serum vancomycin trough  level as clinically indicated  Shirley MuscatRochette, Sears Oran E 03/16/2014,6:16 AM

## 2014-03-16 NOTE — Progress Notes (Signed)
ANTIBIOTIC CONSULT NOTE - follow up  Pharmacy Consult for vancomycin Indication: cellulitis of left lower extremity  Allergies  Allergen Reactions  . Clarithromycin Anaphylaxis  . Levofloxacin Anaphylaxis  . Metronidazole Anaphylaxis  . Sulfonamide Derivatives Anaphylaxis  . Amoxicillin-Pot Clavulanate     Heart palpitations  . Amoxicillin-Pot Clavulanate   . Clindamycin/Lincomycin Diarrhea and Nausea And Vomiting  . Lansoprazole     Unknown   Patient Measurements: Height: 5\' 11"  (180.3 cm) Weight: 290 lb (131.543 kg) IBW/kg (Calculated) : 70.8  Vital Signs: Temp: 98 F (36.7 C) (10/15 0608) Temp Source: Oral (10/15 16100608) BP: 162/72 mmHg (10/15 96040608) Pulse Rate: 94 (10/15 0608) Intake/Output from previous day: 10/14 0701 - 10/15 0700 In: -  Out: 251 [Urine:250; Stool:1] Intake/Output from this shift:    Labs:  Recent Labs  03/15/14 2330  WBC 10.5  HGB 11.9*  PLT 296  CREATININE 1.82*   Estimated Creatinine Clearance: 44.4 ml/min (by C-G formula based on Cr of 1.82). No results found for this basename: VANCOTROUGH, Leodis BinetVANCOPEAK, VANCORANDOM, GENTTROUGH, GENTPEAK, GENTRANDOM, TOBRATROUGH, TOBRAPEAK, TOBRARND, AMIKACINPEAK, AMIKACINTROU, AMIKACIN,  in the last 72 hours   Microbiology: Recent Results (from the past 720 hour(s))  CLOSTRIDIUM DIFFICILE BY PCR     Status: None   Collection Time    03/16/14  2:37 AM      Result Value Ref Range Status   C difficile by pcr NEGATIVE  NEGATIVE Final   Medical History: Past Medical History  Diagnosis Date  . Brain aneurysm   . CHF (congestive heart failure)   . Neuropathy   . Diabetes mellitus   . Anemia   . Thyroid disease   . Arthritis   . Macular degeneration   . Chest pain   . Hypertension   . Hyperlipidemia   . Obesity   . Abnormal stress test     -false positive, prob breast attenuation  . Hx of cardiac catheterization 11/2013    normal coronary arteries  . Sleep apnea     doesn't wear CPAP, sleeps in  a lift chair  . Difficult intubation 02/08/14    re-intubated in PACU   Medications:  Scheduled:  . carvedilol  25 mg Oral BID WC  . ezetimibe  10 mg Oral Daily  . famotidine (PEPCID) IV  20 mg Intravenous Q12H  . folic acid  1 mg Oral Daily  . gabapentin  100 mg Oral TID  . heparin  5,000 Units Subcutaneous 3 times per day  . insulin aspart  0-20 Units Subcutaneous TID WC  . insulin aspart  0-5 Units Subcutaneous QHS  . levothyroxine  112 mcg Oral QAC breakfast  . pravastatin  80 mg Oral QPC supper  . saccharomyces boulardii  250 mg Oral BID  . vancomycin  1,500 mg Intravenous Q24H   Infusions:  . sodium chloride 75 mL/hr at 03/16/14 [id number]   PRN: acetaminophen, acetaminophen, albuterol, alum & mag hydroxide-simeth, loperamide, morphine injection, ondansetron (ZOFRAN) IV, ondansetron, oxyCODONE  Assessment: [age]yr morbidly obese female with multiple GI problems, as well as left lower extremity cellulitis.  Patient noted to be volume depleted with decreased renal output.  SCr is elevated Est CrCl at present is 25-6835ml/min.  Multiple antibiotic allergies.  Goal of Therapy:  Desire vancomycin serum trough level to be 10-415mcg/ml  Plan:  1.  Vancomycin 1500mg  IV q24h 2.  Monitor for indices of infection and renal function 3.  Measure actual steady state serum vancomycin trough level as clinically indicated  [place]denHall,  Osiel Stick A 03/16/2014,7:44 AM

## 2014-03-16 NOTE — Consult Note (Signed)
Referring Provider: Bonnielee Haff, MD Primary Care Physician:  Glo Herring., MD Primary Gastroenterologist:  Garfield Cornea, MD  Reason for Consultation:  diarrhea  HPI: Samantha [name] is a 68 y.o. female with h/o morbid obesity, DM, HTN who presented with upper abdominal pain, nausea, diarrhea since June. June 2015 had a "cold" and also started having some mid to upper abdominal discomfort and nausea, diarrhea. Got over the cold but the GI symptoms continues. Also was having angina. Saw Dr. Gwenlyn Found. Cardiac cath unremarkable. Abd u/s 12/2013, hepatomegaly and diffuse fatty liver. Bilateral intrarenal stones. HIDA GB EF 0%. Underwent cholecystectomy 01/2014. Required 2 day admission due to requirement for reintubation postoperatively. GI symptoms never improved.  Continues to have 3-4 loose stools daily. Prior to June, she had loose stool but not as frequent and not associated with abdominal discomfort. Tried yogurt and imodium. Nothing has helped. She developed toe infection/cellulitis. Saw Dr. Gerarda Fraction 3 days ago. Started clindamycin and doxycycline. Diarrhea worsened. BM 5-6 the last few days. No blood or melena. Mid-abd pain radiates into pelvis. Some heartburn, worse over the past few days on the antibiotics. Came to ER due to progressive weakness. Vomited two days ago. Poor oral intake.   CT A/P without acute findings. Mild anemia, ESR over 60. CDiff PCR negative within past 24 hours. Stool culture pending.   Last TCS ?5 years ago by Dr. Arnoldo Morale.       Prior to Admission medications   Medication Sig Start Date End Date Taking? Authorizing Provider  aspirin 81 MG tablet Take 162 mg by mouth daily.    Yes Historical Provider, MD  carvedilol (COREG) 25 MG tablet Take 1 tablet (25 mg total) by mouth 2 (two) times daily with a meal. 02/15/14  Yes Lorretta Harp, MD  Coenzyme Q10 (CO Q 10) 100 MG CAPS Take 100 mg by mouth 2 (two) times daily.    Yes Historical Provider, MD  doxycycline  (VIBRA-TABS) 100 MG tablet Take 100 mg by mouth 2 (two) times daily. Started on 03/13/14   Yes Historical Provider, MD  enalapril (VASOTEC) 10 MG tablet Take 10 mg by mouth daily.   Yes Historical Provider, MD  folic acid (FOLVITE) 1 MG tablet Take 1 tablet (1 mg total) by mouth daily. 12/20/13  Yes Lorretta Harp, MD  furosemide (LASIX) 40 MG tablet Take 80 mg by mouth daily. 07/04/13  Yes Lorretta Harp, MD  gabapentin (NEURONTIN) 100 MG capsule Take 100 mg by mouth 3 (three) times daily.   Yes Historical Provider, MD  IRON PO Take 365 mg by mouth daily.    Yes Historical Provider, MD  levothyroxine (SYNTHROID, LEVOTHROID) 112 MCG tablet Take 112 mcg by mouth daily before breakfast.   Yes Historical Provider, MD  metFORMIN (GLUCOPHAGE) 500 MG tablet Take 500 mg by mouth 4 (four) times daily.   Yes Historical Provider, MD  niacin 500 MG tablet Take 500 mg by mouth 2 (two) times daily with a meal.   Yes Historical Provider, MD  pravastatin (PRAVACHOL) 80 MG tablet Take 1 tablet (80 mg total) by mouth daily. 11/25/12  Yes Rebecca Eaton, MD  ZETIA 10 MG tablet Take 1 tablet by mouth daily. 11/11/13  Yes Historical Provider, MD  glipiZIDE (GLUCOTROL) 10 MG tablet Take 2.5 mg by mouth daily as needed (for high blood sugar).    Historical Provider, MD  HYDROcodone-acetaminophen (NORCO) 7.5-325 MG per tablet Take 1 tablet by mouth every 6 (six) hours as needed for  moderate pain.  06/28/13   Historical Provider, MD  traMADol (ULTRAM) 50 MG tablet Take 100 mg by mouth 3 (three) times daily. Takes 2 tablets 4 times daily. 02/23/12   Carole Civil, MD    Current Facility-Administered Medications  Medication Dose Route Frequency Provider Last Rate Last Dose  . 0.9 %  sodium chloride infusion   Intravenous Continuous Bonnielee Haff, MD 75 mL/hr at 03/16/14 7672    . acetaminophen (TYLENOL) tablet 650 mg  650 mg Oral Q6H PRN Bonnielee Haff, MD       Or  . acetaminophen (TYLENOL) suppository 650 mg   650 mg Rectal Q6H PRN Bonnielee Haff, MD      . albuterol (PROVENTIL) (2.5 MG/3ML) 0.083% nebulizer solution 2.5 mg  2.5 mg Nebulization Q2H PRN Bonnielee Haff, MD      . alum & mag hydroxide-simeth (MAALOX/MYLANTA) 200-200-20 MG/5ML suspension 30 mL  30 mL Oral Q6H PRN Bonnielee Haff, MD      . carvedilol (COREG) tablet 25 mg  25 mg Oral BID WC Bonnielee Haff, MD      . ezetimibe (ZETIA) tablet 10 mg  10 mg Oral Daily Bonnielee Haff, MD      . famotidine (PEPCID) IVPB 20 mg  20 mg Intravenous Q12H Bonnielee Haff, MD   20 mg at 03/16/14 0947  . folic acid (FOLVITE) tablet 1 mg  1 mg Oral Daily Bonnielee Haff, MD      . gabapentin (NEURONTIN) capsule 100 mg  100 mg Oral TID Bonnielee Haff, MD      . heparin injection 5,000 Units  5,000 Units Subcutaneous 3 times per day Bonnielee Haff, MD   5,000 Units at 03/16/14 [phone]  . insulin aspart (novoLOG) injection 0-20 Units  0-20 Units Subcutaneous TID WC Bonnielee Haff, MD      . insulin aspart (novoLOG) injection 0-5 Units  0-5 Units Subcutaneous QHS Bonnielee Haff, MD      . levothyroxine (SYNTHROID, LEVOTHROID) tablet 112 mcg  112 mcg Oral QAC breakfast Bonnielee Haff, MD      . loperamide (IMODIUM) capsule 4 mg  4 mg Oral TID PRN Bonnielee Haff, MD   4 mg at 03/16/14 8366  . morphine 2 MG/ML injection 1 mg  1 mg Intravenous Q3H PRN Bonnielee Haff, MD      . ondansetron (ZOFRAN) tablet 4 mg  4 mg Oral Q6H PRN Bonnielee Haff, MD       Or  . ondansetron (ZOFRAN) injection 4 mg  4 mg Intravenous Q6H PRN Bonnielee Haff, MD      . oxyCODONE (Oxy IR/ROXICODONE) immediate release tablet 5 mg  5 mg Oral Q4H PRN Bonnielee Haff, MD   5 mg at 03/16/14 2947  . pravastatin (PRAVACHOL) tablet 80 mg  80 mg Oral QPC supper Bonnielee Haff, MD      . saccharomyces boulardii (FLORASTOR) capsule 250 mg  250 mg Oral BID Bonnielee Haff, MD      . vancomycin (VANCOCIN) 1,500 mg in sodium chloride 0.9 % 500 mL IVPB  1,500 mg Intravenous Q24H Bonnielee Haff, MD         Allergies as of 03/15/2014 - Review Complete 03/15/2014  Allergen Reaction Noted  . Clarithromycin Anaphylaxis   . Levofloxacin Anaphylaxis   . Metronidazole Anaphylaxis   . Sulfonamide derivatives Anaphylaxis   . Amoxicillin-pot clavulanate    . Amoxicillin-pot clavulanate  09/17/2010  . Clindamycin/lincomycin Diarrhea and Nausea And Vomiting 03/15/2014  . Lansoprazole      Past  Medical History  Diagnosis Date  . Brain aneurysm   . CHF (congestive heart failure)   . Neuropathy   . Diabetes mellitus   . Anemia   . Thyroid disease   . Arthritis   . Macular degeneration   . Chest pain   . Hypertension   . Hyperlipidemia   . Obesity   . Abnormal stress test     -false positive, prob breast attenuation  . Hx of cardiac catheterization 11/2013    normal coronary arteries  . Sleep apnea     doesn't wear CPAP, sleeps in a lift chair  . Difficult intubation 02/08/14    re-intubated in PACU    Past Surgical History  Procedure Laterality Date  . Tonsillectomy    . Appendectomy    . Lower back    . Heel spurs    . Hammer toes    . Right knee sark    . Cerebral aneurysm repair    . Cardiac catheterization  12/22/13    done for "angina" and abnormal nuc study- normal coronary arteries  . Cardiac catheterization  2004    insignificant CAD  . Cholecystectomy N/A 02/08/2014    Procedure: LAPAROSCOPIC CHOLECYSTECTOMY;  Surgeon: Jamesetta So, MD;  Location: [facility];  Service: General;  Laterality: N/A;  . Esophagogastroduodenoscopy  2007    Dr. Gala Romney: mottled patchy erythema of stomach, small bowel bx negative    Family History  Problem Relation Age of Onset  . Diabetes      family history   . Arthritis      family history   . Diabetes Mother   . Hypertension Mother   . Seizures Sister   . Colon cancer Neg Hx     History   Social History  . Marital Status: Married    Spouse Name: N/A    Number of Children: N/A  . Years of Education: 12   Occupational History   . Not on file.   Social History Main Topics  . Smoking status: Former Smoker    Quit date: 07/17/2002  . Smokeless tobacco: Not on file  . Alcohol Use: No  . Drug Use: No  . Sexual Activity: Not on file   Other Topics Concern  . Not on file   Social History Narrative  . No narrative on file     ROS:  General: Negative for fever, chills. See hpi. Has had difficult ambulating see 01/2014 surgery. Feels like needs PT. Eyes: Negative for vision changes.  ENT: Negative for hoarseness, difficulty swallowing , nasal congestion. CV: Negative for chest pain, angina, palpitations, dyspnea on exertion, peripheral edema.  Respiratory: Negative for dyspnea at rest, dyspnea on exertion, cough, sputum, wheezing.  GI: See history of present illness. GU:  Negative for dysuria, hematuria, urinary incontinence, urinary frequency, nocturnal urination.  MS: Negative for joint pain, low back pain. Right great toe pain. Derm: Negative for rash or itching. Open sore on right anterior lower leg. Neuro: Negative for seizure, frequent headaches, memory loss, confusion. Chronic right lower ext weakness. Psych: Negative for anxiety, depression, suicidal ideation, hallucinations.  Endo: Negative for unusual weight change.  Heme: Negative for bruising or bleeding. Allergy: Negative for rash or hives.       Physical Examination: Vital signs in last 24 hours: Temp:  [98 F (36.7 C)-98.2 F (36.8 C)] 98 F (36.7 C) (10/15 0347) Pulse Rate:  [74-94] 94 (10/15 0608) Resp:  [18-20] 18 (10/15 0608) BP: (102-162)/(48-89) 162/72 mmHg (  10/15 0608) SpO2:  [91 %-98 %] 93 % (10/15 6808) Weight:  [290 lb (131.543 kg)] 290 lb (131.543 kg) (10/14 2102)    General: morbidly obese, chronically ill-appearing WF in no acute distress.  Head: Normocephalic, atraumatic.   Eyes: Conjunctiva pink, no icterus. Mouth: Oropharyngeal mucosa moist and pink , no lesions erythema or exudate. Neck: Supple without thyromegaly,  masses, or lymphadenopathy.  Lungs: Clear to auscultation bilaterally.  Heart: Regular rate and rhythm, no murmurs rubs or gallops.  Abdomen: Bowel sounds are normal, mild diffuse abdominal tenderness, nondistended, no hepatosplenomegaly or masses, no abdominal bruits or    hernia , no rebound or guarding.   Rectal: not performed Extremities: No lower extremity edema, clubbing, deformity. Superficial sore on right anterior lower ext. Erythema and serosangeneous  drainage.  Neuro: Alert and oriented x 4 , grossly normal neurologically.  Skin: Warm and dry, no rash or jaundice.   Psych: Alert and cooperative, normal mood and affect.        Intake/Output from previous day: 10/14 0701 - 10/15 0700 In: -  Out: 251 [Urine:250; Stool:1] Intake/Output this shift:    Lab Results: CBC  Recent Labs  03/15/14 2330  WBC 10.5  HGB 11.9*  HCT 35.8*  MCV 87.3  PLT 296   BMET  Recent Labs  03/15/14 2330  NA 140  K 3.7  CL 95*  CO2 29  GLUCOSE 139*  BUN 21  CREATININE 1.82*  CALCIUM 9.8   LFT  Recent Labs  03/15/14 2330  BILITOT 0.3  ALKPHOS 75  AST 13  ALT 10  PROT 7.7  ALBUMIN 3.5     Erythrocyte Sedimentation Rate     Component Value Date/Time   ESRSEDRATE 68* 03/15/2014 2330     Lipase No results found for this basename: LIPASE,  in the last 72 hours  PT/INR No results found for this basename: LABPROT, INR,  in the last 72 hours    Imaging Studies: Ct Abdomen Pelvis Wo Contrast  03/16/2014   CLINICAL DATA:  Diarrhea and upper abdominal pain. Initial encounter.  EXAM: CT ABDOMEN AND PELVIS WITHOUT CONTRAST  TECHNIQUE: Multidetector CT imaging of the abdomen and pelvis was performed following the standard protocol without IV contrast.  COMPARISON:  None currently available  FINDINGS: BODY WALL: Coarse calcification noted in the inferior left breast, partially visualized.  There is a fatty periumbilical hernia.  LOWER CHEST: Diffuse coronary atherosclerosis.   ABDOMEN/PELVIS:  Liver: No focal abnormality.  Biliary: Cholecystectomy. Minimal tissue in the gallbladder fossa is likely scarring. No biliary ductal dilatation.  Pancreas: Unremarkable.  Spleen: Unremarkable.  Adrenals: 2.7 cm right adrenal mass with Hounsfield units consistent with adenoma. This is a chronic mass based on report from 09/29/2002.  Kidneys and ureters: Bilateral nephrolithiasis, nonobstructive. The largest stone on the left is in the lower pole and measures 11 mm. The largest on the right is in the lower pole at 14 mm.  Bladder: Unremarkable.  Reproductive: Unremarkable.  Bowel: No obstruction. Appendectomy.  Retroperitoneum: No mass or adenopathy.  Peritoneum: No ascites or pneumoperitoneum.  Vascular: No acute abnormality.  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  IMPRESSION: 1. No acute intra-abdominal findings. 2. Extensive bilateral nephrolithiasis. 3. Right adrenal adenoma.   Electronically Signed   By: Jorje Guild M.D.   On: 03/16/2014 02:31  [4 week]   Impression: 68 y/o female with several month history of vague diffuse abdominal pain associated  with nausea, diarrhea. Cholecystectomy done for GB EF 0% on HIDA but did not help symptoms. Recent cardiac cath unremarkable. Chronically has had loose stools but not usually associated with abdominal pain and nausea. Recent onset GERD. Patient underwent SB bx 2007 to rule out celiac disease.   Current CT did not reveal significant abnormalities. ESR elevated over 60. Cdiff PCR negative.   Acute on chronic diarrhea of unclear etiology. At least some related to antibiotics. May require endoscopic evaluation.  Plan: 1. Await stool culture. Supportive measures.  2. To discuss with Dr. Oneida Alar. Depending on clinical course, patient may require colonoscopy +/- EGD for further evaluation.  3. Agree with florastor/vanc.   We would like to thank you for the opportunity to  participate in the care of Samantha [name].    LOS: 1 day   Neil Crouch  03/16/2014, 8:46 AM

## 2014-03-16 NOTE — Clinical Social Work Note (Signed)
CSW attempted twice to see pt to discuss SNF. Pt requests for CSW to return tomorrow morning.   Derenda FennelKara Jonhatan Hearty, Kentucky[place]CS[place] [phone]

## 2014-03-16 NOTE — H&P (Signed)
[facility] History and Physical  Samantha [name] GMW:[id number] DOB: 27-Aug-1945 DOA: 03/15/2014   PCP: Glo Herring., MD  Specialists: Dr. Arnoldo Morale is her general surgeon  Chief Complaint: Nausea, diarrhea, and abdominal pain  HPI: Samantha [name] is a 68 y.o. female with the past medical history of morbid obesity, diabetes mellitus, type II, hypertension, who has been having nausea, Upper abdominal pain, and diarrhea since June. She also had some chest pain at that time, for which she was referred to cardiology. She underwent cardiac catheterization, which did not show any significant stenosis. She underwent HIDA scan which revealed biliary dyskinesia. She underwent cholecystectomy. However, her symptoms did not improve. Diarrhea persisted. She was having 2-3 loose stools every day without any blood. She was started on clindamycin and doxycycline a few days ago, for cellulitis of her left leg. And as a result her diarrhea got worse. She's had persistent nausea with upper abdominal pain, and heartburns. She's lost about 10-15 pounds in the last few months. She reports having had a colonoscopy 5 years ago, which apparently, was normal. She's become more and more weak. And, hence, she decided to come back to the hospital. Abdominal pain is 10/10, constant, dull, aching pain in the upper abdomen without any radiation.  Home Medications: Prior to Admission medications   Medication Sig Start Date End Date Taking? Authorizing Provider  aspirin 81 MG tablet Take 162 mg by mouth daily.    Yes Historical Provider, MD  carvedilol (COREG) 25 MG tablet Take 1 tablet (25 mg total) by mouth 2 (two) times daily with a meal. 02/15/14  Yes Lorretta Harp, MD  Coenzyme Q10 (CO Q 10) 100 MG CAPS Take 100 mg by mouth 2 (two) times daily.    Yes Historical Provider, MD  doxycycline (VIBRA-TABS) 100 MG tablet Take 100 mg by mouth 2 (two) times daily. Started on 03/13/14   Yes Historical Provider, MD    enalapril (VASOTEC) 10 MG tablet Take 10 mg by mouth daily.   Yes Historical Provider, MD  folic acid (FOLVITE) 1 MG tablet Take 1 tablet (1 mg total) by mouth daily. 12/20/13  Yes Lorretta Harp, MD  furosemide (LASIX) 40 MG tablet Take 80 mg by mouth daily. 07/04/13  Yes Lorretta Harp, MD  gabapentin (NEURONTIN) 100 MG capsule Take 100 mg by mouth 3 (three) times daily.   Yes Historical Provider, MD  IRON PO Take 365 mg by mouth daily.    Yes Historical Provider, MD  levothyroxine (SYNTHROID, LEVOTHROID) 112 MCG tablet Take 112 mcg by mouth daily before breakfast.   Yes Historical Provider, MD  metFORMIN (GLUCOPHAGE) 500 MG tablet Take 500 mg by mouth 4 (four) times daily.   Yes Historical Provider, MD  niacin 500 MG tablet Take 500 mg by mouth 2 (two) times daily with a meal.   Yes Historical Provider, MD  pravastatin (PRAVACHOL) 80 MG tablet Take 1 tablet (80 mg total) by mouth daily. 11/25/12  Yes Rebecca Eaton, MD  ZETIA 10 MG tablet Take 1 tablet by mouth daily. 11/11/13  Yes Historical Provider, MD  glipiZIDE (GLUCOTROL) 10 MG tablet Take 2.5 mg by mouth daily as needed (for high blood sugar).    Historical Provider, MD  HYDROcodone-acetaminophen (NORCO) 7.5-325 MG per tablet Take 1 tablet by mouth every 6 (six) hours as needed for moderate pain.  06/28/13   Historical Provider, MD  traMADol (ULTRAM) 50 MG tablet Take 100 mg by mouth 3 (three) times daily.  Takes 2 tablets 4 times daily. 02/23/12   Carole Civil, MD    Allergies:  Allergies  Allergen Reactions  . Clarithromycin Anaphylaxis  . Levofloxacin Anaphylaxis  . Metronidazole Anaphylaxis  . Sulfonamide Derivatives Anaphylaxis  . Amoxicillin-Pot Clavulanate     Heart palpitations  . Amoxicillin-Pot Clavulanate   . Clindamycin/Lincomycin Diarrhea and Nausea And Vomiting  . Lansoprazole     Unknown    Past Medical History: Past Medical History  Diagnosis Date  . Brain aneurysm   . CHF (congestive heart  failure)   . Neuropathy   . Diabetes mellitus   . Anemia   . Thyroid disease   . Arthritis   . Macular degeneration   . Chest pain   . Hypertension   . Hyperlipidemia   . Obesity   . Abnormal stress test     -false positive, prob breast attenuation  . Hx of cardiac catheterization 11/2013    normal coronary arteries  . Sleep apnea     doesn't wear CPAP, sleeps in a lift chair  . Difficult intubation 02/08/14    re-intubated in PACU    Past Surgical History  Procedure Laterality Date  . Tonsillectomy    . Appendectomy    . Lower back    . Heel spurs    . Hammer toes    . Right knee sark    . Cerebral aneurysm repair    . Cardiac catheterization  12/22/13    done for "angina" and abnormal nuc study- normal coronary arteries  . Cardiac catheterization  2004    insignificant CAD  . Cholecystectomy N/A 02/08/2014    Procedure: LAPAROSCOPIC CHOLECYSTECTOMY;  Surgeon: Jamesetta So, MD;  Location: [facility];  Service: General;  Laterality: N/A;    Social History: Lives in [place] with her husband. No smoking, alcohol use or illicit drug use.  Family History:  Family History  Problem Relation Age of Onset  . Diabetes      family history   . Arthritis      family history   . Diabetes Mother   . Hypertension Mother   . Seizures Sister      Review of Systems - History obtained from the patient General ROS: positive for  - fatigue Psychological ROS: positive for - anxiety Ophthalmic ROS: negative ENT ROS: negative Allergy and Immunology ROS: negative Hematological and Lymphatic ROS: negative Endocrine ROS: negative Respiratory ROS: no cough, shortness of breath, or wheezing Cardiovascular ROS: no chest pain or dyspnea on exertion Gastrointestinal ROS: as in hpi Genito-Urinary ROS: no dysuria, trouble voiding, or hematuria Musculoskeletal ROS: negative Neurological ROS: no TIA or stroke symptoms Dermatological ROS: negative  Physical Examination  Filed Vitals:    03/16/14 0000 03/16/14 0030 03/16/14 0122 03/16/14 0130  BP: 108/48 118/54 113/65 109/53  Pulse:   80   Temp:      TempSrc:      Resp:      Height:      Weight:      SpO2:   94% 94%    BP 109/53  Pulse 80  Temp(Src) 98.2 F (36.8 C) (Oral)  Resp 20  Ht 5' 11" (1.803 m)  Wt 131.543 kg (290 lb)  BMI 40.46 kg/m2  SpO2 94%  General appearance: alert, cooperative, appears stated age, no distress and morbidly obese Head: Normocephalic, without obvious abnormality, atraumatic Eyes: conjunctivae/corneas clear. PERRL, EOM's intact. Throat: lips, mucosa, and tongue normal; teeth and gums normal Neck: no adenopathy, no  carotid bruit, no JVD, supple, symmetrical, trachea midline and thyroid not enlarged, symmetric, no tenderness/mass/nodules Resp: clear to auscultation bilaterally Cardio: regular rate and rhythm, S1, S2 normal, no murmur, click, rub or gallop GI: soft, non-tender; bowel sounds normal; no masses,  no organomegaly Extremities: Now with wound in the left lower extremity. Warm to touch. No tenderness. Pulses: 2+ and symmetric Skin: Erythema noted in the left leg Neurologic: Alert and oriented x3. She is noted to have right foot drop. No other focal deficits noted.  Laboratory Data: Results for orders placed during the hospital encounter of 03/15/14 (from the past 48 hour(s))  CBC WITH DIFFERENTIAL     Status: Abnormal   Collection Time    03/15/14 11:30 PM      Result Value Ref Range   WBC 10.5  4.0 - 10.5 K/uL   Comment: RESULT REPEATED AND VERIFIED   RBC 4.10  3.87 - 5.11 MIL/uL   Hemoglobin 11.9 (*) 12.0 - 15.0 g/dL   HCT 35.8 (*) 36.0 - 46.0 %   MCV 87.3  78.0 - 100.0 fL   MCH 29.0  26.0 - 34.0 pg   MCHC 33.2  30.0 - 36.0 g/dL   RDW 14.5  11.5 - 15.5 %   Platelets 296  150 - 400 K/uL   Neutrophils Relative % 74  43 - 77 %   Lymphocytes Relative 17  12 - 46 %   Monocytes Relative 7  3 - 12 %   Eosinophils Relative 2  0 - 5 %   Basophils Relative 0  0 - 1 %    Neutro Abs 7.8 (*) 1.7 - 7.7 K/uL   Lymphs Abs 1.8  0.7 - 4.0 K/uL   Monocytes Absolute 0.7  0.1 - 1.0 K/uL   Eosinophils Absolute 0.2  0.0 - 0.7 K/uL   Basophils Absolute 0.0  0.0 - 0.1 K/uL   WBC Morphology TOXIC GRANULATION     Comment: VACUOLATED NEUTROPHILS     WHITE COUNT CONFIRMED ON SMEAR  COMPREHENSIVE METABOLIC PANEL     Status: Abnormal   Collection Time    03/15/14 11:30 PM      Result Value Ref Range   Sodium 140  137 - 147 mEq/L   Potassium 3.7  3.7 - 5.3 mEq/L   Chloride 95 (*) 96 - 112 mEq/L   CO2 29  19 - 32 mEq/L   Glucose, Bld 139 (*) 70 - 99 mg/dL   BUN 21  6 - 23 mg/dL   Creatinine, Ser 1.82 (*) 0.50 - 1.10 mg/dL   Calcium 9.8  8.4 - 10.5 mg/dL   Total Protein 7.7  6.0 - 8.3 g/dL   Albumin 3.5  3.5 - 5.2 g/dL   AST 13  0 - 37 U/L   ALT 10  0 - 35 U/L   Alkaline Phosphatase 75  39 - 117 U/L   Total Bilirubin 0.3  0.3 - 1.2 mg/dL   GFR calc non Af Amer 27 (*) >90 mL/min   GFR calc Af Amer 32 (*) >90 mL/min   Comment: (NOTE)     The eGFR has been calculated using the CKD EPI equation.     This calculation has not been validated in all clinical situations.     eGFR's persistently <90 mL/min signify possible Chronic Kidney     Disease.   Anion gap 16 (*) 5 - 15  SEDIMENTATION RATE     Status: Abnormal   Collection Time  03/15/14 11:30 PM      Result Value Ref Range   Sed Rate 68 (*) 0 - 22 mm/hr  URINALYSIS, ROUTINE W REFLEX MICROSCOPIC     Status: Abnormal   Collection Time    03/15/14 11:44 PM      Result Value Ref Range   Color, Urine STRAW (*) YELLOW   APPearance CLEAR  CLEAR   Specific Gravity, Urine <1.005 (*) 1.005 - 1.030   pH 5.5  5.0 - 8.0   Glucose, UA NEGATIVE  NEGATIVE mg/dL   Hgb urine dipstick NEGATIVE  NEGATIVE   Bilirubin Urine NEGATIVE  NEGATIVE   Ketones, ur NEGATIVE  NEGATIVE mg/dL   Protein, ur NEGATIVE  NEGATIVE mg/dL   Urobilinogen, UA 0.2  0.0 - 1.0 mg/dL   Nitrite NEGATIVE  NEGATIVE   Leukocytes, UA TRACE (*) NEGATIVE   URINE MICROSCOPIC-ADD ON     Status: Abnormal   Collection Time    03/15/14 11:44 PM      Result Value Ref Range   Squamous Epithelial / LPF MANY (*) RARE   WBC, UA 0-2  <3 WBC/hpf   RBC / HPF 0-2  <3 RBC/hpf   Bacteria, UA FEW (*) RARE  CLOSTRIDIUM DIFFICILE BY PCR     Status: None   Collection Time    03/16/14  2:37 AM      Result Value Ref Range   C difficile by pcr NEGATIVE  NEGATIVE    Radiology Reports: Ct Abdomen Pelvis Wo Contrast  03/16/2014   CLINICAL DATA:  Diarrhea and upper abdominal pain. Initial encounter.  EXAM: CT ABDOMEN AND PELVIS WITHOUT CONTRAST  TECHNIQUE: Multidetector CT imaging of the abdomen and pelvis was performed following the standard protocol without IV contrast.  COMPARISON:  None currently available  FINDINGS: BODY WALL: Coarse calcification noted in the inferior left breast, partially visualized.  There is a fatty periumbilical hernia.  LOWER CHEST: Diffuse coronary atherosclerosis.  ABDOMEN/PELVIS:  Liver: No focal abnormality.  Biliary: Cholecystectomy. Minimal tissue in the gallbladder fossa is likely scarring. No biliary ductal dilatation.  Pancreas: Unremarkable.  Spleen: Unremarkable.  Adrenals: 2.7 cm right adrenal mass with Hounsfield units consistent with adenoma. This is a chronic mass based on report from 09/29/2002.  Kidneys and ureters: Bilateral nephrolithiasis, nonobstructive. The largest stone on the left is in the lower pole and measures 11 mm. The largest on the right is in the lower pole at 14 mm.  Bladder: Unremarkable.  Reproductive: Unremarkable.  Bowel: No obstruction. Appendectomy.  Retroperitoneum: No mass or adenopathy.  Peritoneum: No ascites or pneumoperitoneum.  Vascular: No acute abnormality.  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  IMPRESSION: 1. No acute intra-abdominal findings. 2. Extensive bilateral nephrolithiasis. 3. Right adrenal adenoma.   Electronically  Signed   By: Jorje Guild M.D.   On: 03/16/2014 02:31     Problem List  Principal Problem:   ARF (acute renal failure) Active Problems:   Essential hypertension   Morbid obesity   Abdominal pain   Chronic diarrhea   Nausea   DM type 2 (diabetes mellitus, type 2)   Cellulitis of left leg   Assessment: This is a 68 year old, Caucasian female, presents with persistent diarrhea, nausea, and upper abdominal pain. She also has cellulitis of her left lower extremity. She also has acute renal failure. She has a contaminated urine sample  Plan: #1 acute renal failure: Most likely due to volume loss from  diarrhea. We will give her IV fluids. We'll hold her nephrotoxic agents , including Lasix, and ACE inhibitor. Repeat renal function in the morning.  #2 left lower extremity cellulitis: Treated with intravenous vancomycin for now. She's allergic to multiple antibiotics. She can be transitioned back to doxycycline once the infection starts improving. She has a wound in the left leg, but there is no active drainage noted.  #3 chronic diarrhea with nausea and abdominal pain: This all started back in June. Cholecystectomy hasn't helped her symptoms. Etiology remains unclear. Nausea could be due to diabetic gastroparesis. She's never had upper endoscopy. We will place on Pepcid. She might benefit from GI input. So, we will consult gastroenterology. Imodium as needed. C. difficile is negative.  #4 right foot drop: This is been ongoing for the past many months. She was supposed to be referred to neurology, but she hasn't had the time to followup. Will defer this to her PCP.  #5 diabetes mellitus, type II: Initiate sliding scale coverage. Check HbA1c.  #6 history of hypertension: Monitor blood pressure. Hydralazine as needed.   DVT Prophylaxis: Heparin Code Status: Full code Family Communication: Discussed with patient and husband  Disposition Plan: Admit to MedSurg   Further management  decisions will depend on results of further testing and patient's response to treatment.   [facility]  [facility] Pager [phone]  If 7PM-7AM, please contact night-coverage [email or web address] Password [email or web address]  03/16/2014, 5:00 AM

## 2014-03-16 NOTE — Care Management Utilization Note (Signed)
UR completed 

## 2014-03-16 NOTE — Progress Notes (Signed)
OT Cancellation Note  Patient Details Name: Samantha [name] MRN: [id number] DOB: 1945/08/25   Cancelled Treatment:    Reason Eval/Treat Not Completed: Patient at procedure or test/ unavailable. Pt receiving bath at this time.  Will re attempt OT eval at later time/date.Marry Guan.  Hoke Baer Rawlings Kyaira Trantham, MS, OTR/L [facility] [phone]  03/16/2014, 9:24 AM

## 2014-03-16 NOTE — Evaluation (Signed)
Occupational Therapy Evaluation Patient Details Name: Samantha [name] MRN: [id number] DOB: May 23, 1946 Today's Date: 03/16/2014    History of Present Illness Samantha [name] is a 68 y.o. female with the past medical history of morbid obesity, diabetes mellitus, type II, hypertension, who has been having nausea, Upper abdominal pain, and diarrhea since June. She also had some chest pain at that time, for which she was referred to cardiology. She underwent cardiac catheterization, which did not show any significant stenosis. She underwent HIDA scan which revealed biliary dyskinesia. She underwent cholecystectomy. However, her symptoms did not improve. Diarrhea persisted. She was having 2-3 loose stools every day without any blood. She was started on clindamycin and doxycycline a few days ago, for cellulitis of her left leg. And as a result her diarrhea got worse. She's had persistent nausea with upper abdominal pain, and heartburns. She's lost about 10-15 pounds in the last few months. She reports having had a colonoscopy 5 years ago, which apparently, was normal. She's become more and more weak. And, hence, she decided to come back to the hospital. Abdominal pain is 10/10, constant, dull, aching pain in the upper abdomen without any radiation.   Clinical Impression   Pt is presenting to acute OT with above situation.  She has WFL strength and ROM in BUE.  Pt reports she has been completing an HEP to maintain her arm strength.  Encouraged pt to continue with HEP.  Pt has overall decreased ADL status since recent surgery in September.  She has has increased difficulty with standing/amulation, limiting her engagement in ADL tasks.  Pt will benefit from continued OT services to promote increased independence in ADLs.  Recommend SNF OT services after resolution of abdominal pain.    Follow Up Recommendations  SNF    Equipment Recommendations  Other (comment) (Defer to SNF)    Recommendations for Other  Services       Precautions / Restrictions Precautions Precautions: Fall Restrictions Weight Bearing Restrictions: No      Mobility Bed Mobility Overal bed mobility: Needs Assistance Bed Mobility: Sidelying to Sit;Supine to Sit   Sidelying to sit: HOB elevated;Supervision (HOB elevated (like lift chair)) Supine to sit: Mod assist (Mod assist for LE to get into bed and position in bed)        Transfers Overall transfer level: Needs assistance Equipment used: Rolling walker (2 wheeled) Transfers: Sit to/from Stand Sit to Stand: From elevated surface;Supervision;Min guard         General transfer comment: Pt required increased attempts and use of momentum to transfer to standng.     Balance Overall balance assessment: Needs assistance Sitting-balance support: Feet supported;No upper extremity supported Sitting balance-Leahy Scale: Good     Standing balance support: Bilateral upper extremity supported;During functional activity Standing balance-Leahy Scale: Fair                              ADL Overall ADL's : Needs assistance/impaired Eating/Feeding: Set up   Grooming: Set up                 Lower Body Dressing Details (indicate cue type and reason): Did not attempt due to increased pain.  Likely mod-max assist requried.   Toilet Transfer Details (indicate cue type and reason): did not attempt due to increased pain.  Likely max assist requried.                 Vision  Perception     Praxis      Pertinent Vitals/Pain Pain Assessment: 0-10 Pain Score: 10-Worst pain ever Pain Location: Abdomen Pain Descriptors / Indicators: Aching Pain Intervention(s): Limited activity within patient's tolerance;Monitored during session;Premedicated before session     Hand Dominance Right   Extremity/Trunk Assessment Upper Extremity Assessment Upper Extremity Assessment: Overall WFL for tasks assessed   Lower  Extremity Assessment Lower Extremity Assessment: Defer to PT evaluation RLE Deficits / Details: Foot drop Rt ankle.  MMT Rt hip 3/5, knee 3+/5 LLE Deficits / Details: MMT Lt hip 3/5, knee 3+/5, ankle 3+/5       Communication Communication Communication: No difficulties   Cognition Arousal/Alertness: Awake/alert Behavior During Therapy: WFL for tasks assessed/performed Overall Cognitive Status: Within Functional Limits for tasks assessed                     General Comments       Exercises       Shoulder Instructions      Home Living Family/patient expects to be discharged to:: Private residence Living Arrangements: Spouse/significant other Available Help at Discharge: Family;Available 24 hours/day Type of Home: House Home Access: Level entry     Home Layout: Laundry or work area in basement;Able to live on main level with bedroom/bathroom     Bathroom Shower/Tub:  (sponge bathes)   Bathroom Toilet:  (currently uses BSC)     Home Equipment: Bedside commode;Walker - 2 wheels (lift chair)          Prior Functioning/Environment Level of Independence: Needs assistance;Independent with assistive device(s)  Gait / Transfers Assistance Needed: Pt reports she is mod (I) [facility].  Pt reports she was last able to amb in the home with RW September before her gallbladder procedure.  ADL's / Homemaking Assistance Needed: Prior to recent surgery in September pt was able to stand at sink/counter top for grooming and simple kitchen prep tasks.  Recenlty, pt hads been feeding, dressing, and sponge bathing wtih set-up assist.        OT Diagnosis: Other (comment) (Decreased ADL status)   OT Problem List: Decreased knowledge of use of DME or AE;Decreased activity tolerance;Other (comment);Decreased strength (Decreased ADL status)   OT Treatment/Interventions: Self-care/ADL training;Therapeutic activities;DME and/or AE  instruction;Patient/family education;Energy conservation;Therapeutic exercise    OT Goals(Current goals can be found in the care plan section) Acute Rehab OT Goals Patient Stated Goal: "I want to walk" OT Goal Formulation: With patient Time For Goal Achievement: 03/30/14 Potential to Achieve Goals: Good ADL Goals Pt Will Perform Lower Body Dressing: with min assist Pt Will Transfer to Toilet: with mod assist Pt Will Perform Toileting - Clothing Manipulation and hygiene: with min assist  OT Frequency: Min 2X/week   Barriers to D/C:    Pt will have increased difficulty engaging in therapy sessions until pain in abdomen is resolved.  However, pt is highly motivated to regain strength and former ADL status.       Co-evaluation              End of Session    Activity Tolerance: Patient limited by pain Patient left: in bed;with call bell/phone within reach;with bed alarm set   Time: [id number] OT Time Calculation (min): 17 min Charges:  OT General Charges $OT Visit: 1 Procedure OT Evaluation $Initial OT Evaluation Tier I: 1 Procedure G-Codes:     Samantha GuanMarie Rawlings Keondria Siever, MS, OTR/L [facility]  [phone] 03/16/2014, 12:12 PM

## 2014-03-17 ENCOUNTER — Ambulatory Visit (HOSPITAL_COMMUNITY): Admission: RE | Admit: 2014-03-17 | Payer: Medicare HMO | Source: Ambulatory Visit

## 2014-03-17 LAB — GLUCOSE, CAPILLARY
Glucose-Capillary: 135 mg/dL — ABNORMAL HIGH (ref 70–99)
Glucose-Capillary: 165 mg/dL — ABNORMAL HIGH (ref 70–99)
Glucose-Capillary: 120 mg/dL — ABNORMAL HIGH (ref 70–99)

## 2014-03-17 LAB — COMPREHENSIVE METABOLIC PANEL
ALBUMIN: 2.9 g/dL — AB (ref 3.5–5.2)
ALT: 7 U/L (ref 0–35)
ANION GAP: 11 (ref 5–15)
AST: 13 U/L (ref 0–37)
Alkaline Phosphatase: 62 U/L (ref 39–117)
BUN: 13 mg/dL (ref 6–23)
CALCIUM: 9.1 mg/dL (ref 8.4–10.5)
CO2: 28 mEq/L (ref 19–32)
CREATININE: 1.52 mg/dL — AB (ref 0.50–1.10)
Chloride: 107 mEq/L (ref 96–112)
GFR calc Af Amer: 40 mL/min — ABNORMAL LOW (ref >90)
GFR calc non Af Amer: 34 mL/min — ABNORMAL LOW (ref >90)
Glucose, Bld: 126 mg/dL — ABNORMAL HIGH (ref 70–99)
Potassium: 3.3 mEq/L — ABNORMAL LOW (ref 3.7–5.3)
Sodium: 146 mEq/L (ref 137–147)
TOTAL PROTEIN: 6.5 g/dL (ref 6.0–8.3)
Total Bilirubin: 0.3 mg/dL (ref 0.3–1.2)

## 2014-03-17 LAB — CBC
HCT: 34.3 % — ABNORMAL LOW (ref 36.0–46.0)
HEMOGLOBIN: 11.1 g/dL — AB (ref 12.0–15.0)
MCH: 28.6 pg (ref 26.0–34.0)
MCHC: 32.4 g/dL (ref 30.0–36.0)
MCV: 88.4 fL (ref 78.0–100.0)
Platelets: 265 10*3/uL (ref 150–400)
RBC: 3.88 MIL/uL (ref 3.87–5.11)
RDW: 14.8 % (ref 11.5–15.5)
WBC: 7.8 10*3/uL (ref 4.0–10.5)

## 2014-03-17 LAB — TSH: TSH: 1 u[IU]/mL (ref 0.350–4.500)

## 2014-03-17 MED ORDER — COLESTIPOL HCL 1 G PO TABS
2.0000 g | ORAL_TABLET | Freq: Two times a day (BID) | ORAL | Status: DC
  Administered 2014-03-17 – 2014-03-18 (×2): 2 g via ORAL
  Filled 2014-03-17 (×6): qty 2

## 2014-03-17 MED ORDER — POTASSIUM CHLORIDE CRYS ER 20 MEQ PO TBCR
40.0000 meq | EXTENDED_RELEASE_TABLET | Freq: Once | ORAL | Status: AC
  Administered 2014-03-17: 40 meq via ORAL
  Filled 2014-03-17: qty 2

## 2014-03-17 MED ORDER — DICYCLOMINE HCL 10 MG PO CAPS
10.0000 mg | ORAL_CAPSULE | Freq: Two times a day (BID) | ORAL | Status: DC
  Administered 2014-03-17 – 2014-03-18 (×2): 10 mg via ORAL
  Filled 2014-03-17 (×2): qty 1

## 2014-03-17 NOTE — Progress Notes (Addendum)
Patient ID: Samantha RubensteinBarbara A [name], female   DOB: [birth year], 68 y.o.   MRN: [id number]  Assessment/Plan: ADMITTED WITH DIARRHEA AND UNINTENTIONAL WEIGHT LOSS. DIARRHEA MOST LIKELY DUE TO BILE SALT INDUCED DIARRHEA, DOXYCYCLINE, AND FUNCTIONAL DIARRHEA, LESS LIKELY MICROSCOPIC COLITIS. UNINTENTIONAL WEIGHT LOSS MOST LIKELY DUE TO CO-MORBID CONDITIONS LESS LIKELY DUE TO DIARRHEA.  STOOL Cx PENDING BUT NEGATIVE AND C DIFF PCR NEG DOUBT INFECTIOUS ETIOLOGY FOR DIARRHEA.  Plan: 1. Advance to DAIRY free/low fat/DIABETIC diet 2. ADD BENTYL BID 3. ADD COLESTID BID 4. COMPLETE DOXYCYLINE FOR CELLULITIS 5. CONTINUE PROBIOTIC. 6. OPV IN 4-6 WEEKS WITH DR. Jena GaussOURK TO ASSESS BENEFITS V. RISKS OF TCS FOR DIARRHEA/RECTAL BLEEDING.   Subjective: Since I last evaluated the patient PT C/O CRAMPY ABDOMINAL PAIN AND INCREASED LOOSE STOOLS TODAY. C DIFF PCR NEG OCT 15 CONTINUES ON DOXYCYCLINE WITH IMPROVEMENT IN CELLULITIS. NO NAUSEA OR VOMITING, FEVER OR CHILLS. SMALL AMOUNT OF BLOOD SEEN IN WATERY STOOL TODAY. STOOL CX PENDING.  Objective: Vital signs in last 24 hours: Filed Vitals:   03/17/14 1421  BP: 153/74  Pulse: 82  Temp: 98.2 F (36.8 C)  Resp: 18     General appearance: alert, cooperative and no distress Resp: clear to auscultation bilaterally Cardio: regular rate and rhythm GI: soft, non-tender; bowel sounds normal; no masses,  no organomegaly Extremities: edema 1+UNABLE TO APPRECIATE ERYTHEMA IN BIL LEs.  Lab Results:     Studies/Results: Ct Abdomen Pelvis Wo Contrast  03/16/2014   CLINICAL DATA:  Diarrhea and upper abdominal pain. Initial encounter.  EXAM: CT ABDOMEN AND PELVIS WITHOUT CONTRAST  TECHNIQUE: Multidetector CT imaging of the abdomen and pelvis was performed following the standard protocol without IV contrast.  COMPARISON:  None currently available  FINDINGS: BODY WALL: Coarse calcification noted in the inferior left breast, partially visualized.  There is a fatty periumbilical  hernia.  LOWER CHEST: Diffuse coronary atherosclerosis.  ABDOMEN/PELVIS:  Liver: No focal abnormality.  Biliary: Cholecystectomy. Minimal tissue in the gallbladder fossa is likely scarring. No biliary ductal dilatation.  Pancreas: Unremarkable.  Spleen: Unremarkable.  Adrenals: 2.7 cm right adrenal mass with Hounsfield units consistent with adenoma. This is a chronic mass based on report from 09/29/2002.  Kidneys and ureters: Bilateral nephrolithiasis, nonobstructive. The largest stone on the left is in the lower pole and measures 11 mm. The largest on the right is in the lower pole at 14 mm.  Bladder: Unremarkable.  Reproductive: Unremarkable.  Bowel: No obstruction. Appendectomy.  Retroperitoneum: No mass or adenopathy.  Peritoneum: No ascites or pneumoperitoneum.  Vascular: No acute abnormality.  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  IMPRESSION: 1. No acute intra-abdominal findings. 2. Extensive bilateral nephrolithiasis. 3. Right adrenal adenoma.   Electronically Signed   By: Tiburcio PeaJonathan  Watts M.D.   On: 03/16/2014 02:31    Medications: I have reviewed the patient's current medications.   LOS: 5 days   Samantha [name] 11/10/2013, 2:23 PM

## 2014-03-17 NOTE — Progress Notes (Signed)
  PROGRESS NOTE  Samantha RubensteinBarbara A [name] ZOX:[id number] DOB: 1946-06-02 DOA: 03/15/2014 PCP: Cassell SmilesFUSCO,LAWRENCE J., MD  Summary: [age]yow with abd pain since June, worked up with cardiac cath (unremarkable). HIDA revealed biliary dyskinesia. Subsequently underwent cholecystectomy but abd pain has not improved nor has diarrhea. Recently started on abx for leg or toe infection. Admitted for acute renal failure, LLE cellulitis, chronic diarrhea/n/abd pain.  Assessment/Plan: 1. Acute renal failure secondary dehydration, diarrhea 2. Right great toe cellulitis 3. Chronic abdominal pain, chronic diarrhea 4. Morbid obesity   Ongoing, chronic diarrhea and abdominal pain for 4 months with extensive investigation with unclear cause. Will f/u on GI recs.   Patient declines SNF, plans to return home.  Code Status: full code DVT prophylaxis: heparin Family Communication: none present; discussed with husband yesterday [facility]  Brendia Sacksaniel Carrye Goller, MD  [facility]  Pager [phone] If 7PM-7AM, please contact night-coverage at [email or web address], password [email or web address] 03/17/2014, 4:43 PM  LOS: 2 days   Consultants:  GI  Procedures:    Antibiotics:  Doxycycline 10/15 >>  HPI/Subjective: Diarrhea has recurred, abd pain has recurred. Feels poorly. Refuses SNF.  Objective: Filed Vitals:   03/16/14 1439 03/16/14 2104 03/17/14 0520 03/17/14 1421  BP: 121/55 119/76 120/53 153/74  Pulse: 93 85 91 82  Temp: 97.8 F (36.6 C) 97.9 F (36.6 C) 98.3 F (36.8 C) 98.2 F (36.8 C)  TempSrc: Oral Oral Oral Oral  Resp: 18 18 20 18   Height:      Weight:   131.226 kg (289 lb 4.8 oz)   SpO2: 93% 91% 95% 94%    Intake/Output Summary (Last 24 hours) at 03/17/14 1643 Last data filed at 03/16/14 1700  Gross per 24 hour  Intake    240 ml  Output      0 ml  Net    240 ml     Filed Weights   03/15/14 2102 03/17/14 0520  Weight: 131.543 kg (290 lb) 131.226 kg (289 lb 4.8 oz)    Exam:      Afebrile, VSS  General: appears calm; mildly uncomfortable  Psych: alert, speech fluent and clear  CV: RRR no m/r/g.   Respiratory: CTA bilaterally no w/r/r. Normal resp effor.t  Abdomen: soft, obese  Data Reviewed:  CBG stable  Potassium 3.3. Creatinine improved to 1.52  Hgb stable 11.1  Normal TSH  Scheduled Meds: . carvedilol  25 mg Oral BID WC  . doxycycline  100 mg Oral Q12H  . ezetimibe  10 mg Oral Daily  . feeding supplement (PRO-STAT SUGAR FREE 64)  30 mL Oral TID WC  . folic acid  1 mg Oral Daily  . gabapentin  100 mg Oral TID  . heparin  5,000 Units Subcutaneous 3 times per day  . insulin aspart  0-20 Units Subcutaneous TID WC  . insulin aspart  0-5 Units Subcutaneous QHS  . levothyroxine  112 mcg Oral QAC breakfast  . pravastatin  80 mg Oral QPC supper  . saccharomyces boulardii  250 mg Oral BID   Continuous Infusions:   Principal Problem:   ARF (acute renal failure) Active Problems:   Essential hypertension   Morbid obesity   Abdominal pain   Chronic diarrhea   Nausea   DM type 2 (diabetes mellitus, type 2)   Cellulitis of left leg   Nephrolithiasis   Pain of right great toe   Time spent 20 minutes

## 2014-03-17 NOTE — Clinical Social Work Psychosocial (Signed)
Clinical Social Work Department BRIEF PSYCHOSOCIAL ASSESSMENT 03/17/2014  Patient:  Samantha [name], Samantha [name]     Account Number:  [id number]     Admit date:  03/15/2014  Clinical Social Worker:  Legrand Como  Date/Time:  03/17/2014 09:12 AM  Referred by:  CSW  Date Referred:  03/16/2014 Referred for  SNF Placement   Other Referral:   Interview type:  Patient Other interview type:   Felipa Evener, patient    PSYCHOSOCIAL DATA Living Status:  HUSBAND Admitted from facility:   Level of care:   Primary support name:  Cyril Mourning Primary support relationship to patient:  SPOUSE Degree of support available:   Patient's husband and daughter are both very supportive.    CURRENT CONCERNS Current Concerns  Post-Acute Placement   Other Concerns:    SOCIAL WORK ASSESSMENT / PLAN CSW met with patient.  Patient indicated that she had reviewed that list of facilities previously provided to her.  Patient stated that if she had to go to a facility, that she'd prefer to go to [facility]. Patient reported that she had spoke to her husband on yesterday regarding the possibility of a four week rehab placement. Patient stated that her husband currently preferred for her to do rehab in the home rather than a facility.  She stated that she is currently unsure as to whether she wants to attempt rehab in her home or go to a facility. Patient states that her husband and daughter (who lives next door) are very supportive of her.  Patient stated that at baseline she was able to ambulate with the assistance of a walker.  She stated that since her gallbladder surgery on 02/08/14, she has been unable to ambulate with the walker.  Patient stated that since the surgery she has been confined to her lift chair.  She stated that she sits in her lift chair all day and uses a  bedside commode. Patient stated that when she has to leave the home, she now uses a wheelchair. Patient requested that CSW speak to her husband about their  options upon his arrival this morning.  CSW advised authorization from [facility] would have to be sought if she chose to go to a SNF.  CSW agreed.  CSW met with patient's husband. Patient's husband confirmed information provided by patient earlier.  CSW educated and explained to patient's husband choices related to home health, SNF, and out patient rehab options.  Patient's husband indicated that he was unsure as to which option that he felt most comfortable with for patient. Patient stated that she and her husband wanted to discuss their options and that they would contact CSW with their decision. CSW provided the number where she could be reached. CSW advised patient's husband that authorization from [facility] would have to be sought should they agree on a SNF placement.  Patient agreed to contact CSW once she and husband had made a decision.   Assessment/plan status:  Information/Referral to [employer] Other assessment/ plan:   Information/referral to community resources:   Provided list of facilities.    PATIENT'S/FAMILY'S RESPONSE TO PLAN OF CARE: Patient's husband has agreed to discuss options with patient and contact CSW with their decision.     Ambrose Pancoast, [place]

## 2014-03-17 NOTE — Clinical Social Work Note (Signed)
[facility]. She has chosen to return home with home health. CSW will notify CM and sign off.  Derenda FennelKara Gustabo [place], [place] [phone]

## 2014-03-17 NOTE — Consult Note (Signed)
REVIEWED.  

## 2014-03-17 NOTE — Clinical Social Work Note (Signed)
Clinical Social Work Department CLINICAL SOCIAL WORK PLACEMENT NOTE 03/17/2014  Patient:  Samantha [name],[name]  Account Number:  [id number] Admit date:  03/15/2014  Clinical Social Worker:  Tretha SciaraHEATHER Tila Millirons, LCSW  Date/time:  03/17/2014 12:30 PM  Clinical Social Work is seeking post-discharge placement for this patient at the following level of care:   SKILLED NURSING   (*CSW will update this form in Epic as items are completed)   03/16/2014  [facility]/family provided with [facility] of Clinical Social Work's list of facilities offering this level of care within the geographic area requested by the patient (or if unable, by the patient's family).  03/16/2014  Patient/family informed of their freedom to choose among providers that offer the needed level of care, that participate in Medicare, Medicaid or managed care program needed by the patient, have an available bed and are willing to accept the patient.  03/16/2014  Patient/family informed of [facility]' ownership interest in [facility], as well as of the fact that they are under no obligation to receive care at this facility.  PASARR submitted to EDS on 03/16/2014 PASARR number received on 03/16/2014  FL2 transmitted to all facilities in geographic area requested by pt/family on  03/17/2014 FL2 transmitted to all facilities within larger geographic area on   Patient informed that his/her managed care company has contracts with or will negotiate with  certain facilities, including the following:   Patient advised that [facility] would have to authorize the placement.     Patient/family informed of bed offers received:   Patient chooses bed at  Physician recommends and patient chooses bed at    Patient to be transferred to  on   Patient to be transferred to facility by  Patient and family notified of transfer on  Name of family member notified:    The following physician request were entered in  Epic:   Additional Comments: CSW spoke with patient and husband who indicated that they have made the decision [facility]. CSW advised patient and husband that CSW would contact [facility] for [facility] and [facility] for [facility]. Patient's husband requested that CS[facility] check [facility] before he and patient began considering other facility options as a second choice. CSW contacted Lillia AbedLindsay with [facility] and [facility]  Lillia AbedLindsay indicated that she would review the case and contact CSW with authorization decision. [facility]. CS[facility] with [facility] who indicated that she would review patient's clinicals and contact CSW with a decision.   Tretha SciaraHeather Deen Deguia  [phone]

## 2014-03-18 LAB — GLUCOSE, CAPILLARY
GLUCOSE-CAPILLARY: 160 mg/dL — AB (ref 70–99)
Glucose-Capillary: 159 mg/dL — ABNORMAL HIGH (ref 70–99)
Glucose-Capillary: 136 mg/dL — ABNORMAL HIGH (ref 70–99)

## 2014-03-18 LAB — BASIC METABOLIC PANEL
Anion gap: 12 (ref 5–15)
BUN: 15 mg/dL (ref 6–23)
CO2: 25 mEq/L (ref 19–32)
Calcium: 9 mg/dL (ref 8.4–10.5)
Chloride: 106 mEq/L (ref 96–112)
Creatinine, Ser: 1.45 mg/dL — ABNORMAL HIGH (ref 0.50–1.10)
GFR calc Af Amer: 42 mL/min — ABNORMAL LOW (ref >90)
GFR calc non Af Amer: 36 mL/min — ABNORMAL LOW (ref >90)
Glucose, Bld: 134 mg/dL — ABNORMAL HIGH (ref 70–99)
Potassium: 3.6 mEq/L — ABNORMAL LOW (ref 3.7–5.3)
Sodium: 143 mEq/L (ref 137–147)

## 2014-03-18 MED ORDER — DICYCLOMINE HCL 10 MG PO CAPS: 10.0000 mg | ORAL_CAPSULE | Freq: Every day | ORAL | Status: DC

## 2014-03-18 MED ORDER — COLESTIPOL HCL 1 G PO TABS: 2.0000 g | ORAL_TABLET | Freq: Two times a day (BID) | ORAL | Status: DC

## 2014-03-18 MED ORDER — LORAZEPAM 0.5 MG PO TABS
0.5000 mg | ORAL_TABLET | Freq: Once | ORAL | Status: AC
  Administered 2014-03-18: 0.5 mg via ORAL
  Filled 2014-03-18: qty 1

## 2014-03-18 MED ORDER — POTASSIUM CHLORIDE CRYS ER 20 MEQ PO TBCR
40.0000 meq | EXTENDED_RELEASE_TABLET | Freq: Once | ORAL | Status: AC
  Administered 2014-03-18: 40 meq via ORAL
  Filled 2014-03-18: qty 2

## 2014-03-18 MED ORDER — SACCHAROMYCES BOULARDII 250 MG PO CAPS: 250.0000 mg | ORAL_CAPSULE | Freq: Two times a day (BID) | ORAL | Status: DC

## 2014-03-18 NOTE — Progress Notes (Signed)
Patient ID: Samantha [name], female   DOB: Jan 13, 1946, 68 y.o.   MRN: [id number]   Assessment/Plan: ADMITTED WITH ABDOMINAL PAIN/DIARRHEA AFTER BEING TAKING ZITHROMAX AND DOXYCYCLINE. CLINICALLY IMPROVED AFTER ZITHROMAX STOPPED AND BEING PLACED ON DAIRY FREE/LOW FAT DIET & BENTYL/COLESTID BID.  PLAN: 1. BENTYL QAM WHILE ON DOXYCYCLINE THEN  PRN. 2. CONTINUE COLESTID BID 3. LOW FAT/DIABETIC DIET 4. CONTINUE FLORASTOR FOR ONE MONTH  Subjective: Since I last evaluated the patient SHE HAS HAD NO BM SINCE YESTERDAY. ABDOMINAL CRAMPS ARE GONE. TOLERATING POs BUT DOESN'T LIKE HER DIET. CONCERNED BECAUSE FELT RN/CNAs LAST NIGHT UNABLE TO RESPOND TO HER CALL BELL.  Objective: Vital signs in last 24 hours: Filed Vitals:   03/18/14 0632  BP: 146/92  Pulse: 78  Temp: 98 F (36.7 C)  Resp: 16    General appearance: alert, cooperative and no distress Resp: clear to auscultation bilaterally Cardio: regular rate and rhythm GI: soft, non-tender; bowel sounds normal Lab Results:   Studies/Results: No results found.  Medications: I have reviewed the patient's current medications.   LOS: 5 days   Jonette EvaSandi Ansel Ferrall 11/10/2013, 2:23 PM

## 2014-03-18 NOTE — Discharge Summary (Addendum)
Physician Discharge Summary  Samantha RubensteinBarbara A [name] ZOX:[id number] DOB: April 22, 1946 DOA: 03/15/2014  PCP: Cassell SmilesFUSCO,LAWRENCE J., MD  Admit date: 03/15/2014 Discharge date: 03/18/2014  Recommendations for Outpatient Follow-up:  1. F/u acute renal failure, consider repeat BMP. Metformin and enalapril on hold for now. 2. Chronic diarrhea, improved, see below, f/u with GI in 4-6 weeks 3. Resolution of right 1st toe cellulitis 4. Chronic abdominal pain 5. Elevated uric acid of unclear significance, see below 6. HH RN, PT, OT   Follow-up Information   [facility] (They will call you)    Specialty:  Home Health Services   Contact information:   [address] [place] [address] [phone]       Follow up with Cassell SmilesFUSCO,LAWRENCE J., MD. Schedule an appointment as soon as possible for a visit in 1 week.   Specialty:  Internal Medicine   Contact information:   [address] [place] [address] [phone]       Follow up with Eula Listenobert Rourk, MD. Schedule an appointment as soon as possible for a visit in 4 weeks.   Specialty:  Gastroenterology   Contact information:   [address] [place] [address] [phone]      Discharge Diagnoses:  1. Acute renal failure 2. Diarrhea, chronic 3. Dehydration 4. Right great toe cellulitis 5. Chronic abdominal pain 6. Morbid obesity 7. DM  Discharge Condition: improved Disposition: home  Diet recommendation: heart healthy diabetic diet, diary free, low fat  Filed Weights   03/15/14 2102 03/17/14 0520 03/18/14 [id number]  Weight: 131.543 kg (290 lb) 131.226 kg (289 lb 4.8 oz) 131.2 kg (289 lb 3.9 oz)    History of present illness:  68yow with abd pain since June, worked up with cardiac cath (unremarkable). HIDA revealed biliary dyskinesia. Subsequently underwent cholecystectomy but abd pain has not improved nor has diarrhea. Recently started on abx for leg or toe infection. Admitted for acute  renal failure, LLE cellulitis, chronic diarrhea/n/abd pain.  Hospital Course:  Ms. Arlana Pouchate was seen by GI and started on colestid and Bentyl with resolution of diarrhea and abdominal pain. No further inpatient workup recommended, plan for outpatient evaluation. Acute renal failure resolving rapidly, secondary to diarrhea and dehydration, complicated by Lasix and enalapril. Hospitalization was uncomplicated, see below. SNF was recommended but patient refused.  1. Acute renal failure secondary dehydration, diarrhea, enalapril. Much improved with IVF; expect spontaneous resolution. 2. Chronic diarrhea, though secondary to bile salt and functional component. Cdiff negative, infection doubted. Stool culture pending.  3. Right great toe cellulitis appears resolved. 4. Chronic abdominal pain, chronic diarrhea.  5. Morbid obesity 6. DM, stable 7. Unintentional weight loss, not thought secondary to diarrhea per GI. Follow-up as an outpatient 8. Elevated uric acid, significance unclear, no h/o gout. Given improvement with abx doubt gout at this time. Discussed with patient. Improved with resolution of diarrhea and abdominal pain with Colestid and Bentyl  Diary free, low fat diet  Bentyl daily while on abx, with breakfast. Continue colestid BID, continue probiotic x1 month.  OPV 4-6 weeks with Dr. Jena Gaussourk to consider TCS  Hold enalapril, metformin  [facility]  Consultants:  GI  OT: SNF  PT: SNF Procedures:  Antibiotics:  Doxycycline 10/15 >> complete Rx given by PCP  Discharge Instructions  Discharge Instructions   Diet - low sodium heart healthy    Complete by:  As directed      Diet Carb Modified  Complete by:  As directed      Discharge instructions    Complete by:  As directed   Call your physician or seek immediate medical attention for severe abdominal pain, uncontrolled diarrhea or worsening of condition. You should not take enalapril or metformin until told to you by your  doctor (stopping because of kidney function which is improving). Recommend low fat, diary-free diet in addition to heart healthy diabetic diet.     Increase activity slowly    Complete by:  As directed           Current Discharge Medication List    START taking these medications   Details  colestipol (COLESTID) 1 G tablet Take 2 tablets (2 g total) by mouth 2 (two) times daily before a meal. Qty: 120 tablet, Refills: 0    dicyclomine (BENTYL) 10 MG capsule Take 1 capsule (10 mg total) by mouth daily before breakfast. Qty: 10 capsule, Refills: 0    saccharomyces boulardii (FLORASTOR) 250 MG capsule Take 1 capsule (250 mg total) by mouth 2 (two) times daily. Qty: 60 capsule, Refills: 0      CONTINUE these medications which have NOT CHANGED   Details  aspirin 81 MG tablet Take 162 mg by mouth daily.     carvedilol (COREG) 25 MG tablet Take 1 tablet (25 mg total) by mouth 2 (two) times daily with a meal. Qty: 60 tablet, Refills: 11    Coenzyme Q10 (CO Q 10) 100 MG CAPS Take 100 mg by mouth 2 (two) times daily.     doxycycline (VIBRA-TABS) 100 MG tablet Take 100 mg by mouth 2 (two) times daily. Started on 03/13/14    folic acid (FOLVITE) 1 MG tablet Take 1 tablet (1 mg total) by mouth daily. Qty: 90 tablet, Refills: 3    furosemide (LASIX) 40 MG tablet Take 80 mg by mouth daily.    gabapentin (NEURONTIN) 100 MG capsule Take 100 mg by mouth 3 (three) times daily.    IRON PO Take 365 mg by mouth daily.     levothyroxine (SYNTHROID, LEVOTHROID) 112 MCG tablet Take 112 mcg by mouth daily before breakfast.    niacin 500 MG tablet Take 500 mg by mouth 2 (two) times daily with a meal.    pravastatin (PRAVACHOL) 80 MG tablet Take 1 tablet (80 mg total) by mouth daily. Qty: 90 tablet, Refills: 3    ZETIA 10 MG tablet Take 1 tablet by mouth daily.    glipiZIDE (GLUCOTROL) 10 MG tablet Take 2.5 mg by mouth daily as needed (for high blood sugar).    HYDROcodone-acetaminophen  (NORCO) 7.5-325 MG per tablet Take 1 tablet by mouth every 6 (six) hours as needed for moderate pain.     traMADol (ULTRAM) 50 MG tablet Take 100 mg by mouth 3 (three) times daily. Takes 2 tablets 4 times daily.      STOP taking these medications     enalapril (VASOTEC) 10 MG tablet      metFORMIN (GLUCOPHAGE) 500 MG tablet        Allergies  Allergen Reactions  . Clarithromycin Anaphylaxis  . Levofloxacin Anaphylaxis  . Metronidazole Anaphylaxis  . Sulfonamide Derivatives Anaphylaxis  . Amoxicillin-Pot Clavulanate     Heart palpitations  . Amoxicillin-Pot Clavulanate   . Clindamycin/Lincomycin Diarrhea and Nausea And Vomiting  . Lansoprazole     Unknown    The results of significant diagnostics from this hospitalization (including imaging, microbiology, ancillary and laboratory) are listed below for reference.  Significant Diagnostic Studies: Ct Abdomen Pelvis Wo Contrast  03/16/2014   CLINICAL DATA:  Diarrhea and upper abdominal pain. Initial encounter.  EXAM: CT ABDOMEN AND PELVIS WITHOUT CONTRAST  TECHNIQUE: Multidetector CT imaging of the abdomen and pelvis was performed following the standard protocol without IV contrast.  COMPARISON:  None currently available  FINDINGS: BODY WALL: Coarse calcification noted in the inferior left breast, partially visualized.  There is a fatty periumbilical hernia.  LOWER CHEST: Diffuse coronary atherosclerosis.  ABDOMEN/PELVIS:  Liver: No focal abnormality.  Biliary: Cholecystectomy. Minimal tissue in the gallbladder fossa is likely scarring. No biliary ductal dilatation.  Pancreas: Unremarkable.  Spleen: Unremarkable.  Adrenals: 2.7 cm right adrenal mass with Hounsfield units consistent with adenoma. This is a chronic mass based on report from 09/29/2002.  Kidneys and ureters: Bilateral nephrolithiasis, nonobstructive. The largest stone on the left is in the lower pole and measures 11 mm. The largest on the right is in the lower pole at 14 mm.   Bladder: Unremarkable.  Reproductive: Unremarkable.  Bowel: No obstruction. Appendectomy.  Retroperitoneum: No mass or adenopathy.  Peritoneum: No ascites or pneumoperitoneum.  Vascular: No acute abnormality.  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  IMPRESSION: 1. No acute intra-abdominal findings. 2. Extensive bilateral nephrolithiasis. 3. Right adrenal adenoma.   Electronically Signed   By: Tiburcio Pea M.D.   On: 03/16/2014 02:31    Microbiology: Recent Results (from the past 240 hour(s))  STOOL CULTURE     Status: None   Collection Time    03/16/14  2:37 AM      Result Value Ref Range Status   Specimen Description STOOL   Final   Special Requests NONE   Final   Culture     Final   Value: NO SUSPICIOUS COLONIES, CONTINUING TO HOLD     Performed at [facility]   Report Status PENDING   Incomplete  CLOSTRIDIUM DIFFICILE BY PCR     Status: None   Collection Time    03/16/14  2:37 AM      Result Value Ref Range Status   C difficile by pcr NEGATIVE  NEGATIVE Final     Labs: Basic Metabolic Panel:  Recent Labs Lab 03/15/14 2330 03/17/14 0556 03/18/14 0701  NA 140 146 143  K 3.7 3.3* 3.6*  CL 95* 107 106  CO2 29 28 25   GLUCOSE 139* 126* 134*  BUN 21 13 15   CREATININE 1.82* 1.52* 1.45*  CALCIUM 9.8 9.1 9.0   Liver Function Tests:  Recent Labs Lab 03/15/14 2330 03/17/14 0556  AST 13 13  ALT 10 7  ALKPHOS 75 62  BILITOT 0.3 0.3  PROT 7.7 6.5  ALBUMIN 3.5 2.9*   CBC:  Recent Labs Lab 03/15/14 2330 03/17/14 0556  WBC 10.5 7.8  NEUTROABS 7.8*  --   HGB 11.9* 11.1*  HCT 35.8* 34.3*  MCV 87.3 88.4  PLT 296 265   CBG:  Recent Labs Lab 03/17/14 1151 03/17/14 1624 03/17/14 2111 03/18/14 0717 03/18/14 1203  GLUCAP 165* 120* 159* 136* 160*    Principal Problem:   ARF (acute renal failure) Active Problems:   Essential hypertension   Morbid obesity   Abdominal pain    Chronic diarrhea   Nausea   DM type 2 (diabetes mellitus, type 2)   Cellulitis of left leg   Nephrolithiasis   Pain of right great toe   Time coordinating discharge: 35 minutes  Signed:  Brendia Sacks, MD [facility] 03/18/2014, 2:30 PM

## 2014-03-18 NOTE — Progress Notes (Signed)
PROGRESS NOTE  Samantha RubensteinBarbara A [name] [id number] DOB: August 14, 1945 DOA: 03/15/2014 PCP: Cassell SmilesFUSCO,LAWRENCE J., MD  Summary: [age]yow with abd pain since June, worked up with cardiac cath (unremarkable). HIDA revealed biliary dyskinesia. Subsequently underwent cholecystectomy but abd pain has not improved nor has diarrhea. Recently started on abx for leg or toe infection. Admitted for acute renal failure, LLE cellulitis, chronic diarrhea/n/abd pain.  Assessment/Plan: 1. Acute renal failure secondary dehydration, diarrhea, enalapril. Much improved with IVF; expect spontaneous resolution. 2. Chronic diarrhea, though secondary to bile salt and functional component. Cdiff negative, infection doubted. Stool culture pending.  3. Right great toe cellulitis appears resolved. 4. Chronic abdominal pain, chronic diarrhea.  5. Morbid obesity 6. DM, stable 7. Unintentional weight loss, not thought secondary to diarrhea per GI. Follow-up as an outpatient 8. Elevated uric acid, significance unclear, no h/o gout. Given improvement with abx doubt gout at this time. Discussed with patient.   Improved with resolution of diarrhea and abdominal pain with Colestid and Bentyl  Diary free, low fat diet  Bentyl daily while on abx, with breakfast. Continue colestid BID, continue probiotic x1 month.   OPV 4-6 weeks with Dr. Jena Gaussourk to consider TCS  Hold enalapril, metformin  [facility]  Code Status: full code DVT prophylaxis: heparin Family Communication: discussed with husband, family at bedside [facility] Plan[facility]  Brendia Sacksaniel Tenita Cue, MD  [facility]  Pager [phone] If 7PM-7AM, please contact night-coverage at [email or web address], password [email or web address] 03/18/2014, 12:55 PM  LOS: 3 days   Consultants:  GI  OT: SNF  PT: SNF  Procedures:    Antibiotics:  Doxycycline 10/15 >> 10/21  HPI/Subjective: GI recs noted, discussed with Dr. Darrick PennaFields.  Feeling better. No diarrhea approximately last 24  hours. Abdominal pain has resolved. Wants to go home. Left toe feels better.  Objective: Filed Vitals:   03/17/14 0520 03/17/14 1421 03/17/14 2255 03/18/14 0632  BP: 120/53 153/74 168/77 146/92  Pulse: 91 82 88 78  Temp: 98.3 F (36.8 C) 98.2 F (36.8 C) 97.4 F (36.3 C) 98 F (36.7 C)  TempSrc: Oral Oral Oral Oral  Resp: 20 18 20 16   Height:      Weight: 131.226 kg (289 lb 4.8 oz)   131.2 kg (289 lb 3.9 oz)  SpO2: 95% 94% 96% 96%    Intake/Output Summary (Last 24 hours) at 03/18/14 1255 Last data filed at 03/18/14 [id number]  Gross per 24 hour  Intake    240 ml  Output    600 ml  Net   -360 ml     Filed Weights   03/15/14 2102 03/17/14 0520 03/18/14 [id number]  Weight: 131.543 kg (290 lb) 131.226 kg (289 lb 4.8 oz) 131.2 kg (289 lb 3.9 oz)    Exam:     Afebrile, VSS, no hypoxia  General. Appears calm and comfortable  Psychiatric. Alert, grossly normal mentation, speech fluent and clear  CV. RRR no m/r/g. No LE edema.  Respiratory. CTA bilaterally no w/r/r. Normal resp effort.  Abdomen soft ntnd, obese  Skin. Great toe right foot with minimal erythema, no edema, non-tender. Much improved.  Data Reviewed:  CBG stable  Creatinine down to 1.35. K+ 3.6  Scheduled Meds: . carvedilol  25 mg Oral BID WC  . colestipol  2 g Oral BID AC  . [START ON 03/19/2014] dicyclomine  10 mg Oral QAC breakfast  . doxycycline  100 mg Oral Q12H  . ezetimibe  10 mg Oral Daily  . feeding supplement (PRO-STAT  SUGAR FREE 64)  30 mL Oral TID WC  . folic acid  1 mg Oral Daily  . gabapentin  100 mg Oral TID  . heparin  5,000 Units Subcutaneous 3 times per day  . insulin aspart  0-20 Units Subcutaneous TID WC  . insulin aspart  0-5 Units Subcutaneous QHS  . levothyroxine  112 mcg Oral QAC breakfast  . pravastatin  80 mg Oral QPC supper  . saccharomyces boulardii  250 mg Oral BID   Continuous Infusions:   Principal Problem:   ARF (acute renal failure) Active Problems:   Essential  hypertension   Morbid obesity   Abdominal pain   Chronic diarrhea   Nausea   DM type 2 (diabetes mellitus, type 2)   Cellulitis of left leg   Nephrolithiasis   Pain of right great toe   Time spent 20 minutes

## 2014-03-18 NOTE — Progress Notes (Signed)
Patient discharge teaching given, including activity, diet, follow-up appoints, and medications. Patient verbalized understanding of all discharge instructions. IV access was d/c'd. Vitals are stable. Skin is intact except as charted in most recent assessments. Pt to be escorted out by NT, to be driven home by family. Bertram DenverMckinney, Elazar Argabright D, RN

## 2014-03-18 NOTE — Progress Notes (Signed)
[facility] contacted.  Dareen Pianodwinna RN will be in touch with patient

## 2014-03-19 ENCOUNTER — Telehealth: Payer: Self-pay | Admitting: Gastroenterology

## 2014-03-19 NOTE — Telephone Encounter (Signed)
PT NEEDS OPV E30 W/ DR. Jena GaussOURK IN 4-6 WEEKS, Dx: DIARRHEA/RECTAL BLEEDING.

## 2014-03-20 ENCOUNTER — Encounter: Payer: Self-pay | Admitting: Internal Medicine

## 2014-03-20 LAB — STOOL CULTURE

## 2014-03-20 NOTE — Telephone Encounter (Signed)
APPT MADE AND LETTER SENT  °

## 2014-03-22 ENCOUNTER — Telehealth: Payer: Self-pay | Admitting: Cardiovascular Disease

## 2014-03-22 NOTE — Telephone Encounter (Signed)
Patient aware message routed to Dr Garfield CorneaBerry/Kathryn V.  RN

## 2014-03-22 NOTE — Telephone Encounter (Signed)
Pt called in stating that she was [facility] and she wanted to let Dr. Allyson SabalBerry know that she was instructed not to take Enalapril to better her kidney function until futher notice  Thanks

## 2014-03-23 ENCOUNTER — Telehealth: Payer: Self-pay | Admitting: Internal Medicine

## 2014-03-23 NOTE — Telephone Encounter (Signed)
I spoke with the pt. She came home from the hospital on Saturday. She had a BM on Sunday and has not had one since. She is taking all the medications that she was given at the hospital ( the bentyl and the colestid and the florastor) I advised there that the bentyl and the colestid can cause constipation, she said she is going to hold them for a couple of days and try to get her system back to normal and then take prn. Also advised her to continue the florastor and to make sure she is drinking plenty of water and eating a high fiber diet. Pt will call back if she continues to have problems.

## 2014-03-23 NOTE — Telephone Encounter (Signed)
Pt said that she had seen SF while she was in the hospital and wanted to let SF know that she hasn't had a bowel movement since Sunday and what would she recommend.  Per SF notes she wants patient to follow up in 4-6 weeks with RMR, so I have made this lady a RMR patient. Please advise and try to call patient back by 4 pm today at [phone]

## 2014-03-24 NOTE — Telephone Encounter (Signed)
Appreciate the optic

## 2014-03-24 NOTE — Telephone Encounter (Signed)
I spoke with patient and explained Dr Hazle CocaBerry's recommendations to stay off the enalapril.  She will monitor her BP and contact us if it is >150/90

## 2014-03-24 NOTE — Telephone Encounter (Signed)
PLEASE CALL PT. SHE HAD ACUTE ON CHRONIC DIARRHEA DUE TO ABX. SHE SHOULD HOLD BENTYL AND CONTINUE COLESTID AT LEAST ONCE A DAY. CONTINUE FLORASTOR.

## 2014-03-27 NOTE — Telephone Encounter (Signed)
I called and informed pt. She is complaining of abdominal pain whenever she eats anything. She had oatmeal this morning and her stomachstarted hurting. She had chicken salad yesterday and had the abdominal pain. She had a BM yesterday but it was more firm and it took her awhile to get it out. She said she has been sick every since she had her gall bladder removed. Said she doesn't know what to do, she just wants to feel better.  She is aware that DR. Fields is on vacation, and I will be routing this to Tana CoastLeslie Lewis, [place].

## 2014-03-27 NOTE — Telephone Encounter (Addendum)
Would recommend holding colestid for constipation. May take on days stools are more loose.   Call with progress report regarding abdominal pain, once constipation resolves.   Keep OV with RMR as planned.  Let's try PPI if she is not on one. Let me know if she is not on one.

## 2014-03-29 MED ORDER — PANTOPRAZOLE SODIUM 40 MG PO TBEC: 40.0000 mg | DELAYED_RELEASE_TABLET | Freq: Every day | ORAL | Status: DC

## 2014-03-29 NOTE — Addendum Note (Signed)
Addended by: Tiffany KocherLEWIS, Yolinda Duerr S on: 03/29/2014 04:40 PM   Modules accepted: Orders

## 2014-03-29 NOTE — Telephone Encounter (Signed)
RX for pantoprazole for once daily sent. Please find out what allergy she had with lansoprazole. ?serious reaction?

## 2014-03-29 NOTE — Telephone Encounter (Signed)
Verlon AuLeslie, I'm sorry, it should have been she said she is NOT on a PPI.  Please advise!

## 2014-03-29 NOTE — Telephone Encounter (Signed)
I called and informed pt. She is on on a PPI. Please advise!

## 2014-03-29 NOTE — Telephone Encounter (Signed)
Instructions as per 03/27/14. Increase PPI to BID.  Keep OV with RMR only.

## 2014-03-30 NOTE — Telephone Encounter (Signed)
Noted. OK for pantoprazle.

## 2014-03-30 NOTE — Telephone Encounter (Signed)
Pt is aware that Rx was sent in. She was on the Prevacid, Flagyl and Biaxin all at one time when she had the problem with her heart racing and the shortness of breath and her PCP just listed them all since he was unsure which one actually caused the problem.

## 2014-04-29 ENCOUNTER — Other Ambulatory Visit: Payer: Self-pay | Admitting: Internal Medicine

## 2014-05-01 NOTE — Telephone Encounter (Signed)
Rx was sent to pharmacy electronically. 

## 2014-05-04 ENCOUNTER — Telehealth: Payer: Self-pay | Admitting: Gastroenterology

## 2014-05-04 NOTE — Telephone Encounter (Signed)
Patient is a Dr. Jena Gaussourk patient

## 2014-05-04 NOTE — Telephone Encounter (Signed)
Looks like an old R MR patient with recent consult by [facility]. I do not care one way or the other.

## 2014-05-04 NOTE — Telephone Encounter (Signed)
Patient called this morning saying she needed to cancel her OV with SF in the morning. I told her we had her down to see RMR tomorrow and [facility] and would rather see SF.  Patient said she was feeling better and would reschedule later if she needed to.  Looking in the patient's chart LSL wanted patient to follow up with RMR only. Please advise if this is a RMR or SF patient.

## 2014-05-05 ENCOUNTER — Ambulatory Visit: Payer: Medicare HMO | Admitting: Internal Medicine

## 2014-05-11 ENCOUNTER — Encounter (HOSPITAL_COMMUNITY): Payer: Self-pay | Admitting: Cardiovascular Disease

## 2014-07-12 ENCOUNTER — Ambulatory Visit: Payer: Medicare HMO | Admitting: Podiatry

## 2014-07-24 ENCOUNTER — Ambulatory Visit (INDEPENDENT_AMBULATORY_CARE_PROVIDER_SITE_OTHER): Payer: Commercial Managed Care - HMO | Admitting: Podiatry

## 2014-07-24 ENCOUNTER — Encounter: Payer: Self-pay | Admitting: Podiatry

## 2014-07-24 VITALS — BP 162/86 | HR 70 | Resp 15 | Ht 70.5 in | Wt 274.0 lb

## 2014-07-24 DIAGNOSIS — B351 Tinea unguium: Secondary | ICD-10-CM

## 2014-07-24 DIAGNOSIS — E1151 Type 2 diabetes mellitus with diabetic peripheral angiopathy without gangrene: Secondary | ICD-10-CM

## 2014-07-24 DIAGNOSIS — E0842 Diabetes mellitus due to underlying condition with diabetic polyneuropathy: Secondary | ICD-10-CM

## 2014-07-24 NOTE — Progress Notes (Signed)
   Subjective:    Patient ID: Samantha [name], female    DOB: 1945/06/23, 69 y.o.   MRN: [id number]  HPI Comments: N debridement L toenails D and O long-term C elongated toenails A Diabetic, difficult to cut T hx of debridement by Dr. Emilio Mathavid Tucker  Diabetes   this patient has been seeing podiatrist for nail debridement, however, the podiatrist does not accept her current insurance Patient has history of peripheral arterial disease under the evaluation of Dr. Nanetta BattyJonathan Berry with a arterial Doppler on 11/11/2013 that was reviewed.    Review of Systems  HENT: Positive for congestion, sinus pressure and tinnitus.   Respiratory: Positive for shortness of breath.   Cardiovascular: Positive for leg swelling.  Endocrine: Positive for cold intolerance and heat intolerance.  Musculoskeletal: Positive for arthralgias and gait problem.       Knee pain causing immobility.  Hematological: Bruises/bleeds easily.  All other systems reviewed and are negative.      Objective:   Physical Exam  Patient appears orientated 3 Patient seated in a wheelchair is unable to transfer to treatment chair  Vascular: Pitting edema lower extremities bilaterally DP pulses 0/4 bilaterally PT pulses 0/4 bilaterally  Neurological: Sensation to 10 g monofilament wire intact 0/5 bilaterally Vibratory sensation nonreactive bilaterally Ankle reflexes weakly reactive bilaterally  Dermatological: Dry blood noted distal third and fifth left toes without any surrounding erythema, edema from the areas Skin peeling noted lateral fifth right toe without any active drainage, erythema, warmth The toenails are elongated, hypertrophic, brittle 6-10  Musculoskeletal: Limited but symmetrical range of motion of ankle, subtalar, midtarsal joints bilaterally       Assessment & Plan:   Assessment: Peripheral arterial disease under evaluation and management of Dr. Nanetta BattyJonathan Berry Diabetic peripheral neuropathy Loss  of protective sensation bilaterally Mycotic toenails 6-10 Shoe irritation causing friction fifth right toe and third and fifth left toe  Plan: Advised patient to check shoe fit to avoid further irritation. Advised application of Vaseline to the fifth right toe until healed  Debridement toenails 10 without a bleeding  Reappoint at three-month intervals:

## 2014-07-24 NOTE — Patient Instructions (Signed)
Check your shoe fit which is causing irritation to the fifth right toe Apply Vaseline to the fifth right toe daily until healed Diabetes and Foot Care Diabetes may cause you to have problems because of poor blood supply (circulation) to your feet and legs. This may cause the skin on your feet to become thinner, break easier, and heal more slowly. Your skin may become dry, and the skin may peel and crack. You may also have nerve damage in your legs and feet causing decreased feeling in them. You may not notice minor injuries to your feet that could lead to infections or more serious problems. Taking care of your feet is one of the most important things you can do for yourself.  HOME CARE INSTRUCTIONS  Wear shoes at all times, even in the house. Do not go barefoot. Bare feet are easily injured.  Check your feet daily for blisters, cuts, and redness. If you cannot see the bottom of your feet, use a mirror or ask someone for help.  Wash your feet with warm water (do not use hot water) and mild soap. Then pat your feet and the areas between your toes until they are completely dry. Do not soak your feet as this can dry your skin.  Apply a moisturizing lotion or petroleum jelly (that does not contain alcohol and is unscented) to the skin on your feet and to dry, brittle toenails. Do not apply lotion between your toes.  Trim your toenails straight across. Do not dig under them or around the cuticle. File the edges of your nails with an emery board or nail file.  Do not cut corns or calluses or try to remove them with medicine.  Wear clean socks or stockings every day. Make sure they are not too tight. Do not wear knee-high stockings since they may decrease blood flow to your legs.  Wear shoes that fit properly and have enough cushioning. To break in new shoes, wear them for just a few hours a day. This prevents you from injuring your feet. Always look in your shoes before you put them on to be sure there  are no objects inside.  Do not cross your legs. This may decrease the blood flow to your feet.  If you find a minor scrape, cut, or break in the skin on your feet, keep it and the skin around it clean and dry. These areas may be cleansed with mild soap and water. Do not cleanse the area with peroxide, alcohol, or iodine.  When you remove an adhesive bandage, be sure not to damage the skin around it.  If you have a wound, look at it several times a day to make sure it is healing.  Do not use heating pads or hot water bottles. They may burn your skin. If you have lost feeling in your feet or legs, you may not know it is happening until it is too late.  Make sure your health care provider performs a complete foot exam at least annually or more often if you have foot problems. Report any cuts, sores, or bruises to your health care provider immediately. SEEK MEDICAL CARE IF:   You have an injury that is not healing.  You have cuts or breaks in the skin.  You have an ingrown nail.  You notice redness on your legs or feet.  You feel burning or tingling in your legs or feet.  You have pain or cramps in your legs and feet.  Your legs or feet are numb.  Your feet always feel cold. SEEK IMMEDIATE MEDICAL CARE IF:   There is increasing redness, swelling, or pain in or around a wound.  There is a red line that goes up your leg.  Pus is coming from a wound.  You develop a fever or as directed by your health care provider.  You notice a bad smell coming from an ulcer or wound. Document Released: 05/16/2000 Document Revised: 01/19/2013 Document Reviewed: 10/26/2012 [facility] Patient Information 2015 [place], [place]. This information is not intended to replace advice given to you by your health care provider. Make sure you discuss any questions you have with your health care provider.

## 2014-08-09 ENCOUNTER — Other Ambulatory Visit (HOSPITAL_COMMUNITY): Payer: Self-pay | Admitting: Nephrology

## 2014-08-09 DIAGNOSIS — N289 Disorder of kidney and ureter, unspecified: Secondary | ICD-10-CM

## 2014-08-10 ENCOUNTER — Ambulatory Visit: Payer: Medicare HMO | Admitting: Cardiovascular Disease

## 2014-08-30 ENCOUNTER — Ambulatory Visit (HOSPITAL_COMMUNITY): Payer: Commercial Managed Care - HMO

## 2014-08-31 ENCOUNTER — Other Ambulatory Visit: Payer: Self-pay | Admitting: *Deleted

## 2014-08-31 NOTE — Patient Outreach (Addendum)
[facility] Care Management  08/31/2014  Lesly RubensteinBarbara A Sorter [birth year] [id number]   Referral per MD and [facility]. Telephone call to patient; left message on voice mail requesting return call  Plan: will follow up next week.   Colleen CanLinda Harlym Gehling, RN BSN CCM Care Management Coordinator [facility] Care [facility]  [phone]

## 2014-09-04 ENCOUNTER — Other Ambulatory Visit: Payer: Self-pay | Admitting: *Deleted

## 2014-09-04 NOTE — Patient Outreach (Signed)
[facility]) Care Management  09/04/2014  Samantha RubensteinBarbara A Caissie 07-14-1945 [id number]  Telephone call attempt x 2; message left on voice mail requesting return call.  Plan: will follow up with patient.

## 2014-09-05 ENCOUNTER — Other Ambulatory Visit: Payer: Self-pay | Admitting: *Deleted

## 2014-09-05 NOTE — Patient Outreach (Signed)
[facility]) Care Management  09/05/2014  Samantha RubensteinBarbara A [name] 07/02/1945 [id number]  Telephone attempt x 3; left message on voice mail requesting return call.  Plan: will send outreach letter to patient and follow up.  Colleen CanLinda Artis Beggs, RN BSN CCM Care Management Coordinator [facility] Care Management  [phone]

## 2014-09-13 ENCOUNTER — Ambulatory Visit: Payer: Commercial Managed Care - HMO | Admitting: Cardiovascular Disease

## 2014-09-14 ENCOUNTER — Other Ambulatory Visit: Payer: Self-pay | Admitting: Cardiovascular Disease

## 2014-09-15 ENCOUNTER — Ambulatory Visit (INDEPENDENT_AMBULATORY_CARE_PROVIDER_SITE_OTHER): Payer: Commercial Managed Care - HMO | Admitting: Neurology

## 2014-09-15 ENCOUNTER — Encounter: Payer: Self-pay | Admitting: Neurology

## 2014-09-15 VITALS — BP 118/66 | HR 69 | Resp 18 | Wt 264.0 lb

## 2014-09-15 DIAGNOSIS — E1142 Type 2 diabetes mellitus with diabetic polyneuropathy: Secondary | ICD-10-CM

## 2014-09-15 DIAGNOSIS — R29898 Other symptoms and signs involving the musculoskeletal system: Secondary | ICD-10-CM

## 2014-09-15 DIAGNOSIS — I679 Cerebrovascular disease, unspecified: Secondary | ICD-10-CM

## 2014-09-15 DIAGNOSIS — I10 Essential (primary) hypertension: Secondary | ICD-10-CM | POA: Diagnosis not present

## 2014-09-15 DIAGNOSIS — E785 Hyperlipidemia, unspecified: Secondary | ICD-10-CM | POA: Diagnosis not present

## 2014-09-15 NOTE — Progress Notes (Signed)
NEUROLOGY CONSULTATION NOTE  Samantha RubensteinBarbara A [name] MRN: [id number] DOB: [birth year]  Referring provider: Dr. Phillips OdorGolding Primary care provider: Dr. Phillips OdorGolding  Reason for consult:  Evaluate for stroke  HISTORY OF PRESENT ILLNESS: Samantha BoastBarbara [name] is a 69 year old right-handed woman with hypertension, hyperlipidemia, hypothyroidism, iron deficiency anemia and type 2 diabetes who presents for possible stroke.  Records and labs reviewed.  She is accompanied by her husband who provides some history.  She underwent gall bladder surgery in September 2015.  Afterwards, she had a complication with the anesthesia and required intubation.  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  She has been evaluated by ENT.  She reports that she developed a fairly sudden onset of right foot weakness last June.  She has multiple co-morbidities.  She has extensive arthritis, particularly involving her knees, as well as significant venous stasis and diabetic neuropathy in the feet.  She is unable to walk.  She reportedly was evaluated by the orthopedist for her right foot.  He told her that she likely had a stroke.  She reports no change in symptoms since last year.   Hgb A1c form October was 6.8.  Most recent lipid panel from May showed cholesterol 88, HDL 88 and LDL 79.  PAST MEDICAL HISTORY: Past Medical History  Diagnosis Date  . Brain aneurysm   . CHF (congestive heart failure)   . Neuropathy   . Diabetes mellitus   . Anemia   . Thyroid disease   . Arthritis   . Macular degeneration   . Chest pain   . Hypertension   . Hyperlipidemia   . Obesity   . Abnormal stress test     -false positive, prob breast attenuation  . Hx of cardiac catheterization 11/2013    normal coronary arteries  . Sleep apnea     doesn't wear CPAP, sleeps in a lift chair  . Difficult intubation 02/08/14    re-intubated in PACU    PAST SURGICAL HISTORY: Past Surgical History  Procedure  Laterality Date  . Tonsillectomy    . Appendectomy    . Lower back    . Heel spurs    . Hammer toes    . Right knee sark    . Cerebral aneurysm repair    . Cardiac catheterization  12/22/13    done for "angina" and abnormal nuc study- normal coronary arteries  . Cardiac catheterization  2004    insignificant CAD  . Cholecystectomy N/A 02/08/2014    Procedure: LAPAROSCOPIC CHOLECYSTECTOMY;  Surgeon: Dalia HeadingMark A Jenkins, MD;  Location: [facility];  Service: General;  Laterality: N/A;  . Esophagogastroduodenoscopy  2007    Dr. Jena Gaussourk: mottled patchy erythema of stomach, small bowel bx negative  . Left heart catheterization with coronary angiogram N/A 12/22/2013    Procedure: LEFT HEART CATHETERIZATION WITH CORONARY ANGIOGRAM;  Surgeon: Runell GessJonathan J Berry, MD;  [facility];  Service: Cardiovascular;  Laterality: N/A;  . Transthoracic echocardiogram  10/05/2012    normal EF  . Doppler echocardiography  09/27/2010    EF>55%  . Nm myoview ltd  08/26/2011    EF 51% Low risk scan  . Cardiac catheterization  11/16/2002  . Lower ext arterial dopplers   11/11/2012    Abnormal dopplers  . Lower ext arterial dopplers  04/10/2011    abnormal dopplers  . Carotid duplex  04/10/2011    mildly abnormal carotid duplex  . Abdominal aortogram  05/07/2006  . Sleep study  08/10/2011  . Cpap report  09/03/2011    MEDICATIONS: Current Outpatient Prescriptions on File Prior to Visit  Medication Sig Dispense Refill  . aspirin 81 MG tablet Take 162 mg by mouth daily.     . carvedilol (COREG) 25 MG tablet Take 1 tablet (25 mg total) by mouth 2 (two) times daily with a meal. 60 tablet 11  . Coenzyme Q10 (CO Q 10) 100 MG CAPS Take 100 mg by mouth 2 (two) times daily.     . folic acid (FOLVITE) 1 MG tablet Take 1 tablet (1 mg total) by mouth daily. 90 tablet 3  . furosemide (LASIX) 40 MG tablet Take 80 mg by mouth daily.    Marland Kitchen gabapentin (NEURONTIN) 100 MG capsule Take 100 mg by mouth 3 (three) times daily.    Marland Kitchen glipiZIDE  (GLUCOTROL) 10 MG tablet Take 2.5 mg by mouth daily as needed (for high blood sugar).    . IRON PO Take 365 mg by mouth daily.     Marland Kitchen levothyroxine (SYNTHROID, LEVOTHROID) 112 MCG tablet Take 112 mcg by mouth daily before breakfast.    . niacin 500 MG tablet Take 500 mg by mouth at bedtime.     . pravastatin (PRAVACHOL) 80 MG tablet Take 1 tablet (80 mg total) by mouth daily. 90 tablet 3  . traMADol (ULTRAM) 50 MG tablet Take 100 mg by mouth 3 (three) times daily. Takes 2 tablets 4 times daily.    Marland Kitchen ZETIA 10 MG tablet TAKE ONE TABLET BY MOUTH ONCE DAILY. 30 tablet 11   No current facility-administered medications on file prior to visit.    ALLERGIES: Allergies  Allergen Reactions  . Clarithromycin Anaphylaxis  . Levofloxacin Anaphylaxis  . Metronidazole Anaphylaxis  . Sulfonamide Derivatives Anaphylaxis  . Amoxicillin-Pot Clavulanate     Heart palpitations  . Amoxicillin-Pot Clavulanate   . Clindamycin/Lincomycin Diarrhea and Nausea And Vomiting  . Lansoprazole     SOB, heart racing when took Prevpac    FAMILY HISTORY: Family History  Problem Relation Age of Onset  . Diabetes      family history   . Arthritis      family history   . Diabetes Mother   . Hypertension Mother   . Seizures Sister   . Colon cancer Neg Hx     SOCIAL HISTORY: History   Social History  . Marital Status: Married    Spouse Name: N/A  . Number of Children: 1  . Years of Education: 12   Occupational History  . Retired    Social History Main Topics  . Smoking status: Former Smoker    Quit date: 07/17/2002  . Smokeless tobacco: Not on file  . Alcohol Use: No  . Drug Use: No  . Sexual Activity: Not on file   Other Topics Concern  . Not on file   Social History Narrative    REVIEW OF SYSTEMS: Constitutional: No fevers, chills, or sweats, no generalized fatigue, change in appetite Eyes: No visual changes, double vision, eye pain Ear, nose and throat: No hearing loss, ear pain, nasal  congestion, sore throat Cardiovascular: No chest pain, palpitations Respiratory:  No shortness of breath at rest or with exertion, wheezes GastrointestinaI: No nausea, vomiting, diarrhea, abdominal pain, fecal incontinence Genitourinary:  No dysuria, urinary retention or frequency Musculoskeletal:  No neck pain, back pain Integumentary: No rash, pruritus, skin lesions Neurological: as above Psychiatric: No depression, insomnia, anxiety Endocrine: No palpitations, fatigue, diaphoresis,  mood swings, change in appetite, change in weight, increased thirst Hematologic/Lymphatic:  No anemia, purpura, petechiae. Allergic/Immunologic: no itchy/runny eyes, nasal congestion, recent allergic reactions, rashes  PHYSICAL EXAM: Filed Vitals:   09/15/14 1443  BP: 118/66  Pulse: 69  Resp: 18   General: No acute distress Head:  Normocephalic/atraumatic Eyes:  fundi unremarkable, without vessel changes, exudates, hemorrhages or papilledema. Neck: supple, no paraspinal tenderness, full range of motion Back: No paraspinal tenderness Heart: regular rate and rhythm Lungs: Clear to auscultation bilaterally. Vascular: No carotid bruits. Neurological Exam: Mental status: alert and oriented to person, place, and time, recent and remote memory intact, fund of knowledge intact, attention and concentration intact, speech fluent and not dysarthric, language intact. Cranial nerves: CN I: not tested CN II: pupils equal, round and reactive to light, visual fields intact, fundi unremarkable, without vessel changes, exudates, hemorrhages or papilledema. CN III, IV, VI:  full range of motion, no nystagmus, no ptosis CN V: facial sensation intact CN VII: upper and lower face symmetric CN VIII: hearing intact CN IX, X: gag intact, uvula midline CN XI: sternocleidomastoid and trapezius muscles intact CN XII: tongue midline Bulk & Tone: normal, no fasciculations. Motor:  Left hip flexion 4+/5, left ankle  dorsiflexion 2+/5, right hip flexion 2+/5, right hamstring 3+/5, right quadriceps 4-/5, right ankle dorsiflexion 1+/5, left deltoid 4+/5 (limited by pain).  Otherwise 5/5. Sensation:  Reduced pinprick and vibration sensation in feet up to mid ankles Deep Tendon Reflexes:  1+ in the arms, absent in ankles, toes equivocal Finger to nose testing:  No dysmetria Heel to shin:  Unable to assess Gait:  Unable to assess. Romberg unable to assess.  IMPRESSION: Right leg weakness, involving both proximal and distal muscles.  She has weakness in the left leg as well, but the right side is asymmetrically weaker.  Given that she says onset was sudden, a stroke is very possible.  She has multiple co-morbidities that compromise leg strength, such as diabetes, arthritis and venous stasis. Diabetes Hyperlipidemia  Hypertension  PLAN: 1.  I would just continue ASA  daily.  I wouldn't be too aggressive in making any changes.  Statistically, ASA  daily has not been shown to be inferior to full-strength aspirin or Plavix. 2.  We will check another fasting lipid panel.  LDL goal should be less than 70. 3.  Will check carotid doppler 4.  Follow up pending results, if needed  45 minutes spent with patient, over 50% spent discussing diagnosis and plan  Thank you for allowing me to take part in the care of this patient.  Shon Millet, DO  CC:  Assunta Found, MD

## 2014-09-15 NOTE — Patient Instructions (Addendum)
Continue aspirin 81mg  daily We will check carotid doppler to look for any significant blockage in your carotid arteries in the neck We will check your cholesterol level to determine if medication needs adjustment Will call you with results.  You have been scheduled at [facility] for Carotid Dopplers on 09/21/14 @ 10:30 am please arrive @ 10:15 am.  [phone]

## 2014-09-18 ENCOUNTER — Telehealth: Payer: Self-pay | Admitting: Neurology

## 2014-09-18 ENCOUNTER — Other Ambulatory Visit: Payer: Self-pay | Admitting: *Deleted

## 2014-09-18 DIAGNOSIS — I999 Unspecified disorder of circulatory system: Secondary | ICD-10-CM

## 2014-09-18 NOTE — Telephone Encounter (Signed)
Pt caregiver Mick SellDorey called and states that Dr Everlena CooperJaffe ordered some blood work and a test and they wanted both to be done in [place]. The blood work is going to be done in [place] and [facility] please call her before 2:30 at [phone]

## 2014-09-18 NOTE — Telephone Encounter (Signed)
I spoke with Samantha [name] and changed her appt  [facility] on 09/25/14 at 10am

## 2014-09-19 ENCOUNTER — Encounter: Payer: Self-pay | Admitting: *Deleted

## 2014-09-19 ENCOUNTER — Encounter (HOSPITAL_COMMUNITY): Payer: Commercial Managed Care - HMO

## 2014-09-19 NOTE — Patient Outreach (Signed)
[facility] Care Management  09/19/2014  Samantha RubensteinBarbara A [name] 1945/11/06 [id number]  [employer] referral:  Attempts to contact patient have been unsuccessful via telephone and outreach letter.  Plan will close case and send MD closure letter.  Colleen CanLinda Karn Derk, RN BSN CCM Care Management Coordinator [facility] Care Management  [phone]

## 2014-09-25 ENCOUNTER — Other Ambulatory Visit (HOSPITAL_COMMUNITY)
Admission: RE | Admit: 2014-09-25 | Discharge: 2014-09-25 | Disposition: A | Discharge status: Home / Self Care | Payer: Commercial Managed Care - HMO | Source: Ambulatory Visit | Attending: Neurology | Admitting: Neurology

## 2014-09-25 ENCOUNTER — Other Ambulatory Visit: Payer: Self-pay | Admitting: *Deleted

## 2014-09-25 ENCOUNTER — Ambulatory Visit (HOSPITAL_COMMUNITY)
Admission: RE | Admit: 2014-09-25 | Discharge: 2014-09-25 | Disposition: A | Discharge status: Home / Self Care | Payer: Commercial Managed Care - HMO | Source: Ambulatory Visit | Attending: Neurology | Admitting: Neurology

## 2014-09-25 ENCOUNTER — Encounter: Payer: Self-pay | Admitting: *Deleted

## 2014-09-25 DIAGNOSIS — R29898 Other symptoms and signs involving the musculoskeletal system: Secondary | ICD-10-CM

## 2014-09-25 DIAGNOSIS — I999 Unspecified disorder of circulatory system: Secondary | ICD-10-CM | POA: Insufficient documentation

## 2014-09-26 ENCOUNTER — Telehealth: Payer: Self-pay | Admitting: *Deleted

## 2014-09-26 LAB — LIPID PANEL
CHOL/HDL RATIO: 3.6 ratio
Cholesterol: 142 mg/dL (ref 0–200)
HDL: 40 mg/dL — ABNORMAL LOW (ref >=46)
LDL Cholesterol: 75 mg/dL (ref 0–99)
TRIGLYCERIDES: 137 mg/dL (ref <150)
VLDL: 27 mg/dL (ref 0–40)

## 2014-09-26 NOTE — Telephone Encounter (Signed)
Patient is aware Carotid doppler shows no significant stenosis.

## 2014-09-26 NOTE — Telephone Encounter (Signed)
Patient is aware of Reviewed lipid panel. LDL isn't bad. It is 75 but ideally it should be less than 70. I would discuss with PCP if any further adjustments are possible.

## 2014-09-26 NOTE — Telephone Encounter (Signed)
-----   Message from Drema DallasAdam R Jaffe, DO sent at 09/26/2014  7:28 AM EDT ----- Reviewed lipid panel.  LDL isn't bad.  It is 75 but ideally it should be less than 70.  I would discuss with PCP if any further adjustments are possible. ----- Message -----    From: Lab in [facility] Interface    Sent: 09/26/2014   5:52 AM      To: Drema DallasAdam R Jaffe, DO

## 2014-09-26 NOTE — Patient Outreach (Signed)
[facility] Care Management  09/26/2014  Samantha RubensteinBarbara A [name] 10/20/1945 [id number]   Received notification from Colleen CanLinda Manning, RN to close case due to unable to contact patient.  Samantha [address] [facility] CM Assistant Phone: [phone] Fax: [phone]

## 2014-09-26 NOTE — Telephone Encounter (Signed)
-----   Message from Drema DallasAdam R Jaffe, DO sent at 09/25/2014 10:57 AM EDT ----- Carotid doppler shows no significant stenosis.  Did she have the lipid panel yet? ----- Message -----    From: Rad Results In Interface    Sent: 09/25/2014  10:03 AM      To: Drema DallasAdam R Jaffe, DO

## 2014-10-18 ENCOUNTER — Emergency Department (HOSPITAL_COMMUNITY): Payer: Commercial Managed Care - HMO

## 2014-10-18 ENCOUNTER — Emergency Department (HOSPITAL_COMMUNITY)
Admission: EM | Admit: 2014-10-18 | Discharge: 2014-10-19 | Disposition: A | Discharge status: Home / Self Care | Payer: Commercial Managed Care - HMO | Attending: Emergency Medicine | Admitting: Emergency Medicine

## 2014-10-18 ENCOUNTER — Encounter (HOSPITAL_COMMUNITY): Payer: Self-pay | Admitting: *Deleted

## 2014-10-18 DIAGNOSIS — R0602 Shortness of breath: Secondary | ICD-10-CM | POA: Diagnosis present

## 2014-10-18 DIAGNOSIS — E119 Type 2 diabetes mellitus without complications: Secondary | ICD-10-CM | POA: Insufficient documentation

## 2014-10-18 DIAGNOSIS — J441 Chronic obstructive pulmonary disease with (acute) exacerbation: Secondary | ICD-10-CM | POA: Diagnosis not present

## 2014-10-18 DIAGNOSIS — Z79899 Other long term (current) drug therapy: Secondary | ICD-10-CM | POA: Diagnosis not present

## 2014-10-18 DIAGNOSIS — Z87891 Personal history of nicotine dependence: Secondary | ICD-10-CM | POA: Diagnosis not present

## 2014-10-18 DIAGNOSIS — Z7982 Long term (current) use of aspirin: Secondary | ICD-10-CM | POA: Insufficient documentation

## 2014-10-18 DIAGNOSIS — I509 Heart failure, unspecified: Secondary | ICD-10-CM | POA: Insufficient documentation

## 2014-10-18 DIAGNOSIS — Z87448 Personal history of other diseases of urinary system: Secondary | ICD-10-CM | POA: Insufficient documentation

## 2014-10-18 DIAGNOSIS — J40 Bronchitis, not specified as acute or chronic: Secondary | ICD-10-CM

## 2014-10-18 DIAGNOSIS — I671 Cerebral aneurysm, nonruptured: Secondary | ICD-10-CM | POA: Diagnosis not present

## 2014-10-18 HISTORY — DX: Type 2 diabetes mellitus without complications: E11.9

## 2014-10-18 HISTORY — DX: Chronic obstructive pulmonary disease, unspecified: J44.9

## 2014-10-18 HISTORY — DX: Disorder of kidney and ureter, unspecified: N28.9

## 2014-10-18 LAB — CBC WITH DIFFERENTIAL/PLATELET
Basophils Absolute: 0 10*3/uL (ref 0.0–0.1)
Basophils Relative: 0 % (ref 0–1)
EOS ABS: 0.3 10*3/uL (ref 0.0–0.7)
Eosinophils Relative: 3 % (ref 0–5)
HCT: 35.2 % — ABNORMAL LOW (ref 36.0–46.0)
Hemoglobin: 10.9 g/dL — ABNORMAL LOW (ref 12.0–15.0)
LYMPHS ABS: 2.5 10*3/uL (ref 0.7–4.0)
LYMPHS PCT: 27 % (ref 12–46)
MCH: 26.7 pg (ref 26.0–34.0)
MCHC: 31 g/dL (ref 30.0–36.0)
MCV: 86.3 fL (ref 78.0–100.0)
Monocytes Absolute: 0.6 10*3/uL (ref 0.1–1.0)
Monocytes Relative: 6 % (ref 3–12)
NEUTROS PCT: 64 % (ref 43–77)
Neutro Abs: 5.8 10*3/uL (ref 1.7–7.7)
Platelets: 249 10*3/uL (ref 150–400)
RBC: 4.08 MIL/uL (ref 3.87–5.11)
RDW: 15.9 % — AB (ref 11.5–15.5)
WBC: 9.1 10*3/uL (ref 4.0–10.5)

## 2014-10-18 LAB — COMPREHENSIVE METABOLIC PANEL
ALBUMIN: 3.4 g/dL — AB (ref 3.5–5.0)
ALT: 10 U/L — ABNORMAL LOW (ref 14–54)
ANION GAP: 10 (ref 5–15)
AST: 14 U/L — ABNORMAL LOW (ref 15–41)
Alkaline Phosphatase: 76 U/L (ref 38–126)
BUN: 29 mg/dL — AB (ref 6–20)
CALCIUM: 9.2 mg/dL (ref 8.9–10.3)
CO2: 28 mmol/L (ref 22–32)
Chloride: 103 mmol/L (ref 101–111)
Creatinine, Ser: 1.31 mg/dL — ABNORMAL HIGH (ref 0.44–1.00)
GFR calc non Af Amer: 41 mL/min — ABNORMAL LOW (ref >60)
GFR, EST AFRICAN AMERICAN: 47 mL/min — AB (ref >60)
GLUCOSE: 114 mg/dL — AB (ref 65–99)
POTASSIUM: 4.1 mmol/L (ref 3.5–5.1)
Sodium: 141 mmol/L (ref 135–145)
TOTAL PROTEIN: 7.3 g/dL (ref 6.5–8.1)
Total Bilirubin: 0.4 mg/dL (ref 0.3–1.2)

## 2014-10-18 LAB — BRAIN NATRIURETIC PEPTIDE: B NATRIURETIC PEPTIDE 5: 53 pg/mL (ref 0.0–100.0)

## 2014-10-18 LAB — TROPONIN I: Troponin I: <0.03 ng/mL (ref <0.031)

## 2014-10-18 MED ORDER — AZITHROMYCIN 250 MG PO TABS: ORAL_TABLET | ORAL | Status: DC

## 2014-10-18 MED ORDER — AZITHROMYCIN 250 MG PO TABS
500.0000 mg | ORAL_TABLET | Freq: Once | ORAL | Status: AC
  Administered 2014-10-18: 500 mg via ORAL
  Filled 2014-10-18: qty 2

## 2014-10-18 NOTE — Discharge Instructions (Signed)
Follow up with your md Friday as planned °

## 2014-10-18 NOTE — ED Notes (Signed)
Notified [facility] of patient needing transportation back home. 

## 2014-10-18 NOTE — ED Notes (Signed)
Pt comes in by EMS from home. Pt began having SOB started 3 days ago. Pt has lower extremity swelling. Pt has a home health nurse who made an in home visit today when they found the patient with an oxygen saturation of 85%. Pt was placed on home oxygen at 2L and oxygen went up to 93%

## 2014-10-18 NOTE — ED Provider Notes (Signed)
CSN: [id number]     Arrival date & time 10/18/14  1735 History   First MD Initiated Contact with Patient 10/18/14 1742     Chief Complaint  Patient presents with  . Shortness of Breath     (Consider location/radiation/quality/duration/timing/severity/associated sxs/prior Treatment) Patient is a 69 y.o. female presenting with shortness of breath. The history is provided by the patient (the pt complains of some cough).  Shortness of Breath Severity:  Mild Onset quality:  Sudden Timing:  Intermittent Progression:  Waxing and waning Chronicity:  New Context: activity   Associated symptoms: no abdominal pain, no chest pain, no cough, no headaches and no rash     Past Medical History  Diagnosis Date  . COPD (chronic obstructive pulmonary disease)   . CHF (congestive heart failure)   . Brain aneurysm 2004  . Diabetes mellitus without complication   . Renal disorder    Past Surgical History  Procedure Laterality Date  . Cholecystectomy    . Appendectomy    . Back surgery  1970   No family history on file. History  Substance Use Topics  . Smoking status: Former Smoker    Quit date: 06/02/2002  . Smokeless tobacco: Not on file  . Alcohol Use: No   OB History    No data available     Review of Systems  Constitutional: Negative for appetite change and fatigue.  HENT: Negative for congestion, ear discharge and sinus pressure.   Eyes: Negative for discharge.  Respiratory: Positive for shortness of breath. Negative for cough.   Cardiovascular: Negative for chest pain.  Gastrointestinal: Negative for abdominal pain and diarrhea.  Genitourinary: Negative for frequency and hematuria.  Musculoskeletal: Negative for back pain.  Skin: Negative for rash.  Neurological: Negative for seizures and headaches.  Psychiatric/Behavioral: Negative for hallucinations.      Allergies  Augmentin; Clarithromycin; Clindamycin/lincomycin; Levaquin; Metronidazole; Prevacid; and  Sulfamethoxazole-trimethoprim  Home Medications   Prior to Admission medications   Medication Sig Start Date End Date Taking? Authorizing Provider  aspirin EC 81 MG tablet Take 81 mg by mouth daily.   Yes Historical Provider, MD  BREO ELLIPTA 100-25 MCG/INH AEPB Inhale 1 puff into the lungs daily. 10/02/14  Yes Historical Provider, MD  carvedilol (COREG) 25 MG tablet Take 25 mg by mouth 2 (two) times daily with a meal.   Yes Historical Provider, MD  ferrous sulfate 325 (65 FE) MG tablet Take 325 mg by mouth daily.   Yes Historical Provider, MD  folic acid (FOLVITE) 1 MG tablet Take 1 mg by mouth daily.   Yes Historical Provider, MD  furosemide (LASIX) 40 MG tablet Take 40 mg by mouth at bedtime as needed for fluid or edema.  09/08/14  Yes Historical Provider, MD  gabapentin (NEURONTIN) 100 MG capsule Take 100 mg by mouth 2 (two) times daily. 08/22/14  Yes Historical Provider, MD  glipiZIDE (GLUCOTROL) 10 MG tablet Take 2 mg by mouth daily as needed. Only takes if blood sugar is over 120   Yes Historical Provider, MD  levalbuterol (XOPENEX) 0.63 MG/3ML nebulizer solution Take 3 mLs by nebulization every 6 (six) hours as needed for wheezing or shortness of breath.  10/18/14  Yes Historical Provider, MD  levothyroxine (SYNTHROID, LEVOTHROID) 112 MCG tablet Take 112 mcg by mouth daily before breakfast.   Yes Historical Provider, MD  niacin 500 MG tablet Take 500 mg by mouth at bedtime.   Yes Historical Provider, MD  potassium chloride SA (K-DUR,KLOR-CON) 20 MEQ tablet  Take 20 mEq by mouth 2 (two) times daily. 08/15/14  Yes Historical Provider, MD  pravastatin (PRAVACHOL) 80 MG tablet Take 80 mg by mouth at bedtime. 08/22/14  Yes Historical Provider, MD  ranitidine (ZANTAC) 150 MG tablet Take 150 mg by mouth at bedtime. 09/11/14  Yes Historical Provider, MD  SPIRIVA HANDIHALER 18 MCG inhalation capsule Take 18 mcg by mouth daily. 10/05/14  Yes Historical Provider, MD  traMADol (ULTRAM) 50 MG tablet Take 100 mg  by mouth 2 (two) times daily. 09/08/14  Yes Historical Provider, MD  ZETIA 10 MG tablet Take 10 mg by mouth at bedtime. 09/14/14  Yes Historical Provider, MD  azithromycin (ZITHROMAX) 250 MG tablet Take one tablet a day 10/18/14   Bethann BerkshireJoseph Jevaun Strick, MD   BP 120/59 mmHg  Pulse 73  Temp(Src) 97.6 F (36.4 C) (Oral)  Resp 19  Ht 5\' 10"  (1.778 m)  Wt 260 lb (117.935 kg)  BMI 37.31 kg/m2  SpO2 99% Physical Exam  Constitutional: She is oriented to person, place, and time. She appears well-developed.  HENT:  Head: Normocephalic.  Eyes: Conjunctivae and EOM are normal. No scleral icterus.  Neck: Neck supple. No thyromegaly present.  Cardiovascular: Normal rate and regular rhythm.  Exam reveals no gallop and no friction rub.   No murmur heard. Pulmonary/Chest: No stridor. She has no wheezes. She has no rales. She exhibits no tenderness.  Few crackles  Abdominal: She exhibits no distension. There is no tenderness. There is no rebound.  Musculoskeletal: Normal range of motion. She exhibits no edema.  Lymphadenopathy:    She has no cervical adenopathy.  Neurological: She is oriented to person, place, and time. She exhibits normal muscle tone. Coordination normal.  Skin: No rash noted. No erythema.  Psychiatric: She has a normal mood and affect. Her behavior is normal.    ED Course  Procedures (including critical care time) Labs Review Labs Reviewed  CBC WITH DIFFERENTIAL/PLATELET - Abnormal; Notable for the following:    Hemoglobin 10.9 (*)    HCT 35.2 (*)    RDW 15.9 (*)    All other components within normal limits  COMPREHENSIVE METABOLIC PANEL - Abnormal; Notable for the following:    Glucose, Bld 114 (*)    BUN 29 (*)    Creatinine, Ser 1.31 (*)    Albumin 3.4 (*)    AST 14 (*)    ALT 10 (*)    GFR calc non Af Amer 41 (*)    GFR calc Af Amer 47 (*)    All other components within normal limits  BRAIN NATRIURETIC PEPTIDE  TROPONIN I    Imaging Review Dg Chest Portable 1  View  10/18/2014   CLINICAL DATA:  Shortness of breath beginning 3 days ago, lower extremity swelling, oxygen desaturation, history COPD, CHF, diabetes, former smoker, renal disorder  EXAM: PORTABLE CHEST - 1 VIEW  COMPARISON:  Portable exam 1813 hours without priors for comparison  FINDINGS: Upper normal heart size.  Mediastinal contours and pulmonary vascularity normal.  Bibasilar atelectasis with mild elevation of LEFT diaphragm.  No pleural effusion or pneumothorax.  IMPRESSION: Decreased lung volumes with bibasilar atelectasis.   Electronically Signed   By: Ulyses SouthwardMark  Boles M.D.   On: 10/18/2014 18:30     EKG Interpretation None      MDM   Final diagnoses:  Bronchitis    Pt is to see her pcp Friday.  She has o2 at home is on 2 liters sat 97%,  She has  had lung crackles for months.  Will start zpak empirically     Bethann BerkshireJoseph Sephiroth Mcluckie, MD 10/18/14 2315

## 2014-10-20 ENCOUNTER — Encounter: Payer: Self-pay | Admitting: Neurology

## 2014-11-10 ENCOUNTER — Telehealth (HOSPITAL_COMMUNITY): Payer: Self-pay | Admitting: *Deleted

## 2014-11-10 NOTE — Telephone Encounter (Signed)
The only outstanding doppler order that I see is for carotid dopplers and they were not ordered by Dr Allyson Sabal.

## 2014-11-10 NOTE — Telephone Encounter (Signed)
Pt is confined to a wheel chair and is unable to get onto the table to have her dopplers done. Do you want this scheduled at the hospital?

## 2014-11-17 ENCOUNTER — Emergency Department (HOSPITAL_COMMUNITY)
Admission: EM | Admit: 2014-11-17 | Discharge: 2014-11-17 | Disposition: A | Discharge status: Home / Self Care | Payer: Commercial Managed Care - HMO | Attending: Emergency Medicine | Admitting: Emergency Medicine

## 2014-11-17 ENCOUNTER — Encounter (HOSPITAL_COMMUNITY): Payer: Self-pay

## 2014-11-17 ENCOUNTER — Emergency Department (HOSPITAL_COMMUNITY): Payer: Commercial Managed Care - HMO

## 2014-11-17 DIAGNOSIS — R109 Unspecified abdominal pain: Secondary | ICD-10-CM

## 2014-11-17 DIAGNOSIS — M1990 Unspecified osteoarthritis, unspecified site: Secondary | ICD-10-CM | POA: Insufficient documentation

## 2014-11-17 DIAGNOSIS — Z79899 Other long term (current) drug therapy: Secondary | ICD-10-CM | POA: Diagnosis not present

## 2014-11-17 DIAGNOSIS — I10 Essential (primary) hypertension: Secondary | ICD-10-CM | POA: Diagnosis not present

## 2014-11-17 DIAGNOSIS — R1031 Right lower quadrant pain: Secondary | ICD-10-CM | POA: Diagnosis present

## 2014-11-17 DIAGNOSIS — E119 Type 2 diabetes mellitus without complications: Secondary | ICD-10-CM | POA: Diagnosis not present

## 2014-11-17 DIAGNOSIS — Z9889 Other specified postprocedural states: Secondary | ICD-10-CM | POA: Diagnosis not present

## 2014-11-17 DIAGNOSIS — Z87891 Personal history of nicotine dependence: Secondary | ICD-10-CM | POA: Diagnosis not present

## 2014-11-17 DIAGNOSIS — N390 Urinary tract infection, site not specified: Secondary | ICD-10-CM | POA: Diagnosis not present

## 2014-11-17 DIAGNOSIS — E079 Disorder of thyroid, unspecified: Secondary | ICD-10-CM | POA: Diagnosis not present

## 2014-11-17 DIAGNOSIS — N201 Calculus of ureter: Secondary | ICD-10-CM | POA: Insufficient documentation

## 2014-11-17 DIAGNOSIS — E785 Hyperlipidemia, unspecified: Secondary | ICD-10-CM | POA: Diagnosis not present

## 2014-11-17 DIAGNOSIS — Z7982 Long term (current) use of aspirin: Secondary | ICD-10-CM | POA: Insufficient documentation

## 2014-11-17 DIAGNOSIS — Z880 Allergy status to penicillin: Secondary | ICD-10-CM | POA: Insufficient documentation

## 2014-11-17 DIAGNOSIS — J449 Chronic obstructive pulmonary disease, unspecified: Secondary | ICD-10-CM | POA: Diagnosis not present

## 2014-11-17 DIAGNOSIS — K7689 Other specified diseases of liver: Secondary | ICD-10-CM | POA: Insufficient documentation

## 2014-11-17 DIAGNOSIS — I509 Heart failure, unspecified: Secondary | ICD-10-CM | POA: Insufficient documentation

## 2014-11-17 DIAGNOSIS — E669 Obesity, unspecified: Secondary | ICD-10-CM | POA: Insufficient documentation

## 2014-11-17 DIAGNOSIS — D649 Anemia, unspecified: Secondary | ICD-10-CM | POA: Diagnosis not present

## 2014-11-17 DIAGNOSIS — E860 Dehydration: Secondary | ICD-10-CM

## 2014-11-17 DIAGNOSIS — G629 Polyneuropathy, unspecified: Secondary | ICD-10-CM | POA: Diagnosis not present

## 2014-11-17 DIAGNOSIS — K769 Liver disease, unspecified: Secondary | ICD-10-CM

## 2014-11-17 LAB — CBC WITH DIFFERENTIAL/PLATELET
Basophils Absolute: 0 10*3/uL (ref 0.0–0.1)
Basophils Relative: 0 % (ref 0–1)
Eosinophils Absolute: 0.3 10*3/uL (ref 0.0–0.7)
Eosinophils Relative: 3 % (ref 0–5)
HCT: 37.3 % (ref 36.0–46.0)
Hemoglobin: 11.4 g/dL — ABNORMAL LOW (ref 12.0–15.0)
Lymphocytes Relative: 16 % (ref 12–46)
Lymphs Abs: 1.6 10*3/uL (ref 0.7–4.0)
MCH: 26.7 pg (ref 26.0–34.0)
MCHC: 30.6 g/dL (ref 30.0–36.0)
MCV: 87.4 fL (ref 78.0–100.0)
MONO ABS: 0.6 10*3/uL (ref 0.1–1.0)
Monocytes Relative: 6 % (ref 3–12)
Neutro Abs: 8.1 10*3/uL — ABNORMAL HIGH (ref 1.7–7.7)
Neutrophils Relative %: 75 % (ref 43–77)
Platelets: 259 10*3/uL (ref 150–400)
RBC: 4.27 MIL/uL (ref 3.87–5.11)
RDW: 15.7 % — AB (ref 11.5–15.5)
WBC: 10.6 10*3/uL — AB (ref 4.0–10.5)

## 2014-11-17 LAB — COMPREHENSIVE METABOLIC PANEL
ALK PHOS: 73 U/L (ref 38–126)
ALT: 11 U/L — ABNORMAL LOW (ref 14–54)
AST: 13 U/L — ABNORMAL LOW (ref 15–41)
Albumin: 3.5 g/dL (ref 3.5–5.0)
Anion gap: 8 (ref 5–15)
BUN: 23 mg/dL — AB (ref 6–20)
CO2: 30 mmol/L (ref 22–32)
Calcium: 9.2 mg/dL (ref 8.9–10.3)
Chloride: 103 mmol/L (ref 101–111)
Creatinine, Ser: 2.03 mg/dL — ABNORMAL HIGH (ref 0.44–1.00)
GFR, EST AFRICAN AMERICAN: 28 mL/min — AB (ref >60)
GFR, EST NON AFRICAN AMERICAN: 24 mL/min — AB (ref >60)
GLUCOSE: 136 mg/dL — AB (ref 65–99)
POTASSIUM: 3.5 mmol/L (ref 3.5–5.1)
Sodium: 141 mmol/L (ref 135–145)
Total Bilirubin: 0.5 mg/dL (ref 0.3–1.2)
Total Protein: 7.7 g/dL (ref 6.5–8.1)

## 2014-11-17 LAB — URINE MICROSCOPIC-ADD ON

## 2014-11-17 LAB — URINALYSIS, ROUTINE W REFLEX MICROSCOPIC
Bilirubin Urine: NEGATIVE
Glucose, UA: NEGATIVE mg/dL
Ketones, ur: NEGATIVE mg/dL
NITRITE: NEGATIVE
PH: 5.5 (ref 5.0–8.0)
Protein, ur: 30 mg/dL — AB
Urobilinogen, UA: 0.2 mg/dL (ref 0.0–1.0)

## 2014-11-17 LAB — LIPASE, BLOOD: LIPASE: 20 U/L — AB (ref 22–51)

## 2014-11-17 MED ORDER — IOHEXOL 300 MG/ML  SOLN
50.0000 mL | Freq: Once | INTRAMUSCULAR | Status: AC | PRN
  Administered 2014-11-17: 50 mL via ORAL

## 2014-11-17 MED ORDER — SODIUM CHLORIDE 0.9 % IV BOLUS (SEPSIS)
500.0000 mL | Freq: Once | INTRAVENOUS | Status: AC
  Administered 2014-11-17: 500 mL via INTRAVENOUS

## 2014-11-17 MED ORDER — MORPHINE SULFATE 4 MG/ML IJ SOLN
4.0000 mg | Freq: Once | INTRAMUSCULAR | Status: AC
  Administered 2014-11-17: 4 mg via INTRAVENOUS
  Filled 2014-11-17: qty 1

## 2014-11-17 MED ORDER — ONDANSETRON HCL 4 MG/2ML IJ SOLN
4.0000 mg | Freq: Once | INTRAMUSCULAR | Status: AC
  Administered 2014-11-17: 4 mg via INTRAVENOUS
  Filled 2014-11-17: qty 2

## 2014-11-17 MED ORDER — ONDANSETRON 8 MG PO TBDP: 8.0000 mg | ORAL_TABLET | Freq: Three times a day (TID) | ORAL | Status: AC | PRN

## 2014-11-17 MED ORDER — CEPHALEXIN 250 MG/5ML PO SUSR: 500.0000 mg | Freq: Two times a day (BID) | ORAL | Status: AC

## 2014-11-17 MED ORDER — HYDROCODONE-ACETAMINOPHEN 7.5-325 MG/15ML PO SOLN: ORAL | Status: DC

## 2014-11-17 MED ORDER — DEXTROSE 5 % IV SOLN
1.0000 g | Freq: Once | INTRAVENOUS | Status: AC
  Administered 2014-11-17: 1 g via INTRAVENOUS
  Filled 2014-11-17: qty 10

## 2014-11-17 MED ORDER — IOHEXOL 300 MG/ML  SOLN: 100.0000 mL | Freq: Once | INTRAMUSCULAR | Status: DC | PRN

## 2014-11-17 MED ORDER — FENTANYL CITRATE (PF) 100 MCG/2ML IJ SOLN
50.0000 ug | Freq: Once | INTRAMUSCULAR | Status: AC
  Administered 2014-11-17: 50 ug via INTRAVENOUS
  Filled 2014-11-17: qty 2

## 2014-11-17 NOTE — ED Notes (Signed)
Patient states that her pain is increasing since she returned from CT. MD notified.

## 2014-11-17 NOTE — Discharge Instructions (Signed)
PLEASE HAVE MRI OF YOUR LIVER AS WE DISCUSSED TO EVALUATE THE LESION IN YOUR LIVER

## 2014-11-17 NOTE — ED Provider Notes (Signed)
CSN: [id number]     Arrival date & time 11/17/14  0356 History   First MD Initiated Contact with Patient 11/17/14 0405     Chief Complaint  Patient presents with  . Abdominal Pain     Patient is a 69 y.o. female presenting with abdominal pain. The history is provided by the patient and a significant other.  Abdominal Pain Pain location:  RLQ Pain quality: aching   Pain radiates to:  Does not radiate Pain severity:  Moderate Onset quality:  Gradual Duration: "several days" Timing:  Constant Progression:  Worsening Chronicity:  New Relieved by:  None tried Worsened by:  Movement and palpation Associated symptoms: no chest pain, no diarrhea, no dysuria, no fever and no vomiting   Pt reports RLQ abd pain for several days She reports having BM but no diarrhea She has not had this pain recently  She reports generalized weakness ever since her cholecystectomy last year She also has h/o appendectomy  Past Medical History  Diagnosis Date  . Brain aneurysm   . Neuropathy   . Diabetes mellitus   . Anemia   . Thyroid disease   . Arthritis   . Macular degeneration   . Chest pain   . Hypertension   . Hyperlipidemia   . Obesity   . Abnormal stress test     -false positive, prob breast attenuation  . Hx of cardiac catheterization 11/2013    normal coronary arteries  . Sleep apnea     doesn't wear CPAP, sleeps in a lift chair  . Difficult intubation 02/08/14    re-intubated in PACU  . COPD (chronic obstructive pulmonary disease)   . CHF (congestive heart failure)   . Brain aneurysm 2004  . Diabetes mellitus without complication   . Renal disorder    Past Surgical History  Procedure Laterality Date  . Tonsillectomy    . Lower back    . Heel spurs    . Hammer toes    . Right knee sark    . Cerebral aneurysm repair    . Cardiac catheterization  12/22/13    done for "angina" and abnormal nuc study- normal coronary arteries  . Cardiac catheterization  2004    insignificant  CAD  . Cholecystectomy N/A 02/08/2014    Procedure: LAPAROSCOPIC CHOLECYSTECTOMY;  Surgeon: Dalia Heading, MD;  Location: [facility];  Service: General;  Laterality: N/A;  . Esophagogastroduodenoscopy  2007    Dr. Jena Gauss: mottled patchy erythema of stomach, small bowel bx negative  . Left heart catheterization with coronary angiogram N/A 12/22/2013    Procedure: LEFT HEART CATHETERIZATION WITH CORONARY ANGIOGRAM;  Surgeon: Runell Gess, MD;  Location: [facility] CATH LAB;  Service: Cardiovascular;  Laterality: N/A;  . Transthoracic echocardiogram  10/05/2012    normal EF  . Doppler echocardiography  09/27/2010    EF>55%  . Nm myoview ltd  08/26/2011    EF 51% Low risk scan  . Cardiac catheterization  11/16/2002  . Lower ext arterial dopplers   11/11/2012    Abnormal dopplers  . Lower ext arterial dopplers  04/10/2011    abnormal dopplers  . Carotid duplex  04/10/2011    mildly abnormal carotid duplex  . Abdominal aortogram  05/07/2006  . Sleep study  08/10/2011  . Cpap report  09/03/2011  . Cholecystectomy    . Appendectomy    . Back surgery  1970   Family History  Problem Relation Age of Onset  . Diabetes  family history   . Arthritis      family history   . Diabetes Mother   . Hypertension Mother   . Seizures Sister   . Colon cancer Neg Hx    History  Substance Use Topics  . Smoking status: Former Smoker    Quit date: 06/02/2002  . Smokeless tobacco: Not on file  . Alcohol Use: No   OB History    Gravida Para Term Preterm AB TAB SAB Ectopic Multiple Living   0 0 0 0 0 0 0 0       Review of Systems  Constitutional: Negative for fever.  Cardiovascular: Negative for chest pain.  Gastrointestinal: Positive for abdominal pain. Negative for vomiting and diarrhea.  Genitourinary: Negative for dysuria.  Musculoskeletal: Negative for back pain.  All other systems reviewed and are negative.     Allergies  Clarithromycin; Levofloxacin; Metronidazole; Sulfonamide derivatives;  Amoxicillin-pot clavulanate; Amoxicillin-pot clavulanate; Augmentin; Clarithromycin; Clindamycin/lincomycin; Clindamycin/lincomycin; Lansoprazole; Levaquin; Metronidazole; and Sulfamethoxazole-trimethoprim  Home Medications   Prior to Admission medications   Medication Sig Start Date End Date Taking? Authorizing Provider  aspirin 81 MG tablet Take 162 mg by mouth daily.    Yes Historical Provider, MD  BREO ELLIPTA 100-25 MCG/INH AEPB Inhale 1 puff into the lungs daily. 10/02/14  Yes Historical Provider, MD  carvedilol (COREG) 25 MG tablet Take 1 tablet (25 mg total) by mouth 2 (two) times daily with a meal. 02/15/14  Yes Runell Gess, MD  carvedilol (COREG) 25 MG tablet Take 25 mg by mouth 2 (two) times daily with a meal.   Yes Historical Provider, MD  Coenzyme Q10 (CO Q 10) 100 MG CAPS Take 100 mg by mouth 2 (two) times daily.    Yes Historical Provider, MD  ferrous sulfate 325 (65 FE) MG tablet Take 325 mg by mouth daily.   Yes Historical Provider, MD  folic acid (FOLVITE) 1 MG tablet Take 1 tablet (1 mg total) by mouth daily. 12/20/13  Yes Runell Gess, MD  folic acid (FOLVITE) 1 MG tablet Take 1 mg by mouth daily.   Yes Historical Provider, MD  furosemide (LASIX) 40 MG tablet Take 80 mg by mouth daily. 07/04/13  Yes Runell Gess, MD  gabapentin (NEURONTIN) 100 MG capsule Take 100 mg by mouth 3 (three) times daily.   Yes Historical Provider, MD  glipiZIDE (GLUCOTROL) 10 MG tablet Take 2.5 mg by mouth daily as needed (for high blood sugar).   Yes Historical Provider, MD  IRON PO Take 365 mg by mouth daily.    Yes Historical Provider, MD  levalbuterol (XOPENEX) 0.63 MG/3ML nebulizer solution Take 3 mLs by nebulization every 6 (six) hours as needed for wheezing or shortness of breath.  10/18/14  Yes Historical Provider, MD  levothyroxine (SYNTHROID, LEVOTHROID) 112 MCG tablet Take 112 mcg by mouth daily before breakfast.   Yes Historical Provider, MD  potassium chloride SA (K-DUR,KLOR-CON)  20 MEQ tablet Take 20 mEq by mouth 2 (two) times daily. 08/15/14  Yes Historical Provider, MD  pravastatin (PRAVACHOL) 80 MG tablet Take 1 tablet (80 mg total) by mouth daily. 11/25/12  Yes Susa Griffins, MD  ranitidine (ZANTAC) 150 MG tablet Take 150 mg by mouth at bedtime. 09/11/14  Yes Historical Provider, MD  SPIRIVA HANDIHALER 18 MCG inhalation capsule Take 18 mcg by mouth daily. 10/05/14  Yes Historical Provider, MD  traMADol (ULTRAM) 50 MG tablet Take 100 mg by mouth 2 (two) times daily. 09/08/14  Yes Historical Provider,  MD  ZETIA 10 MG tablet TAKE ONE TABLET BY MOUTH ONCE DAILY. 09/14/14  Yes Runell Gess, MD  aspirin EC 81 MG tablet Take 81 mg by mouth daily.    Historical Provider, MD  azithromycin (ZITHROMAX) 250 MG tablet Take one tablet a day 10/18/14   Bethann Berkshire, MD  furosemide (LASIX) 40 MG tablet Take 40 mg by mouth at bedtime as needed for fluid or edema.  09/08/14   Historical Provider, MD  gabapentin (NEURONTIN) 100 MG capsule Take 100 mg by mouth 2 (two) times daily. 08/22/14   Historical Provider, MD  glipiZIDE (GLUCOTROL) 10 MG tablet Take 2 mg by mouth daily as needed. Only takes if blood sugar is over 120    Historical Provider, MD  levothyroxine (SYNTHROID, LEVOTHROID) 112 MCG tablet Take 112 mcg by mouth daily before breakfast.    Historical Provider, MD  niacin 500 MG tablet Take 500 mg by mouth at bedtime.     Historical Provider, MD  niacin 500 MG tablet Take 500 mg by mouth at bedtime.    Historical Provider, MD  pravastatin (PRAVACHOL) 80 MG tablet Take 80 mg by mouth at bedtime. 08/22/14   Historical Provider, MD  traMADol (ULTRAM) 50 MG tablet Take 100 mg by mouth 3 (three) times daily. Takes 2 tablets 4 times daily. 02/23/12   Vickki Hearing, MD  ZETIA 10 MG tablet Take 10 mg by mouth at bedtime. 09/14/14   Historical Provider, MD   BP 128/89 mmHg  Pulse 72  Temp(Src) 97.9 F (36.6 C) (Oral)  Resp 20  Ht 5' 10.5" (1.791 m)  Wt 260 lb (117.935 kg)  BMI  36.77 kg/m2  SpO2 93% Physical Exam CONSTITUTIONAL: Well developed/well nourished HEAD: Normocephalic/atraumatic EYES: EOMI ENMT: Mucous membranes moist NECK: supple no meningeal signs SPINE/BACK:entire spine nontender CV: S1/S2 noted, no murmurs/rubs/gallops noted LUNGS: Lungs are clear to auscultation bilaterally, no apparent distress ABDOMEN: soft, moderate RLQ tenderness, no hernia noted, no rebound or guarding, bowel sounds noted throughout abdomen GU:no cva tenderness NEURO: Pt is awake/alert/appropriate. She follows commands  EXTREMITIES: pulses normal/equal, full ROM SKIN: warm, color normal PSYCH: no abnormalities of mood noted, alert and oriented to situation  ED Course  Procedures  4:41 AM Pt with RLQ abd tenderness. She has multiple medical conditions.  I am concerned for acute abdominal emergency.  CT imaging ordered Pt/family agreeable with plan 6:46 AM CT scan reveals ureteral stone Pt still with pain She has bacteruria Urine culture ordered Will give rocephin (only allergy to amox is heart palpitations) She may need urology consultation 7:52 AM D/W DR [facility] WITH UROLOGY WE DISCUSSED LABS (UTI, CREAT AT 2) AND ALSO URETERAL STONE PT IS IMPROVED SHE IS NOT SEPTIC APPEARING PER UROLOGY, SHE CAN GO HOME TO F/U OR CAN BE ADMITTED FOR OBSERVATION/MONITORING FOR 24 HRS.  SHE DOES NOT REQUIRE EMERGENT STENT PLACEMENT PT PREFERS TO GO HOME SHE WILL BE STARTED ON ORAL KEFLEX (PREFERS LIQUID MEDS DUE TO DYSPHAGIA) SHE WILL RETURN FOR ANY FEVER, VOMITING OR IF PAIN WORSENS OVER NEXT 24 HOURS DISCUSSED NEED FOR UROLOGY FOLLOWUP ALSO ADVISED NEED FOR MRI LIVER PER CT RECS BP 147/81 mmHg  Pulse 62  Temp(Src) 97.7 F (36.5 C) (Oral)  Resp 18  Ht 5' 10.5" (1.791 m)  Wt 260 lb (117.935 kg)  BMI 36.77 kg/m2  SpO2 99% SHE TOLERATED ROCEPHIN HERE WILL TRANSITION TO KEFLEX Labs Review Labs Reviewed  COMPREHENSIVE METABOLIC PANEL - Abnormal; Notable for the  following:  Glucose, Bld 136 (*)    BUN 23 (*)    Creatinine, Ser 2.03 (*)    AST 13 (*)    ALT 11 (*)    GFR calc non Af Amer 24 (*)    GFR calc Af Amer 28 (*)    All other components within normal limits  CBC WITH DIFFERENTIAL/PLATELET - Abnormal; Notable for the following:    WBC 10.6 (*)    Hemoglobin 11.4 (*)    RDW 15.7 (*)    Neutro Abs 8.1 (*)    All other components within normal limits  LIPASE, BLOOD - Abnormal; Notable for the following:    Lipase 20 (*)    All other components within normal limits  URINALYSIS, ROUTINE W REFLEX MICROSCOPIC (NOT AT [facility]) - Abnormal; Notable for the following:    Color, Urine AMBER (*)    APPearance HAZY (*)    Specific Gravity, Urine >1.030 (*)    Hgb urine dipstick LARGE (*)    Protein, ur 30 (*)    Leukocytes, UA TRACE (*)    All other components within normal limits  URINE MICROSCOPIC-ADD ON - Abnormal; Notable for the following:    Bacteria, UA MANY (*)    Casts GRANULAR CAST (*)    Crystals CA OXALATE CRYSTALS (*)    All other components within normal limits  URINE CULTURE    Imaging Review Ct Abdomen Pelvis Wo Contrast  11/17/2014   CLINICAL DATA:  Lower right-side abdominal pain. Low back pain. Elevated creatinine.  EXAM: CT ABDOMEN AND PELVIS WITHOUT CONTRAST  TECHNIQUE: Multidetector CT imaging of the abdomen and pelvis was performed following the standard protocol without IV contrast.  COMPARISON:  03/16/2014  FINDINGS: Mild atelectasis in the lung bases.  Coronary artery calcifications.  Vague low-attenuation lesion demonstrated in segment 4 of the liver measuring about 1.5 cm diameter. This is difficult to characterize without contrast material. Suggest follow-up with MRI in the elective setting. Surgical absence of the gallbladder. No bile duct dilatation. Unenhanced appearance of the pancreas, spleen, inferior vena cava, and retroperitoneal lymph nodes is unremarkable. Prominent calcification of the abdominal aorta  without aneurysm. Multiple stones demonstrated in both kidneys. Largest stone on the left is in the renal pelvis and measures about 1.8 cm maximal diameter. Largest stone in the right is in the upper pole calyx and measures about 13 mm diameter. Stones at enlarged since previous study. Stone in the left renal pelvis has developed. 3 mm stone in the right proximal ureter at the ureteropelvic junction with mild proximal hydronephrosis and stranding around the right kidney. The distal ureter is decompressed. No additional ureteral stones demonstrated. The stomach, small bowel, and colon are not abnormally distended. No free air or free fluid in the abdomen. Small periumbilical hernia containing fat. 2.8 cm diameter right adrenal gland nodule with fat attenuation consistent with adenoma.  Pelvis: Bladder is decompressed. Uterus and ovaries are not enlarged. Appendix is normal. Rectosigmoid colon is not inflamed. No free or loculated pelvic fluid collections. No pelvic mass or lymphadenopathy. Degenerative changes in the spine. No destructive bone lesions.  IMPRESSION: 3 mm stone in the proximal right ureter with moderate proximal obstruction likely accounts for patient's right abdominal pain. Multiple bilateral intrarenal stones. Indeterminate lesion in segment 4 of the liver. Consider follow-up with elective MRI. Right adrenal gland adenoma. Small periumbilical hernia containing fat.   Electronically Signed   By: Burman Nieves M.D.   On: 11/17/2014 06:23  Medications  iohexol (OMNIPAQUE) 300 MG/ML solution 100 mL (not administered)  morphine 4 MG/ML injection 4 mg (not administered)  fentaNYL (SUBLIMAZE) injection 50 mcg (50 mcg Intravenous Given 11/17/14 0430)  ondansetron (ZOFRAN) injection 4 mg (4 mg Intravenous Given 11/17/14 0430)  iohexol (OMNIPAQUE) 300 MG/ML solution 50 mL (50 mLs Oral Contrast Given 11/17/14 0420)  fentaNYL (SUBLIMAZE) injection 50 mcg (50 mcg Intravenous Given 11/17/14 0515)   morphine 4 MG/ML injection 4 mg (4 mg Intravenous Given 11/17/14 0640)  ondansetron (ZOFRAN) injection 4 mg (4 mg Intravenous Given 11/17/14 0644)  cefTRIAXone (ROCEPHIN) 1 g in dextrose 5 % 50 mL IVPB (0 g Intravenous Stopped 11/17/14 0719)  sodium chloride 0.9 % bolus 500 mL (500 mLs Intravenous New Bag/Given 11/17/14 0743)    MDM   Final diagnoses:  Right ureteral stone  UTI (lower urinary tract infection)  Dehydration  Liver lesion    Nursing notes including past medical history and social history reviewed and considered in documentation Labs/vital reviewed myself and considered during evaluation     Zadie Rhine, MD 11/17/14 [phone]

## 2014-11-17 NOTE — ED Notes (Signed)
Lower right side abdominal pain. Denies nausea and vomiting. Had a small bowel movement yesterday.

## 2014-11-19 LAB — URINE CULTURE: Culture: NO GROWTH

## 2014-11-27 ENCOUNTER — Other Ambulatory Visit: Payer: Self-pay

## 2014-11-29 ENCOUNTER — Inpatient Hospital Stay (HOSPITAL_COMMUNITY)
Admission: EM | Admit: 2014-11-29 | Discharge: 2014-12-02 | DRG: 190 | Disposition: A | Discharge status: Skilled Nursing Facility | Payer: Commercial Managed Care - HMO | Attending: Internal Medicine | Admitting: Internal Medicine

## 2014-11-29 ENCOUNTER — Emergency Department (HOSPITAL_COMMUNITY): Payer: Commercial Managed Care - HMO

## 2014-11-29 ENCOUNTER — Encounter (HOSPITAL_COMMUNITY): Payer: Self-pay | Admitting: Emergency Medicine

## 2014-11-29 DIAGNOSIS — I129 Hypertensive chronic kidney disease with stage 1 through stage 4 chronic kidney disease, or unspecified chronic kidney disease: Secondary | ICD-10-CM | POA: Diagnosis present

## 2014-11-29 DIAGNOSIS — N390 Urinary tract infection, site not specified: Secondary | ICD-10-CM | POA: Diagnosis present

## 2014-11-29 DIAGNOSIS — I10 Essential (primary) hypertension: Secondary | ICD-10-CM | POA: Diagnosis present

## 2014-11-29 DIAGNOSIS — L03115 Cellulitis of right lower limb: Secondary | ICD-10-CM | POA: Insufficient documentation

## 2014-11-29 DIAGNOSIS — M7989 Other specified soft tissue disorders: Secondary | ICD-10-CM

## 2014-11-29 DIAGNOSIS — E43 Unspecified severe protein-calorie malnutrition: Secondary | ICD-10-CM | POA: Insufficient documentation

## 2014-11-29 DIAGNOSIS — E1149 Type 2 diabetes mellitus with other diabetic neurological complication: Secondary | ICD-10-CM

## 2014-11-29 DIAGNOSIS — H3530 Unspecified macular degeneration: Secondary | ICD-10-CM | POA: Diagnosis present

## 2014-11-29 DIAGNOSIS — E1142 Type 2 diabetes mellitus with diabetic polyneuropathy: Secondary | ICD-10-CM

## 2014-11-29 DIAGNOSIS — E119 Type 2 diabetes mellitus without complications: Secondary | ICD-10-CM

## 2014-11-29 DIAGNOSIS — Z7401 Bed confinement status: Secondary | ICD-10-CM

## 2014-11-29 DIAGNOSIS — Z6833 Body mass index (BMI) 33.0-33.9, adult: Secondary | ICD-10-CM

## 2014-11-29 DIAGNOSIS — I4581 Long QT syndrome: Secondary | ICD-10-CM | POA: Diagnosis present

## 2014-11-29 DIAGNOSIS — R0602 Shortness of breath: Secondary | ICD-10-CM | POA: Diagnosis not present

## 2014-11-29 DIAGNOSIS — L0390 Cellulitis, unspecified: Secondary | ICD-10-CM | POA: Diagnosis present

## 2014-11-29 DIAGNOSIS — N182 Chronic kidney disease, stage 2 (mild): Secondary | ICD-10-CM | POA: Diagnosis present

## 2014-11-29 DIAGNOSIS — M5136 Other intervertebral disc degeneration, lumbar region: Secondary | ICD-10-CM | POA: Diagnosis present

## 2014-11-29 DIAGNOSIS — J441 Chronic obstructive pulmonary disease with (acute) exacerbation: Secondary | ICD-10-CM | POA: Diagnosis not present

## 2014-11-29 DIAGNOSIS — Z9981 Dependence on supplemental oxygen: Secondary | ICD-10-CM

## 2014-11-29 DIAGNOSIS — Z8249 Family history of ischemic heart disease and other diseases of the circulatory system: Secondary | ICD-10-CM

## 2014-11-29 DIAGNOSIS — R29898 Other symptoms and signs involving the musculoskeletal system: Secondary | ICD-10-CM

## 2014-11-29 DIAGNOSIS — Z7982 Long term (current) use of aspirin: Secondary | ICD-10-CM

## 2014-11-29 DIAGNOSIS — Z87891 Personal history of nicotine dependence: Secondary | ICD-10-CM

## 2014-11-29 DIAGNOSIS — I509 Heart failure, unspecified: Secondary | ICD-10-CM

## 2014-11-29 DIAGNOSIS — E1140 Type 2 diabetes mellitus with diabetic neuropathy, unspecified: Secondary | ICD-10-CM | POA: Diagnosis present

## 2014-11-29 DIAGNOSIS — E46 Unspecified protein-calorie malnutrition: Secondary | ICD-10-CM | POA: Diagnosis present

## 2014-11-29 DIAGNOSIS — J9611 Chronic respiratory failure with hypoxia: Secondary | ICD-10-CM | POA: Diagnosis present

## 2014-11-29 DIAGNOSIS — Z833 Family history of diabetes mellitus: Secondary | ICD-10-CM

## 2014-11-29 DIAGNOSIS — I878 Other specified disorders of veins: Secondary | ICD-10-CM | POA: Diagnosis present

## 2014-11-29 DIAGNOSIS — N289 Disorder of kidney and ureter, unspecified: Secondary | ICD-10-CM

## 2014-11-29 HISTORY — DX: Unspecified protein-calorie malnutrition: E46

## 2014-11-29 LAB — CBC WITH DIFFERENTIAL/PLATELET
BASOS ABS: 0 10*3/uL (ref 0.0–0.1)
BASOS PCT: 0 % (ref 0–1)
EOS PCT: 2 % (ref 0–5)
Eosinophils Absolute: 0.2 10*3/uL (ref 0.0–0.7)
HCT: 37.3 % (ref 36.0–46.0)
Hemoglobin: 11.3 g/dL — ABNORMAL LOW (ref 12.0–15.0)
Lymphocytes Relative: 20 % (ref 12–46)
Lymphs Abs: 2.3 10*3/uL (ref 0.7–4.0)
MCH: 26.7 pg (ref 26.0–34.0)
MCHC: 30.3 g/dL (ref 30.0–36.0)
MCV: 88 fL (ref 78.0–100.0)
MONO ABS: 0.7 10*3/uL (ref 0.1–1.0)
Monocytes Relative: 7 % (ref 3–12)
Neutro Abs: 8 10*3/uL — ABNORMAL HIGH (ref 1.7–7.7)
Neutrophils Relative %: 71 % (ref 43–77)
Platelets: 278 10*3/uL (ref 150–400)
RBC: 4.24 MIL/uL (ref 3.87–5.11)
RDW: 15.3 % (ref 11.5–15.5)
WBC: 11.2 10*3/uL — ABNORMAL HIGH (ref 4.0–10.5)

## 2014-11-29 LAB — BASIC METABOLIC PANEL
Anion gap: 10 (ref 5–15)
BUN: 20 mg/dL (ref 6–20)
CALCIUM: 8.9 mg/dL (ref 8.9–10.3)
CO2: 30 mmol/L (ref 22–32)
Chloride: 102 mmol/L (ref 101–111)
Creatinine, Ser: 1.38 mg/dL — ABNORMAL HIGH (ref 0.44–1.00)
GFR calc Af Amer: 44 mL/min — ABNORMAL LOW (ref >60)
GFR, EST NON AFRICAN AMERICAN: 38 mL/min — AB (ref >60)
Glucose, Bld: 129 mg/dL — ABNORMAL HIGH (ref 65–99)
Potassium: 3.5 mmol/L (ref 3.5–5.1)
SODIUM: 142 mmol/L (ref 135–145)

## 2014-11-29 LAB — MAGNESIUM: Magnesium: 1.6 mg/dL — ABNORMAL LOW (ref 1.7–2.4)

## 2014-11-29 MED ORDER — SODIUM CHLORIDE 0.9 % IJ SOLN: 3.0000 mL | INTRAMUSCULAR | Status: DC | PRN

## 2014-11-29 MED ORDER — ACETAMINOPHEN 650 MG RE SUPP: 650.0000 mg | Freq: Four times a day (QID) | RECTAL | Status: DC | PRN

## 2014-11-29 MED ORDER — ALBUTEROL SULFATE (2.5 MG/3ML) 0.083% IN NEBU
5.0000 mg | INHALATION_SOLUTION | Freq: Once | RESPIRATORY_TRACT | Status: AC
  Administered 2014-11-29: 5 mg via RESPIRATORY_TRACT
  Filled 2014-11-29: qty 6

## 2014-11-29 MED ORDER — ACETAMINOPHEN 325 MG PO TABS: 650.0000 mg | ORAL_TABLET | Freq: Four times a day (QID) | ORAL | Status: DC | PRN

## 2014-11-29 MED ORDER — PREDNISOLONE 15 MG/5ML PO SOLN
ORAL | Status: AC
  Filled 2014-11-29: qty 4

## 2014-11-29 MED ORDER — METHYLPREDNISOLONE SODIUM SUCC 125 MG IJ SOLR: 125.0000 mg | Freq: Once | INTRAMUSCULAR | Status: DC

## 2014-11-29 MED ORDER — VANCOMYCIN HCL IN DEXTROSE 1-5 GM/200ML-% IV SOLN
1000.0000 mg | Freq: Once | INTRAVENOUS | Status: AC
  Administered 2014-11-29: 1000 mg via INTRAVENOUS
  Filled 2014-11-29: qty 200

## 2014-11-29 MED ORDER — LEVOTHYROXINE SODIUM 112 MCG PO TABS
112.0000 ug | ORAL_TABLET | Freq: Every day | ORAL | Status: DC
  Administered 2014-11-30 – 2014-12-02 (×3): 112 ug via ORAL
  Filled 2014-11-29 (×3): qty 1

## 2014-11-29 MED ORDER — ONDANSETRON HCL 4 MG PO TABS: 4.0000 mg | ORAL_TABLET | Freq: Four times a day (QID) | ORAL | Status: DC | PRN

## 2014-11-29 MED ORDER — POTASSIUM CHLORIDE CRYS ER 20 MEQ PO TBCR
20.0000 meq | EXTENDED_RELEASE_TABLET | Freq: Two times a day (BID) | ORAL | Status: DC
  Administered 2014-11-29 – 2014-12-02 (×6): 20 meq via ORAL
  Filled 2014-11-29 (×7): qty 1

## 2014-11-29 MED ORDER — IPRATROPIUM BROMIDE 0.02 % IN SOLN
0.5000 mg | Freq: Four times a day (QID) | RESPIRATORY_TRACT | Status: DC
  Administered 2014-11-30 – 2014-12-02 (×11): 0.5 mg via RESPIRATORY_TRACT
  Filled 2014-11-29 (×11): qty 2.5

## 2014-11-29 MED ORDER — INSULIN ASPART 100 UNIT/ML ~~LOC~~ SOLN
0.0000 [IU] | Freq: Three times a day (TID) | SUBCUTANEOUS | Status: DC
  Administered 2014-11-30: 3 [IU] via SUBCUTANEOUS
  Administered 2014-11-30: 5 [IU] via SUBCUTANEOUS
  Administered 2014-11-30: 3 [IU] via SUBCUTANEOUS
  Administered 2014-12-01 (×2): 2 [IU] via SUBCUTANEOUS
  Administered 2014-12-01: 5 [IU] via SUBCUTANEOUS
  Administered 2014-12-02 (×2): 2 [IU] via SUBCUTANEOUS

## 2014-11-29 MED ORDER — ALBUTEROL (5 MG/ML) CONTINUOUS INHALATION SOLN
10.0000 mg/h | INHALATION_SOLUTION | Freq: Once | RESPIRATORY_TRACT | Status: AC
  Administered 2014-11-29: 10 mg/h via RESPIRATORY_TRACT
  Filled 2014-11-29: qty 20

## 2014-11-29 MED ORDER — CETYLPYRIDINIUM CHLORIDE 0.05 % MT LIQD
7.0000 mL | Freq: Two times a day (BID) | OROMUCOSAL | Status: DC
  Administered 2014-11-29 – 2014-12-02 (×6): 7 mL via OROMUCOSAL

## 2014-11-29 MED ORDER — PRAVASTATIN SODIUM 40 MG PO TABS
80.0000 mg | ORAL_TABLET | Freq: Every day | ORAL | Status: DC
  Administered 2014-11-29 – 2014-12-02 (×4): 80 mg via ORAL
  Filled 2014-11-29 (×4): qty 2

## 2014-11-29 MED ORDER — ENOXAPARIN SODIUM 80 MG/0.8ML ~~LOC~~ SOLN
70.0000 mg | SUBCUTANEOUS | Status: DC
  Administered 2014-11-29 – 2014-12-01 (×3): 70 mg via SUBCUTANEOUS
  Filled 2014-11-29 (×3): qty 0.8

## 2014-11-29 MED ORDER — PREDNISOLONE SODIUM PHOSPHATE 15 MG/5ML PO SOLN
60.0000 mg | Freq: Every day | ORAL | Status: DC
  Administered 2014-11-29: 60 mg via ORAL

## 2014-11-29 MED ORDER — IPRATROPIUM-ALBUTEROL 0.5-2.5 (3) MG/3ML IN SOLN
3.0000 mL | Freq: Once | RESPIRATORY_TRACT | Status: AC
  Administered 2014-11-29: 3 mL via RESPIRATORY_TRACT
  Filled 2014-11-29: qty 3

## 2014-11-29 MED ORDER — IPRATROPIUM BROMIDE 0.02 % IN SOLN
0.5000 mg | Freq: Four times a day (QID) | RESPIRATORY_TRACT | Status: DC
  Administered 2014-11-29: 0.5 mg via RESPIRATORY_TRACT
  Filled 2014-11-29: qty 2.5

## 2014-11-29 MED ORDER — EZETIMIBE 10 MG PO TABS
10.0000 mg | ORAL_TABLET | Freq: Every day | ORAL | Status: DC
  Administered 2014-11-30 – 2014-12-02 (×3): 10 mg via ORAL
  Filled 2014-11-29 (×3): qty 1

## 2014-11-29 MED ORDER — VANCOMYCIN HCL 10 G IV SOLR
1500.0000 mg | INTRAVENOUS | Status: DC
  Filled 2014-11-29: qty 1500

## 2014-11-29 MED ORDER — METHYLPREDNISOLONE SODIUM SUCC 125 MG IJ SOLR
60.0000 mg | Freq: Four times a day (QID) | INTRAMUSCULAR | Status: DC
Start: 2014-11-29 — End: 2014-11-30
  Administered 2014-11-29 – 2014-11-30 (×3): 60 mg via INTRAVENOUS
  Filled 2014-11-29 (×3): qty 2

## 2014-11-29 MED ORDER — FUROSEMIDE 80 MG PO TABS
80.0000 mg | ORAL_TABLET | Freq: Every day | ORAL | Status: DC
  Administered 2014-11-30 – 2014-12-01 (×2): 80 mg via ORAL
  Filled 2014-11-29 (×2): qty 1

## 2014-11-29 MED ORDER — PNEUMOCOCCAL VAC POLYVALENT 25 MCG/0.5ML IJ INJ
0.5000 mL | INJECTION | INTRAMUSCULAR | Status: AC
  Administered 2014-11-30: 0.5 mL via INTRAMUSCULAR
  Filled 2014-11-29: qty 0.5

## 2014-11-29 MED ORDER — LEVALBUTEROL HCL 0.63 MG/3ML IN NEBU
0.6300 mg | INHALATION_SOLUTION | RESPIRATORY_TRACT | Status: DC | PRN
  Administered 2014-12-02: 0.63 mg via RESPIRATORY_TRACT
  Filled 2014-11-29: qty 3

## 2014-11-29 MED ORDER — CARVEDILOL 12.5 MG PO TABS
25.0000 mg | ORAL_TABLET | Freq: Two times a day (BID) | ORAL | Status: DC
  Administered 2014-11-30 – 2014-12-02 (×6): 25 mg via ORAL
  Filled 2014-11-29 (×6): qty 2

## 2014-11-29 MED ORDER — LEVALBUTEROL HCL 0.63 MG/3ML IN NEBU
0.6300 mg | INHALATION_SOLUTION | Freq: Four times a day (QID) | RESPIRATORY_TRACT | Status: DC
  Administered 2014-11-30 – 2014-12-02 (×11): 0.63 mg via RESPIRATORY_TRACT
  Filled 2014-11-29 (×11): qty 3

## 2014-11-29 MED ORDER — SODIUM CHLORIDE 0.9 % IV SOLN: 250.0000 mL | INTRAVENOUS | Status: DC | PRN

## 2014-11-29 MED ORDER — ASPIRIN 81 MG PO CHEW
162.0000 mg | CHEWABLE_TABLET | Freq: Every day | ORAL | Status: DC
  Administered 2014-11-29 – 2014-12-02 (×4): 162 mg via ORAL
  Filled 2014-11-29 (×4): qty 2

## 2014-11-29 MED ORDER — LEVALBUTEROL HCL 0.63 MG/3ML IN NEBU
0.6300 mg | INHALATION_SOLUTION | Freq: Four times a day (QID) | RESPIRATORY_TRACT | Status: DC
  Administered 2014-11-29: 0.63 mg via RESPIRATORY_TRACT
  Filled 2014-11-29: qty 3

## 2014-11-29 MED ORDER — GABAPENTIN 100 MG PO CAPS
100.0000 mg | ORAL_CAPSULE | Freq: Three times a day (TID) | ORAL | Status: DC
  Administered 2014-11-29 – 2014-12-02 (×9): 100 mg via ORAL
  Filled 2014-11-29 (×9): qty 1

## 2014-11-29 MED ORDER — ONDANSETRON HCL 4 MG/2ML IJ SOLN: 4.0000 mg | Freq: Four times a day (QID) | INTRAMUSCULAR | Status: DC | PRN

## 2014-11-29 MED ORDER — SODIUM CHLORIDE 0.9 % IJ SOLN
3.0000 mL | Freq: Two times a day (BID) | INTRAMUSCULAR | Status: DC
  Administered 2014-11-29 – 2014-12-02 (×6): 3 mL via INTRAVENOUS

## 2014-11-29 NOTE — ED Notes (Signed)
12 lead EKG completed and given to Dr. Bebe ShaggyWickline

## 2014-11-29 NOTE — ED Notes (Signed)
Report given to McLoudShaquanda, all questions answered.

## 2014-11-29 NOTE — ED Notes (Signed)
Pt states that she has had worsening SOB and a cough since Monday.

## 2014-11-29 NOTE — ED Provider Notes (Signed)
CSN: [id number]     Arrival date & time 11/29/14  1554 History   First MD Initiated Contact with Patient 11/29/14 1559     Chief Complaint  Patient presents with  . Shortness of Breath  . Cough    Patient is a 69 y.o. female presenting with shortness of breath. The history is provided by the patient and the spouse.  Shortness of Breath Severity:  Moderate Onset quality:  Gradual Duration:  2 days Timing:  Constant Progression:  Worsening Chronicity:  New Relieved by:  Nothing Worsened by:  Nothing tried Associated symptoms: cough   Associated symptoms: no chest pain and no fever   Patient reports cough/SOB for past 2 days No fever/vomiting She reports it is similar to prior episodes of "bronchitis"  Past Medical History  Diagnosis Date  . Brain aneurysm   . Neuropathy   . Diabetes mellitus   . Anemia   . Thyroid disease   . Arthritis   . Macular degeneration   . Chest pain   . Hypertension   . Hyperlipidemia   . Obesity   . Abnormal stress test     -false positive, prob breast attenuation  . Hx of cardiac catheterization 11/2013    normal coronary arteries  . Sleep apnea     doesn't wear CPAP, sleeps in a lift chair  . Difficult intubation 02/08/14    re-intubated in PACU  . COPD (chronic obstructive pulmonary disease)   . CHF (congestive heart failure)   . Brain aneurysm 2004  . Diabetes mellitus without complication   . Renal disorder    Past Surgical History  Procedure Laterality Date  . Tonsillectomy    . Lower back    . Heel spurs    . Hammer toes    . Right knee sark    . Cerebral aneurysm repair    . Cardiac catheterization  12/22/13    done for "angina" and abnormal nuc study- normal coronary arteries  . Cardiac catheterization  2004    insignificant CAD  . Cholecystectomy N/A 02/08/2014    Procedure: LAPAROSCOPIC CHOLECYSTECTOMY;  Surgeon: Dalia Heading, MD;  Location: [facility];  Service: General;  Laterality: N/A;  . Esophagogastroduodenoscopy  2007     Dr. Jena Gauss: mottled patchy erythema of stomach, small bowel bx negative  . Left heart catheterization with coronary angiogram N/A 12/22/2013    Procedure: LEFT HEART CATHETERIZATION WITH CORONARY ANGIOGRAM;  Surgeon: Runell Gess, MD;  Location: [facility] CATH LAB;  Service: Cardiovascular;  Laterality: N/A;  . Transthoracic echocardiogram  10/05/2012    normal EF  . Doppler echocardiography  09/27/2010    EF>55%  . Nm myoview ltd  08/26/2011    EF 51% Low risk scan  . Cardiac catheterization  11/16/2002  . Lower ext arterial dopplers   11/11/2012    Abnormal dopplers  . Lower ext arterial dopplers  04/10/2011    abnormal dopplers  . Carotid duplex  04/10/2011    mildly abnormal carotid duplex  . Abdominal aortogram  05/07/2006  . Sleep study  08/10/2011  . Cpap report  09/03/2011  . Cholecystectomy    . Appendectomy    . Back surgery  1970   Family History  Problem Relation Age of Onset  . Diabetes      family history   . Arthritis      family history   . Diabetes Mother   . Hypertension Mother   . Seizures Sister   .  Colon cancer Neg Hx    History  Substance Use Topics  . Smoking status: Former Smoker    Quit date: 06/02/2002  . Smokeless tobacco: Not on file  . Alcohol Use: No   OB History    Gravida Para Term Preterm AB TAB SAB Ectopic Multiple Living   0 0 0 0 0 0 0 0       Review of Systems  Constitutional: Negative for fever.  Respiratory: Positive for cough and shortness of breath.   Cardiovascular: Negative for chest pain.  All other systems reviewed and are negative.     Allergies  Clarithromycin; Levofloxacin; Metronidazole; Sulfonamide derivatives; Amoxicillin-pot clavulanate; Amoxicillin-pot clavulanate; Augmentin; Clarithromycin; Clindamycin/lincomycin; Clindamycin/lincomycin; Lansoprazole; Levaquin; Metronidazole; and Sulfamethoxazole-trimethoprim  Home Medications   Prior to Admission medications   Medication Sig Start Date End Date Taking? Authorizing  Provider  aspirin 81 MG tablet Take 162 mg by mouth daily.     Historical Provider, MD  BREO ELLIPTA 100-25 MCG/INH AEPB Inhale 1 puff into the lungs daily. 10/02/14   Historical Provider, MD  carvedilol (COREG) 25 MG tablet Take 1 tablet (25 mg total) by mouth 2 (two) times daily with a meal. 02/15/14   Runell GessJonathan J Berry, MD  carvedilol (COREG) 25 MG tablet Take 25 mg by mouth 2 (two) times daily with a meal.    Historical Provider, MD  Coenzyme Q10 (CO Q 10) 100 MG CAPS Take 100 mg by mouth 2 (two) times daily.     Historical Provider, MD  ferrous sulfate 325 (65 FE) MG tablet Take 325 mg by mouth daily.    Historical Provider, MD  folic acid (FOLVITE) 1 MG tablet Take 1 tablet (1 mg total) by mouth daily. 12/20/13   Runell GessJonathan J Berry, MD  folic acid (FOLVITE) 1 MG tablet Take 1 mg by mouth daily.    Historical Provider, MD  furosemide (LASIX) 40 MG tablet Take 80 mg by mouth daily. 07/04/13   Runell GessJonathan J Berry, MD  gabapentin (NEURONTIN) 100 MG capsule Take 100 mg by mouth 3 (three) times daily.    Historical Provider, MD  gabapentin (NEURONTIN) 100 MG capsule Take 100 mg by mouth 2 (two) times daily. 08/22/14   Historical Provider, MD  glipiZIDE (GLUCOTROL) 10 MG tablet Take 2.5 mg by mouth daily as needed (for high blood sugar).    Historical Provider, MD  glipiZIDE (GLUCOTROL) 10 MG tablet Take 2 mg by mouth daily as needed. Only takes if blood sugar is over 120    Historical Provider, MD  HYDROcodone-acetaminophen (HYCET) 7.5-325 mg/15 ml solution Take 15ML PO q 8 hours PRN for pain 11/17/14   Zadie Rhineonald Jadiel Schmieder, MD  IRON PO Take 365 mg by mouth daily.     Historical Provider, MD  levalbuterol Pauline Aus(XOPENEX) 0.63 MG/3ML nebulizer solution Take 3 mLs by nebulization every 6 (six) hours as needed for wheezing or shortness of breath.  10/18/14   Historical Provider, MD  levothyroxine (SYNTHROID, LEVOTHROID) 112 MCG tablet Take 112 mcg by mouth daily before breakfast.    Historical Provider, MD  levothyroxine  (SYNTHROID, LEVOTHROID) 112 MCG tablet Take 112 mcg by mouth daily before breakfast.    Historical Provider, MD  niacin 500 MG tablet Take 500 mg by mouth at bedtime.     Historical Provider, MD  niacin 500 MG tablet Take 500 mg by mouth at bedtime.    Historical Provider, MD  ondansetron (ZOFRAN ODT) 8 MG disintegrating tablet Take 1 tablet (8 mg total) by mouth  every 8 (eight) hours as needed. 8mg  ODT q4 hours prn nausea 11/17/14   Zadie Rhine, MD  potassium chloride SA (K-DUR,KLOR-CON) 20 MEQ tablet Take 20 mEq by mouth 2 (two) times daily. 08/15/14   Historical Provider, MD  pravastatin (PRAVACHOL) 80 MG tablet Take 1 tablet (80 mg total) by mouth daily. 11/25/12   Susa Griffins, MD  pravastatin (PRAVACHOL) 80 MG tablet Take 80 mg by mouth at bedtime. 08/22/14   Historical Provider, MD  ranitidine (ZANTAC) 150 MG tablet Take 150 mg by mouth at bedtime. 09/11/14   Historical Provider, MD  SPIRIVA HANDIHALER 18 MCG inhalation capsule Take 18 mcg by mouth daily. 10/05/14   Historical Provider, MD  traMADol (ULTRAM) 50 MG tablet Take 100 mg by mouth 3 (three) times daily. Takes 2 tablets 4 times daily. 02/23/12   Vickki Hearing, MD  traMADol (ULTRAM) 50 MG tablet Take 100 mg by mouth 2 (two) times daily. 09/08/14   Historical Provider, MD  ZETIA 10 MG tablet TAKE ONE TABLET BY MOUTH ONCE DAILY. 09/14/14   Runell Gess, MD   BP 134/96 mmHg  Pulse 82  Temp(Src) 97.9 F (36.6 C) (Oral)  Resp 22  Ht 5\' 11"  (1.803 m)  Wt 285 lb (129.275 kg)  BMI 39.77 kg/m2  SpO2 95% Physical Exam  CONSTITUTIONAL: Well developed/well nourished HEAD: Normocephalic/atraumatic EYES: EOMI ENMT: Mucous membranes moist NECK: supple no meningeal signs SPINE/BACK:entire spine nontender CV: S1/S2 noted, no murmurs/rubs/gallops noted LUNGS: coarse wheeze/breath sounds bilaterally ABDOMEN: soft, nontender, no rebound or guarding, bowel sounds noted throughout abdomen NEURO: Pt is  awake/alert/appropriate EXTREMITIES: pulses normal/equal SKIN: warm, color normal PSYCH: no abnormalities of mood noted, alert and oriented to situation   ED Course  Procedures   Medications  prednisoLONE (ORAPRED) 15 MG/5ML solution 60 mg (60 mg Oral Given 11/29/14 1800)  albuterol (PROVENTIL,VENTOLIN) solution continuous neb (not administered)  ipratropium-albuterol (DUONEB) 0.5-2.5 (3) MG/3ML nebulizer solution 3 mL (3 mLs Nebulization Given 11/29/14 1656)  albuterol (PROVENTIL) (2.5 MG/3ML) 0.083% nebulizer solution 5 mg (5 mg Nebulization Given 11/29/14 1820)    5:14 PM CXR negative Pt is well appearing She has oxygen at home that she uses PRN and has used today Will give nebs and reassess She is chronically ill, and does not ambulate at baseline 7:13 PM Pt still with wheezing despite neb therapy She still reports dyspnea Will admit for continued workup/treatment of COPD She also requests workup for her chronic weakness that she has since surgery last year (no acute issues today) D/w dr Sharl Ma, will admit for monitoring/observation Labs Review Labs Reviewed  BASIC METABOLIC PANEL - Abnormal; Notable for the following:    Glucose, Bld 129 (*)    Creatinine, Ser 1.38 (*)    GFR calc non Af Amer 38 (*)    GFR calc Af Amer 44 (*)    All other components within normal limits  CBC WITH DIFFERENTIAL/PLATELET - Abnormal; Notable for the following:    WBC 11.2 (*)    Hemoglobin 11.3 (*)    Neutro Abs 8.0 (*)    All other components within normal limits    Imaging Review Dg Chest Portable 1 View  11/29/2014   CLINICAL DATA:  Shortness of breath with nonproductive cough and congestion for 3 days. History of congestive heart failure, hypertension, COPD and diabetes. Initial encounter.  EXAM: PORTABLE CHEST - 1 VIEW  COMPARISON:  08/22/2014 and 02/09/2014.  FINDINGS: 1621 hours. There are low lung volumes with asymmetric elevation the  left hemidiaphragm and mild bibasilar atelectasis,  similar to the prior studies. No edema, confluent airspace opacity or significant pleural effusion identified. The heart size and mediastinal contours are stable. There is mild atherosclerosis of the aortic arch. Mild thoracic spine degenerative changes are noted. Telemetry leads overlie the chest.  IMPRESSION: Stable chest with chronic mild bibasilar atelectasis. No acute findings.   Electronically Signed   By: Carey Bullocks M.D.   On: 11/29/2014 16:32     EKG Interpretation   Date/Time:  Wednesday November 29 2014 15:59:42 EDT Ventricular Rate:  86 PR Interval:    QRS Duration: 110 QT Interval:  396 QTC Calculation: 474 R Axis:   9 Text Interpretation:  rhythm indeterminate significant artifact Multiple  ventricular premature complexes Incomplete left bundle branch block  Borderline low voltage, extremity leads Baseline wander in lead(s) I II  III aVL aVF V2 V6 Confirmed by Bebe Shaggy  MD, Stevie Ertle ([id number]) on 11/29/2014  4:06:41 PM      EKG Interpretation  Date/Time:  Wednesday November 29 2014 16:03:37 EDT Ventricular Rate:  76 PR Interval:  162 QRS Duration: 109 QT Interval:  457 QTC Calculation: 514 R Axis:   -9 Text Interpretation:  Sinus rhythm Incomplete left bundle branch block Low voltage, extremity leads Prolonged QT interval artifact noted Confirmed by Bebe Shaggy  MD, Dorinda Hill ([id number]) on 11/29/2014 4:07:49 PM        MDM   Final diagnoses:  COPD exacerbation    Nursing notes including past medical history and social history reviewed and considered in documentation xrays/imaging reviewed by myself and considered during evaluation Labs/vital reviewed myself and considered during evaluation Previous records reviewed and considered     Zadie Rhine, MD 11/29/14 1914

## 2014-11-29 NOTE — H&P (Addendum)
PCP:   Colette Ribas, MD   Chief Complaint:  Shortness of breath  HPI: 69 year old female who   has a past medical history of Brain aneurysm; Neuropathy; Diabetes mellitus; Anemia; Thyroid disease; Arthritis; Macular degeneration; Chest pain; Hypertension; Hyperlipidemia; Obesity; Abnormal stress test; cardiac catheterization (11/2013); Sleep apnea; Difficult intubation (02/08/14); COPD (chronic obstructive pulmonary disease); CHF (congestive heart failure); Brain aneurysm (2004); Diabetes mellitus without complication; and Renal disorder. *Today patient comes to the hospital with chief: No shortness of breath going on for past few days. She also complains of coughing up white colored phlegm. Also complains of fever and chills. Denies chest pain, patient uses oxygen on a when necessary basis at home. Denies dysuria, no nausea vomiting or diarrhea. No abdominal pain. Patient says that she has difficulty speaking and swallowing since she had cholecystectomy in September last year. She also complains of weakness in the right lower extremity which has been going on for many months, she is unable to be awakened on the right side. MRI lumbar spine done in 2013 showed patient had right-sided laminotomy L5-S1 disc degeneration and spondylosis without recurrent disc protrusion.  In the ED patient was started on DuoNeb nebulizers, breathing better.  Allergies:   Allergies  Allergen Reactions  . Clarithromycin Anaphylaxis  . Levofloxacin Anaphylaxis  . Metronidazole Anaphylaxis  . Sulfonamide Derivatives Anaphylaxis  . Amoxicillin-Pot Clavulanate     Heart palpitations  . Amoxicillin-Pot Clavulanate   . Augmentin [Amoxicillin-Pot Clavulanate] Other (See Comments)    Heart palpatation  . Clarithromycin   . Clindamycin/Lincomycin Diarrhea and Nausea And Vomiting  . Clindamycin/Lincomycin Diarrhea  . Lansoprazole     SOB, heart racing when took Prevpac  . Levaquin [Levofloxacin In D5w]   .  Metronidazole   . Sulfamethoxazole-Trimethoprim Swelling      Past Medical History  Diagnosis Date  . Brain aneurysm   . Neuropathy   . Diabetes mellitus   . Anemia   . Thyroid disease   . Arthritis   . Macular degeneration   . Chest pain   . Hypertension   . Hyperlipidemia   . Obesity   . Abnormal stress test     -false positive, prob breast attenuation  . Hx of cardiac catheterization 11/2013    normal coronary arteries  . Sleep apnea     doesn't wear CPAP, sleeps in a lift chair  . Difficult intubation 02/08/14    re-intubated in PACU  . COPD (chronic obstructive pulmonary disease)   . CHF (congestive heart failure)   . Brain aneurysm 2004  . Diabetes mellitus without complication   . Renal disorder     Past Surgical History  Procedure Laterality Date  . Tonsillectomy    . Lower back    . Heel spurs    . Hammer toes    . Right knee sark    . Cerebral aneurysm repair    . Cardiac catheterization  12/22/13    done for "angina" and abnormal nuc study- normal coronary arteries  . Cardiac catheterization  2004    insignificant CAD  . Cholecystectomy N/A 02/08/2014    Procedure: LAPAROSCOPIC CHOLECYSTECTOMY;  Surgeon: Dalia Heading, MD;  Location: [facility];  Service: General;  Laterality: N/A;  . Esophagogastroduodenoscopy  2007    Dr. Jena Gauss: mottled patchy erythema of stomach, small bowel bx negative  . Left heart catheterization with coronary angiogram N/A 12/22/2013    Procedure: LEFT HEART CATHETERIZATION WITH CORONARY ANGIOGRAM;  Surgeon: Runell Gess,  MD;  Location: [facility] CATH LAB;  Service: Cardiovascular;  Laterality: N/A;  . Transthoracic echocardiogram  10/05/2012    normal EF  . Doppler echocardiography  09/27/2010    EF>55%  . Nm myoview ltd  08/26/2011    EF 51% Low risk scan  . Cardiac catheterization  11/16/2002  . Lower ext arterial dopplers   11/11/2012    Abnormal dopplers  . Lower ext arterial dopplers  04/10/2011    abnormal dopplers  . Carotid duplex   04/10/2011    mildly abnormal carotid duplex  . Abdominal aortogram  05/07/2006  . Sleep study  08/10/2011  . Cpap report  09/03/2011  . Cholecystectomy    . Appendectomy    . Back surgery  1970    Prior to Admission medications   Medication Sig Start Date End Date Taking? Authorizing Provider  aspirin 81 MG tablet Take 162 mg by mouth daily.    Yes Historical Provider, MD  BREO ELLIPTA 100-25 MCG/INH AEPB Inhale 1 puff into the lungs daily. 10/02/14  Yes Historical Provider, MD  carvedilol (COREG) 25 MG tablet Take 1 tablet (25 mg total) by mouth 2 (two) times daily with a meal. 02/15/14  Yes Runell Gess, MD  Coenzyme Q10 (CO Q 10) 100 MG CAPS Take 100 mg by mouth 2 (two) times daily.    Yes Historical Provider, MD  ferrous sulfate 325 (65 FE) MG tablet Take 325 mg by mouth daily.   Yes Historical Provider, MD  folic acid (FOLVITE) 1 MG tablet Take 1 tablet (1 mg total) by mouth daily. 12/20/13  Yes Runell Gess, MD  furosemide (LASIX) 40 MG tablet Take 80 mg by mouth daily. 07/04/13  Yes Runell Gess, MD  gabapentin (NEURONTIN) 100 MG capsule Take 100 mg by mouth 3 (three) times daily.   Yes Historical Provider, MD  glipiZIDE (GLUCOTROL) 10 MG tablet Take 2 mg by mouth daily as needed. Only takes if blood sugar is over 120   Yes Historical Provider, MD  levalbuterol (XOPENEX) 0.63 MG/3ML nebulizer solution Take 3 mLs by nebulization every 6 (six) hours as needed for wheezing or shortness of breath.  10/18/14  Yes Historical Provider, MD  levothyroxine (SYNTHROID, LEVOTHROID) 112 MCG tablet Take 112 mcg by mouth daily before breakfast.   Yes Historical Provider, MD  niacin 500 MG tablet Take 500 mg by mouth at bedtime.    Yes Historical Provider, MD  ondansetron (ZOFRAN ODT) 8 MG disintegrating tablet Take 1 tablet (8 mg total) by mouth every 8 (eight) hours as needed.  ODT q4 hours prn nausea 11/17/14  Yes Zadie Rhine, MD  potassium chloride SA (K-DUR,KLOR-CON) 20 MEQ tablet Take 20  mEq by mouth 2 (two) times daily. 08/15/14  Yes Historical Provider, MD  pravastatin (PRAVACHOL) 80 MG tablet Take 1 tablet (80 mg total) by mouth daily. 11/25/12  Yes Susa Griffins, MD  ranitidine (ZANTAC) 150 MG tablet Take 150 mg by mouth at bedtime. 09/11/14  Yes Historical Provider, MD  SPIRIVA HANDIHALER 18 MCG inhalation capsule Take 18 mcg by mouth daily. 10/05/14  Yes Historical Provider, MD  traMADol (ULTRAM) 50 MG tablet Take 100 mg by mouth 4 (four) times daily as needed for moderate pain. Takes 2 tablets 4 times daily. 02/23/12  Yes Vickki Hearing, MD  ZETIA 10 MG tablet TAKE ONE TABLET BY MOUTH ONCE DAILY. 09/14/14  Yes Runell Gess, MD  HYDROcodone-acetaminophen (HYCET) 7.5-325 mg/15 ml solution Take PO q 8  hours PRN for pain Patient not taking: Reported on 11/29/2014 11/17/14   Zadie Rhineonald Wickline, MD    Social History:  reports that she quit smoking about 12 years ago. She does not have any smokeless tobacco history on file. She reports that she does not drink alcohol or use illicit drugs.  Family History  Problem Relation Age of Onset  . Diabetes      family history   . Arthritis      family history   . Diabetes Mother   . Hypertension Mother   . Seizures Sister   . Colon cancer Neg Hx     Filed Weights   11/29/14 1555  Weight: 129.275 kg (285 lb)    All the positives are listed in BOLD  Review of Systems:  HEENT: Headache, blurred vision, runny nose, sore throat Neck: Hypothyroidism, hyperthyroidism,,lymphadenopathy Chest : Shortness of breath, history of COPD, Asthma Heart : Chest pain, history of coronary arterey disease GI:  Nausea, vomiting, diarrhea, constipation, GERD GU: Dysuria, urgency, frequency of urination, hematuria Neuro: Stroke, seizures, syncope, short-term memory loss since surgery for brain aneurysm Psych: Depression, anxiety, hallucinations   Physical Exam: Blood pressure 117/50, pulse 81, temperature 97.9 F (36.6 C), temperature  source Oral, resp. rate 16, height 5\' 11"  (1.803 m), weight 129.275 kg (285 lb), SpO2 100 %. Constitutional:   Patient is a well-developed and well-nourished female* in no acute distress and cooperative with exam. Head: Normocephalic and atraumatic Mouth: Mucus membranes moist Eyes: PERRL, EOMI, conjunctivae normal Neck: Supple, No Thyromegaly Cardiovascular: RRR, S1 normal, S2 normal Pulmonary/Chest: Bilateral rhonchi Abdominal: Soft. Non-tender, non-distended, bowel sounds are normal, no masses, organomegaly, or guarding present.  Neurological: A&O x3, Strength is normal and symmetric bilaterally, cranial nerve II-XII are grossly intact, no focal motor deficit, sensory intact to light touch bilaterally.  Extremities : No Cyanosis, Clubbing, right lower extremity edematous, warm to touch. Loss of sensation in the right foot.  Labs on Admission:  Basic Metabolic Panel:  Recent Labs Lab 11/29/14 1645  NA 142  K 3.5  CL 102  CO2 30  GLUCOSE 129*  BUN 20  CREATININE 1.38*  CALCIUM 8.9   CBC:  Recent Labs Lab 11/29/14 1645  WBC 11.2*  NEUTROABS 8.0*  HGB 11.3*  HCT 37.3  MCV 88.0  PLT 278   Cardiac Enzymes: No results for input(s): CKTOTAL, CKMB, CKMBINDEX, TROPONINI in the last 168 hours.  BNP (last 3 results)  Recent Labs  10/18/14 1829  BNP 53.0    ProBNP (last 3 results) No results for input(s): PROBNP in the last 8760 hours.  CBG: No results for input(s): GLUCAP in the last 168 hours.  Radiological Exams on Admission: Dg Chest Portable 1 View  11/29/2014   CLINICAL DATA:  Shortness of breath with nonproductive cough and congestion for 3 days. History of congestive heart failure, hypertension, COPD and diabetes. Initial encounter.  EXAM: PORTABLE CHEST - 1 VIEW  COMPARISON:  08/22/2014 and 02/09/2014.  FINDINGS: 1621 hours. There are low lung volumes with asymmetric elevation the left hemidiaphragm and mild bibasilar atelectasis, similar to the prior studies.  No edema, confluent airspace opacity or significant pleural effusion identified. The heart size and mediastinal contours are stable. There is mild atherosclerosis of the aortic arch. Mild thoracic spine degenerative changes are noted. Telemetry leads overlie the chest.  IMPRESSION: Stable chest with chronic mild bibasilar atelectasis. No acute findings.   Electronically Signed   By: Carey BullocksWilliam  Veazey M.D.   On: 11/29/2014  16:32    EKG: Independently reviewed. Normal sinus rhythm, prolonged QT interval 514 ms   Assessment/Plan Active Problems:   DM type 2 (diabetes mellitus, type 2)   COPD exacerbation    Cellulitis    Right leg swelling  COPD exacerbation  Patient will be admitted with COPD exacerbation, will start salmeterol 16 g IV every 6 hours, Xopenex neb l every 6 hours, ipratropium neb every 6 hours.   Right leg cellulitis Patient has erythema and warmth in the right lower extremity, will check venous Doppler to rule out DVT. Also start vancomycin for possible right leg cellulitis  Diabetes mellitus We'll start sliding scale insulin with NovoLog.  Right leg weakness, chronic Patient has chronic right leg weakness, she does have degenerative disc disease, affecting L5-S1. Will repeat MRI lumbar spine in a.m.  Mild prolonged QT Patient has mild prolonged QT interval 514 on EKG. Will check serum magnesium level.  DVT Prophlaxis Lovenox   Code  status: full code  Family discussion:  plan of care including tests being ordered have been discussed with the patient and *her husband at bedside who indicate understanding and agree with the plan and Code Status.   Time Spent on Admission: 60 minutes  Joyleen Haselton S [facility] Pager: [phone] 11/29/2014, 7:51 PM  If 7PM-7AM, please contact night-coverage  [email or web address]  Password [email or web address]

## 2014-11-29 NOTE — Progress Notes (Signed)
ANTIBIOTIC CONSULT NOTE  Pharmacy Consult for Vancomycin Indication: cellulitis   Allergies  Allergen Reactions  . Clarithromycin Anaphylaxis  . Levofloxacin Anaphylaxis  . Metronidazole Anaphylaxis  . Sulfonamide Derivatives Anaphylaxis  . Amoxicillin-Pot Clavulanate     Heart palpitations  . Amoxicillin-Pot Clavulanate   . Augmentin [Amoxicillin-Pot Clavulanate] Other (See Comments)    Heart palpatation  . Clarithromycin   . Clindamycin/Lincomycin Diarrhea and Nausea And Vomiting  . Clindamycin/Lincomycin Diarrhea  . Lansoprazole     SOB, heart racing when took Prevpac  . Levaquin [Levofloxacin In D5w]   . Metronidazole   . Sulfamethoxazole-Trimethoprim Swelling    Patient Measurements: Height: 5\' 11"  (180.3 cm) Weight: 285 lb (129.275 kg) IBW/kg (Calculated) : 70.8 Vital Signs: Temp: 97.6 F (36.4 C) (06/29 2040) Temp Source: Oral (06/29 2040) BP: 127/85 mmHg (06/29 2040) Pulse Rate: 84 (06/29 2040) Intake/Output from previous day:   Intake/Output from this shift:    Labs:  Recent Labs  11/29/14 1645  WBC 11.2*  HGB 11.3*  PLT 278  CREATININE 1.38*   Estimated Creatinine Clearance: 58 mL/min (by C-G formula based on Cr of 1.38). No results for input(s): VANCOTROUGH, VANCOPEAK, VANCORANDOM, GENTTROUGH, GENTPEAK, GENTRANDOM, TOBRATROUGH, TOBRAPEAK, TOBRARND, AMIKACINPEAK, AMIKACINTROU, AMIKACIN in the last 72 hours.   Microbiology: Recent Results (from the past 720 hour(s))  Urine culture     Status: None   Collection Time: 11/17/14  6:35 AM  Result Value Ref Range Status   Specimen Description URINE, CATHETERIZED  Final   Special Requests NONE  Final   Culture   Final    NO GROWTH 2 DAYS Performed at [facility]     Report Status 11/19/2014 FINAL  Final    Anti-infectives    None     Assessment: Okay for Protocol, Obesity/Normalized CrCl Vancomycin dosing protocol will be initiated with an estimated normalized CrCl = 43.8 ml/min.  Multiple ABX allergies noted.  Goal of Therapy:  Vancomycin trough level 10-15 mcg/ml  Plan:  Vancomycin 2gm IV x 1 tonight, then 1500mg  IV every 24 hours. Measure antibiotic drug levels at steady state Follow up culture results  Mady GemmaHayes, Delberta Folts R 11/29/2014,8:56 PM

## 2014-11-30 ENCOUNTER — Observation Stay (HOSPITAL_COMMUNITY): Payer: Commercial Managed Care - HMO

## 2014-11-30 ENCOUNTER — Inpatient Hospital Stay (HOSPITAL_COMMUNITY): Payer: Commercial Managed Care - HMO

## 2014-11-30 DIAGNOSIS — E43 Unspecified severe protein-calorie malnutrition: Secondary | ICD-10-CM | POA: Diagnosis present

## 2014-11-30 DIAGNOSIS — Z6833 Body mass index (BMI) 33.0-33.9, adult: Secondary | ICD-10-CM | POA: Diagnosis not present

## 2014-11-30 DIAGNOSIS — N390 Urinary tract infection, site not specified: Secondary | ICD-10-CM | POA: Diagnosis present

## 2014-11-30 DIAGNOSIS — R0602 Shortness of breath: Secondary | ICD-10-CM | POA: Diagnosis present

## 2014-11-30 DIAGNOSIS — H3530 Unspecified macular degeneration: Secondary | ICD-10-CM | POA: Diagnosis present

## 2014-11-30 DIAGNOSIS — Z7982 Long term (current) use of aspirin: Secondary | ICD-10-CM | POA: Diagnosis not present

## 2014-11-30 DIAGNOSIS — L03115 Cellulitis of right lower limb: Secondary | ICD-10-CM | POA: Insufficient documentation

## 2014-11-30 DIAGNOSIS — E118 Type 2 diabetes mellitus with unspecified complications: Secondary | ICD-10-CM | POA: Diagnosis not present

## 2014-11-30 DIAGNOSIS — I4581 Long QT syndrome: Secondary | ICD-10-CM | POA: Diagnosis present

## 2014-11-30 DIAGNOSIS — N289 Disorder of kidney and ureter, unspecified: Secondary | ICD-10-CM

## 2014-11-30 DIAGNOSIS — I509 Heart failure, unspecified: Secondary | ICD-10-CM | POA: Diagnosis present

## 2014-11-30 DIAGNOSIS — I129 Hypertensive chronic kidney disease with stage 1 through stage 4 chronic kidney disease, or unspecified chronic kidney disease: Secondary | ICD-10-CM | POA: Diagnosis present

## 2014-11-30 DIAGNOSIS — Z9981 Dependence on supplemental oxygen: Secondary | ICD-10-CM | POA: Diagnosis not present

## 2014-11-30 DIAGNOSIS — Z8249 Family history of ischemic heart disease and other diseases of the circulatory system: Secondary | ICD-10-CM | POA: Diagnosis not present

## 2014-11-30 DIAGNOSIS — Z7401 Bed confinement status: Secondary | ICD-10-CM | POA: Diagnosis not present

## 2014-11-30 DIAGNOSIS — Z87891 Personal history of nicotine dependence: Secondary | ICD-10-CM | POA: Diagnosis not present

## 2014-11-30 DIAGNOSIS — Z833 Family history of diabetes mellitus: Secondary | ICD-10-CM | POA: Diagnosis not present

## 2014-11-30 DIAGNOSIS — M5136 Other intervertebral disc degeneration, lumbar region: Secondary | ICD-10-CM | POA: Diagnosis present

## 2014-11-30 DIAGNOSIS — T17998D Other foreign object in respiratory tract, part unspecified causing other injury, subsequent encounter: Secondary | ICD-10-CM | POA: Diagnosis not present

## 2014-11-30 DIAGNOSIS — R29898 Other symptoms and signs involving the musculoskeletal system: Secondary | ICD-10-CM | POA: Insufficient documentation

## 2014-11-30 DIAGNOSIS — N182 Chronic kidney disease, stage 2 (mild): Secondary | ICD-10-CM | POA: Diagnosis present

## 2014-11-30 DIAGNOSIS — E1140 Type 2 diabetes mellitus with diabetic neuropathy, unspecified: Secondary | ICD-10-CM | POA: Diagnosis present

## 2014-11-30 DIAGNOSIS — J9611 Chronic respiratory failure with hypoxia: Secondary | ICD-10-CM | POA: Diagnosis present

## 2014-11-30 DIAGNOSIS — J441 Chronic obstructive pulmonary disease with (acute) exacerbation: Secondary | ICD-10-CM | POA: Diagnosis present

## 2014-11-30 DIAGNOSIS — I878 Other specified disorders of veins: Secondary | ICD-10-CM | POA: Diagnosis present

## 2014-11-30 DIAGNOSIS — I10 Essential (primary) hypertension: Secondary | ICD-10-CM | POA: Diagnosis not present

## 2014-11-30 LAB — COMPREHENSIVE METABOLIC PANEL
ALT: 32 U/L (ref 14–54)
AST: 30 U/L (ref 15–41)
Albumin: 3.3 g/dL — ABNORMAL LOW (ref 3.5–5.0)
Alkaline Phosphatase: 90 U/L (ref 38–126)
Anion gap: 8 (ref 5–15)
BUN: 21 mg/dL — AB (ref 6–20)
CALCIUM: 8.9 mg/dL (ref 8.9–10.3)
CO2: 28 mmol/L (ref 22–32)
Chloride: 104 mmol/L (ref 101–111)
Creatinine, Ser: 1.26 mg/dL — ABNORMAL HIGH (ref 0.44–1.00)
GFR calc Af Amer: 50 mL/min — ABNORMAL LOW (ref >60)
GFR calc non Af Amer: 43 mL/min — ABNORMAL LOW (ref >60)
Glucose, Bld: 184 mg/dL — ABNORMAL HIGH (ref 65–99)
Potassium: 4.1 mmol/L (ref 3.5–5.1)
Sodium: 140 mmol/L (ref 135–145)
Total Bilirubin: 0.8 mg/dL (ref 0.3–1.2)
Total Protein: 7.5 g/dL (ref 6.5–8.1)

## 2014-11-30 LAB — CBC
HCT: 38.2 % (ref 36.0–46.0)
Hemoglobin: 11.6 g/dL — ABNORMAL LOW (ref 12.0–15.0)
MCH: 26.5 pg (ref 26.0–34.0)
MCHC: 30.4 g/dL (ref 30.0–36.0)
MCV: 87.4 fL (ref 78.0–100.0)
Platelets: 253 10*3/uL (ref 150–400)
RBC: 4.37 MIL/uL (ref 3.87–5.11)
RDW: 15.1 % (ref 11.5–15.5)
WBC: 8.1 10*3/uL (ref 4.0–10.5)

## 2014-11-30 LAB — GLUCOSE, CAPILLARY
GLUCOSE-CAPILLARY: 176 mg/dL — AB (ref 65–99)
GLUCOSE-CAPILLARY: 245 mg/dL — AB (ref 65–99)
GLUCOSE-CAPILLARY: 194 mg/dL — AB (ref 65–99)
Glucose-Capillary: 143 mg/dL — ABNORMAL HIGH (ref 65–99)
Glucose-Capillary: 280 mg/dL — ABNORMAL HIGH (ref 65–99)

## 2014-11-30 MED ORDER — HYDROCODONE-ACETAMINOPHEN 7.5-325 MG/15ML PO SOLN
15.0000 mL | Freq: Three times a day (TID) | ORAL | Status: DC | PRN
  Administered 2014-11-30 – 2014-12-02 (×5): 15 mL via ORAL
  Filled 2014-11-30 (×5): qty 15

## 2014-11-30 MED ORDER — PRO-STAT 64 PO LIQD
30.0000 mL | Freq: Two times a day (BID) | ORAL | Status: DC
  Administered 2014-11-30 – 2014-12-02 (×5): 30 mL via ORAL
  Filled 2014-11-30 (×6): qty 30

## 2014-11-30 MED ORDER — RESOURCE THICKENUP CLEAR PO POWD
ORAL | Status: DC | PRN
  Filled 2014-11-30 (×2): qty 125

## 2014-11-30 MED ORDER — METHYLPREDNISOLONE SODIUM SUCC 125 MG IJ SOLR
60.0000 mg | Freq: Two times a day (BID) | INTRAMUSCULAR | Status: DC
  Administered 2014-11-30 – 2014-12-01 (×3): 60 mg via INTRAVENOUS
  Filled 2014-11-30 (×4): qty 2

## 2014-11-30 MED ORDER — VANCOMYCIN HCL 10 G IV SOLR
1250.0000 mg | INTRAVENOUS | Status: DC
  Administered 2014-11-30: 1250 mg via INTRAVENOUS
  Filled 2014-11-30 (×4): qty 1250

## 2014-11-30 MED ORDER — MAGNESIUM OXIDE 400 (241.3 MG) MG PO TABS
400.0000 mg | ORAL_TABLET | Freq: Every day | ORAL | Status: AC
  Administered 2014-11-30 – 2014-12-02 (×3): 400 mg via ORAL
  Filled 2014-11-30 (×3): qty 1

## 2014-11-30 NOTE — Progress Notes (Signed)
Initial Nutrition Assessment  DOCUMENTATION CODES: Severe malnutrition in context of chronic illness  INTERVENTION: Recommend swallow eval to determine appropriate diet  Prostat BID, each supplement provides 100 kcal, 15 g Pro  Recommend MVI with minerals d/t prolonged suboptimal intake  If SLT approves, will switch to Ensure BID  NUTRITION DIAGNOSIS: Inadequate oral intake related to reported trouble swallowing and chronic illness as evidenced by loss of >20% bw in 1 year  GOAL: Patient will meet greater than or equal to 90% of their needs  MONITOR: PO intake, Supplement acceptance, Diet advancement, Labs, I & O's, SLT Recs  REASON FOR ASSESSMENT: Malnutrition Screening Tool    ASSESSMENT: 69 year old female PMHx  Brain aneurysm, Neuropathy, DM, Anemia; HTN, HLD Obesity;  Sleep apnea; Difficult intubation (02/08/14); COPD, and CHF presents to hospital with shortness of breath going on for past few days. She also complains of coughing up white colored phlegm. Also complains of fever and chills  Note: Pt with short term memory loss, unable to determine accuracy of her report  Pt was very distraught, teary, and frustrated when I met with her. It appears she is extremely upset about her state of health and the fact that her husband has to take care of her. She appears to have lost >65 lbs in 1 year alone. I asked if she had just not been eating, she states she has been eating, but just "correctly". She said she has been eating less in an attempt to lose weight so it will be easier for her husband to take care of her.   She states she followed a low sodium/DM diet. She reports taking a mvi and occasional protein supplement.   Furthermore, roughly one year ago she has a cholecystectomy. She reports being intubated after the procedure. Upon removal of the tube she says she then had trouble swallowing/speaking. She says she can eat regular meals, but they have to be small bites and has to  eat slowly. She reports struggling with liquids. She reports no coughing during her meals, but isnt sure she is not aspirating.   Denies c/d/v. Has chronic nausea which she takes medication for.   If what she reported is accurate My conclusion is that her weight loss is multifactorial. Foremost, my guess is her trouble swallowing is her biggest obstacle. She also seems to be self restricting and her chronic disease states also likely play a factor.  Physical assessment reveals moderate temporal wasting and orbital/tricep fat loss. She has significant edema in her legs. She is non-ambulatory and has neuropathy in her right leg.    Height: Ht Readings from Last 1 Encounters:  11/29/14 5' 11"  (1.803 m)    Weight: Wt Readings from Last 1 Encounters:  11/30/14 242 lb 9.6 oz (110.043 kg)    Ideal Body Weight:  70.45 kg  Wt Readings from Last 10 Encounters:  11/30/14 242 lb 9.6 oz (110.043 kg)  11/17/14 260 lb (117.935 kg)  10/18/14 260 lb (117.935 kg)  09/15/14 264 lb (119.75 kg)  07/24/14 274 lb (124.286 kg)  03/18/14 289 lb 3.9 oz (131.2 kg)  02/08/14 292 lb (132.45 kg)  02/03/14 292 lb (132.45 kg)  01/04/14 296 lb 1.6 oz (134.31 kg)  12/22/13 297 lb (134.718 kg)  12/06/13: 307 lbs  BMI:  Body mass index is 33.85 kg/(m^2).  Estimated Nutritional Needs: Kcal:  1650-1750 kcals (15-16 kcal/kg) Protein:  77-91 g (1.1-1.3g/kg) Fluid:  Per MD  Skin:  Reviewed, no issues  Diet Order:  Diet heart healthy/carb modified Room service appropriate?: Yes; Fluid consistency:: Honey Thick  EDUCATION NEEDS: No education needs identified at this time   Intake/Output Summary (Last 24 hours) at 11/30/14 1547 Last data filed at 11/30/14 1300  Gross per 24 hour  Intake    440 ml  Output      0 ml  Net    440 ml    Last BM:  6/29  Burtis Junes RD, LDN Nutrition Pager: [phone] 11/30/2014 3:47 PM

## 2014-11-30 NOTE — Evaluation (Signed)
Clinical/Bedside Swallow Evaluation Patient Details  Name: Samantha [name] MRN: [id number] Date of Birth: 10-13-1945  Today's Date: 11/30/2014 Time: SLP Start Time (ACUTE ONLY): 1730 SLP Stop Time (ACUTE ONLY): 1806 SLP Time Calculation (min) (ACUTE ONLY): 36 min  Past Medical History:  Past Medical History  Diagnosis Date  . Brain aneurysm   . Neuropathy   . Diabetes mellitus   . Anemia   . Thyroid disease   . Arthritis   . Macular degeneration   . Chest pain   . Hypertension   . Hyperlipidemia   . Obesity   . Abnormal stress test     -false positive, prob breast attenuation  . Hx of cardiac catheterization 11/2013    normal coronary arteries  . Sleep apnea     doesn't wear CPAP, sleeps in a lift chair  . Difficult intubation 02/08/14    re-intubated in PACU  . COPD (chronic obstructive pulmonary disease)   . CHF (congestive heart failure)   . Brain aneurysm 2004  . Diabetes mellitus without complication   . Renal disorder    Past Surgical History:  Past Surgical History  Procedure Laterality Date  . Tonsillectomy    . Lower back    . Heel spurs    . Hammer toes    . Right knee sark    . Cerebral aneurysm repair    . Cardiac catheterization  12/22/13    done for "angina" and abnormal nuc study- normal coronary arteries  . Cardiac catheterization  2004    insignificant CAD  . Cholecystectomy N/A 02/08/2014    Procedure: LAPAROSCOPIC CHOLECYSTECTOMY;  Surgeon: Dalia Heading, MD;  Location: [facility];  Service: General;  Laterality: N/A;  . Esophagogastroduodenoscopy  2007    Dr. Jena Gauss: mottled patchy erythema of stomach, small bowel bx negative  . Left heart catheterization with coronary angiogram N/A 12/22/2013    Procedure: LEFT HEART CATHETERIZATION WITH CORONARY ANGIOGRAM;  Surgeon: Runell Gess, MD;  Location: [facility] CATH LAB;  Service: Cardiovascular;  Laterality: N/A;  . Transthoracic echocardiogram  10/05/2012    normal EF  . Doppler echocardiography  09/27/2010     EF>55%  . Nm myoview ltd  08/26/2011    EF 51% Low risk scan  . Cardiac catheterization  11/16/2002  . Lower ext arterial dopplers   11/11/2012    Abnormal dopplers  . Lower ext arterial dopplers  04/10/2011    abnormal dopplers  . Carotid duplex  04/10/2011    mildly abnormal carotid duplex  . Abdominal aortogram  05/07/2006  . Sleep study  08/10/2011  . Cpap report  09/03/2011  . Cholecystectomy    . Appendectomy    . Back surgery  1970   HPI:  Samantha [name] is a 69 yo female who was admitted with SOB, coughing, fever, and chills. She has a past medical history of Brain aneurysm; Neuropathy; Diabetes mellitus; Anemia; Thyroid disease; Arthritis; Macular degeneration; Chest pain; Hypertension; Hyperlipidemia; Obesity; Abnormal stress test; cardiac catheterization (11/2013); Sleep apnea; Difficult intubation (02/08/14); COPD (chronic obstructive pulmonary disease); CHF (congestive heart failure); Brain aneurysm (2004); Diabetes mellitus without complication; and Renal disorder.   Patient says that she has difficulty speaking and swallowing since she had cholecystectomy in September last year. She initially did okay following cholecystectomy but then she developed hypoventilation and had reintubated.She also complains of weakness in the right lower extremity which has been going on for many months, she is unable to be awakened on the right  side. MRI lumbar spine done in 2013 showed patient had right-sided laminotomy L5-S1 disc degeneration and spondylosis without recurrent disc protrusion. SLP asked to evaluate swallow due to history of dysphagia with need for diet modification at [facility] March 2016. [facility] sent records, but it did not include MBSS that was apparently completed there.   Assessment / Plan / Recommendation Clinical Impression  Pt shows positive signs of aspiration with thin liquids c/b delayed and immediate (with use of chin tuck ironically as she tells me this is what she was told  do). Pt is short of breath and may be having difficulty coordinating respiration with po swallowing. Additionally, pt's vocal quality is breathy and she demonstrates poor vocal fold adduction in cued and spontaneous cough. She reports that she has been working on vocal fold adduction exercises with HH SLP, Burgess Estelle.   Given her complex recent medical history, recommend MBSS to help determine appropriate diet and strategies for safe and efficient consumption. She reports that she had MBSS at [facility] in March 2016, however I was unable to locate this in the records that were sent over earlier today. I have re-sent the request with specifications for MBSS report. Regardless, a repeat MBSS is indicated given her current situation. Recommend D3/mech soft diet with nectar-thick liquids with aspiration precautions. Risk for aspiration is judged to be moderate at this time given ineffective cough and medical co-morbidities. Pt and husband tell me that she was going to church and singing in the choir prior to her surgery in September. Pt will need a lift for transfer into hausted chair for MBSS. MBSS will be completed Friday early afternoon.    Aspiration Risk  Moderate    Diet Recommendation Dysphagia 3 (Mech soft);Nectar   Medication Administration: Crushed with puree (or whole with puree) Compensations: Small sips/bites;Multiple dry swallows after each bite/sip;Clear throat intermittently    Other  Recommendations Oral Care Recommendations: Oral care BID Other Recommendations: Order thickener from pharmacy;Clarify dietary restrictions   Follow Up Recommendations       Frequency and Duration min 2x/week  1 week   Pertinent Vitals/Pain O2 via nasal cannula    SLP Swallow Goals   Pt will demonstrate safe and efficient consumption of least restrictive diet with use of strategies as needed. MBSS pending   Swallow Study Prior Functional Status   Lives at home with husband. Needs hoyer lift  for transfers. Pt describes dramatic decline in health s/p her cholecystectomy    General Date of Onset: 11/29/14 Other Pertinent Information: Mrs. Rowen Hur is a 69 yo female who was admitted with SOB, coughing, fever, and chills. She has a past medical history of Brain aneurysm; Neuropathy; Diabetes mellitus; Anemia; Thyroid disease; Arthritis; Macular degeneration; Chest pain; Hypertension; Hyperlipidemia; Obesity; Abnormal stress test; cardiac catheterization (11/2013); Sleep apnea; Difficult intubation (02/08/14); COPD (chronic obstructive pulmonary disease); CHF (congestive heart failure); Brain aneurysm (2004); Diabetes mellitus without complication; and Renal disorder. Patient says that she has difficulty speaking and swallowing since she had cholecystectomy in September last year. She initially did okay following cholecystectomy but then she developed hypoventilation and had reintubated.She also complains of weakness in the right lower extremity which has been going on for many months, she is unable to be awakened on the right side. MRI lumbar spine done in 2013 showed patient had right-sided laminotomy L5-S1 disc degeneration and spondylosis without recurrent disc protrusion. SLP asked to evaluate swallow due to history of dysphagia with need for diet modification at [facility] March 2016.  [facility] sent records, but it did not include MBSS that was apparently completed there. Type of Study: Bedside swallow evaluation Diet Prior to this Study: Regular;Honey-thick liquids Temperature Spikes Noted: No Respiratory Status: Supplemental O2 delivered via (comment) History of Recent Intubation: No (September 2011) Behavior/Cognition: Alert;Cooperative Oral Cavity - Dentition: Adequate natural dentition/normal for age Self-Feeding Abilities: Able to feed self Patient Positioning: Upright in bed Baseline Vocal Quality: Normal Volitional Cough: Weak Volitional Swallow: Able to elicit     Oral/Motor/Sensory Function Overall Oral Motor/Sensory Function: Appears within functional limits for tasks assessed   Ice Chips Ice chips: Within functional limits   Thin Liquid Thin Liquid: Impaired Presentation: Cup;Self Fed Pharyngeal  Phase Impairments: Suspected delayed Swallow;Decreased hyoid-laryngeal movement;Multiple swallows;Cough - Immediate Other Comments:  (worse with chin tuck during this evaluation)    Nectar Thick Nectar Thick Liquid: Within functional limits Presentation: Cup;Self Fed   Honey Thick Honey Thick Liquid: Not tested   Puree Puree: Within functional limits Presentation: Self Fed;Spoon   Solid   Thank you,  Havery MorosDabney Kam Kushnir, CCC-SLP [phone]     Solid: Within functional limits Presentation: Self Fed       Gabrella Stroh 11/30/2014,6:27 PM

## 2014-11-30 NOTE — Progress Notes (Signed)
ANTIBIOTIC CONSULT NOTE  Pharmacy Consult for Vancomycin Indication: cellulitis   Allergies  Allergen Reactions  . Clarithromycin Anaphylaxis  . Levofloxacin Anaphylaxis  . Metronidazole Anaphylaxis  . Sulfonamide Derivatives Anaphylaxis  . Amoxicillin-Pot Clavulanate     Heart palpitations  . Amoxicillin-Pot Clavulanate   . Augmentin [Amoxicillin-Pot Clavulanate] Other (See Comments)    Heart palpatation  . Clarithromycin   . Clindamycin/Lincomycin Diarrhea and Nausea And Vomiting  . Clindamycin/Lincomycin Diarrhea  . Lansoprazole     SOB, heart racing when took Prevpac  . Metronidazole   . Sulfamethoxazole-Trimethoprim Swelling    Patient Measurements: Height: 5\' 11"  (180.3 cm) Weight: 242 lb 9.6 oz (110.043 kg) IBW/kg (Calculated) : 70.8 Vital Signs: Temp: 97.7 F (36.5 C) (06/30 0435) Temp Source: Oral (06/30 0435) BP: 138/77 mmHg (06/30 0435) Pulse Rate: 91 (06/30 0435) Intake/Output from previous day: 06/29 0701 - 06/30 0700 In: 200 [IV Piggyback:200] Out: -  Intake/Output from this shift:    Labs:  Recent Labs  11/29/14 1645 11/30/14 0549  WBC 11.2* 8.1  HGB 11.3* 11.6*  PLT 278 253  CREATININE 1.38* 1.26*   Estimated Creatinine Clearance: 58.4 mL/min (by C-G formula based on Cr of 1.26). No results for input(s): VANCOTROUGH, VANCOPEAK, VANCORANDOM, GENTTROUGH, GENTPEAK, GENTRANDOM, TOBRATROUGH, TOBRAPEAK, TOBRARND, AMIKACINPEAK, AMIKACINTROU, AMIKACIN in the last 72 hours.   Microbiology: Recent Results (from the past 720 hour(s))  Urine culture     Status: None   Collection Time: 11/17/14  6:35 AM  Result Value Ref Range Status   Specimen Description URINE, CATHETERIZED  Final   Special Requests NONE  Final   Culture   Final    NO GROWTH 2 DAYS Performed at [facility]    Report Status 11/19/2014 FINAL  Final    Anti-infectives    Start     Dose/Rate Route Frequency Ordered Stop   11/30/14 2300  vancomycin (VANCOCIN) 1,500 mg in  sodium chloride 0.9 % 500 mL IVPB     1,500 mg 250 mL/hr over 120 Minutes Intravenous Every 24 hours 11/29/14 2102     11/29/14 2300  vancomycin (VANCOCIN) IVPB 1000 mg/200 mL premix     1,000 mg 200 mL/hr over 60 Minutes Intravenous  Once 11/29/14 2102 11/29/14 2349   11/29/14 2200  vancomycin (VANCOCIN) IVPB 1000 mg/200 mL premix     1,000 mg 200 mL/hr over 60 Minutes Intravenous  Once 11/29/14 2102 11/29/14 2235     Assessment: Okay for Protocol, Obesity/Normalized CrCl Vancomycin dosing protocol will be initiated with an estimated normalized CrCl = 48 ml/min. Multiple ABX allergies noted.  Updated weight on admission noted (decreased).  Goal of Therapy:  Vancomycin trough level 10-15 mcg/ml  Plan:  Empirically decrease to 1250mg  IV every 24 hours due to updated weight.. Measure antibiotic drug levels at steady state Follow up culture results  Mady GemmaHayes, Rashi Giuliani R 11/30/2014,1:31 PM

## 2014-11-30 NOTE — Clinical Social Work Note (Signed)
CSW received consult for possible placement. Will await PT recommendations and follow up with family.  Derenda FennelKara Hy Swiatek, LCSW [phone]

## 2014-11-30 NOTE — Progress Notes (Signed)
[facility] PROGRESS NOTE  Samantha RubensteinBarbara A [name] ZOX:[id number] DOB: 05/29/1946 DOA: 11/29/2014 PCP: Colette RibasGOLDING, [name], MD  Assessment/Plan: COPD exacerbation  On prn oxygen at home. Chest xray without acute changes.  Not much improvement reportedly this am. Oxygen saturation level 96% on 2L. Continue  IV steroids every 6 hours, Xopenex neb l every 6 hours, ipratropium neb every 6 hours. Hx dysphagia and poor cough effort. Concern for aspiration as well. [facility] for swallow eval, use thickener as HH had recommended. Wean oxygen as able.    Right leg cellulitis Mild and somewhat chronic erythema and warmth in the right lower extremity. Hx of same. Await venous Doppler to rule out DVT. Continue  Vancomycin day #2 for possible right leg cellulitis. Will narrow tomorrow.   Diabetes mellitus On oral agents at home. Will obtain A1c. CBG range 176-245. Continue sliding scale insulin with NovoLog.  Right leg weakness, chronic Patient has chronic right leg weakness. History degenerative disc disease, affecting L5-S1. Bedbound at home. Await MRI lumbar spine  UTI Obtain urine culture. Afebrile and non-toxic currently. Continue antibiotics as noted. Urine output good  CKD II Creatine stable at baseline. Mont[place].   Mild prolonged QT Patient has mild prolonged QT interval 514 on EKG. Magnesium level low end of normal. Will replete x3 days. Recheck. No events on tele.   DVT Prophlaxis 1. Lovenox  Code Status: full Family Communication: husband and daughter at bedside  Disposition Plan: daughter teary and frustrated at pt chronic situation and unable to care for her at home. Would likely benefit placement    Consultants:  none  Procedures:  none  Antibiotics:  vancomycin  HPI/Subjective: Sitting up in bed very teary and frustrated. Denies pain but reports no improvement in breathing.   Objective: Filed Vitals:   11/30/14 0435  BP: 138/77   Pulse: 91  Temp: 97.7 F (36.5 C)  Resp: 19    Intake/Output Summary (Last 24 hours) at 11/30/14 1255 Last data filed at 11/30/14 0528  Gross per 24 hour  Intake    200 ml  Output      0 ml  Net    200 ml   Filed Weights   11/29/14 1555 11/29/14 2040 11/30/14 0435  Weight: 129.275 kg (285 lb) 109.544 kg (241 lb 8 oz) 110.043 kg (242 lb 9.6 oz)    Exam:   General:  Obese appears chronically ill  Cardiovascular: RRR no MGR 1+LE edema bilateral feet  Respiratory: mild increased work of breathing with conversation, decreased BS in bases, bilateral rhonchi, no wheeze no rales very poor cough effort and moist non-productive cough  Abdomen: obese non-distended soft +BS  Musculoskeletal: no clubbing or cyanosis   Data Reviewed: Basic Metabolic Panel:  Recent Labs Lab 11/29/14 1645 11/30/14 0549  NA 142 140  K 3.5 4.1  CL 102 104  CO2 30 28  GLUCOSE 129* 184*  BUN 20 21*  CREATININE 1.38* 1.26*  CALCIUM 8.9 8.9  MG 1.6*  --    Liver Function Tests:  Recent Labs Lab 11/30/14 0549  AST 30  ALT 32  ALKPHOS 90  BILITOT 0.8  PROT 7.5  ALBUMIN 3.3*   No results for input(s): LIPASE, AMYLASE in the last 168 hours. No results for input(s): AMMONIA in the last 168 hours. CBC:  Recent Labs Lab 11/29/14 1645 11/30/14 0549  WBC 11.2* 8.1  NEUTROABS 8.0*  --   HGB 11.3* 11.6*  HCT 37.3 38.2  MCV  88.0 87.4  PLT 278 253   Cardiac Enzymes: No results for input(s): CKTOTAL, CKMB, CKMBINDEX, TROPONINI in the last 168 hours. BNP (last 3 results)  Recent Labs  10/18/14 1829  BNP 53.0    ProBNP (last 3 results) No results for input(s): PROBNP in the last 8760 hours.  CBG:  Recent Labs Lab 11/29/14 2120 11/30/14 0806 11/30/14 1123  GLUCAP 143* 176* 245*    No results found for this or any previous visit (from the past 240 hour(s)).   Studies: Dg Chest Portable 1 View  11/29/2014   CLINICAL DATA:  Shortness of breath with nonproductive cough and  congestion for 3 days. History of congestive heart failure, hypertension, COPD and diabetes. Initial encounter.  EXAM: PORTABLE CHEST - 1 VIEW  COMPARISON:  08/22/2014 and 02/09/2014.  FINDINGS: 1621 hours. There are low lung volumes with asymmetric elevation the left hemidiaphragm and mild bibasilar atelectasis, similar to the prior studies. No edema, confluent airspace opacity or significant pleural effusion identified. The heart size and mediastinal contours are stable. There is mild atherosclerosis of the aortic arch. Mild thoracic spine degenerative changes are noted. Telemetry leads overlie the chest.  IMPRESSION: Stable chest with chronic mild bibasilar atelectasis. No acute findings.   Electronically Signed   By: Carey Bullocks M.D.   On: 11/29/2014 16:32    Scheduled Meds: . antiseptic oral rinse  7 mL Mouth Rinse BID  . aspirin  162 mg Oral Daily  . carvedilol  25 mg Oral BID WC  . enoxaparin (LOVENOX) injection  70 mg Subcutaneous Q24H  . ezetimibe  10 mg Oral Daily  . furosemide  80 mg Oral Daily  . gabapentin  100 mg Oral TID  . insulin aspart  0-9 Units Subcutaneous TID WC  . ipratropium  0.5 mg Nebulization QID  . levalbuterol  0.63 mg Nebulization QID  . levothyroxine  112 mcg Oral QAC breakfast  . magnesium oxide  400 mg Oral Daily  . methylPREDNISolone (SOLU-MEDROL) injection  60 mg Intravenous Q6H  . potassium chloride SA  20 mEq Oral BID  . pravastatin  80 mg Oral q1800  . sodium chloride  3 mL Intravenous Q12H  . vancomycin  1,500 mg Intravenous Q24H   Continuous Infusions:   Active Problems:   Essential hypertension   Morbid obesity   DM type 2 (diabetes mellitus, type 2)   COPD exacerbation   Cellulitis   Right leg weakness     Renal disorder    Time spent: 30 minutes    [facility]  [facility] Pager [phone]. If 7PM-7AM, please contact night-coverage at [email or web address], password [facility] 11/30/2014, 12:55 PM

## 2014-12-01 ENCOUNTER — Inpatient Hospital Stay (HOSPITAL_COMMUNITY): Payer: Commercial Managed Care - HMO

## 2014-12-01 ENCOUNTER — Encounter (HOSPITAL_COMMUNITY): Payer: Self-pay | Admitting: Internal Medicine

## 2014-12-01 DIAGNOSIS — I10 Essential (primary) hypertension: Secondary | ICD-10-CM

## 2014-12-01 DIAGNOSIS — E46 Unspecified protein-calorie malnutrition: Secondary | ICD-10-CM | POA: Diagnosis present

## 2014-12-01 LAB — BASIC METABOLIC PANEL
Anion gap: 10 (ref 5–15)
BUN: 36 mg/dL — ABNORMAL HIGH (ref 6–20)
CO2: 30 mmol/L (ref 22–32)
Calcium: 8.7 mg/dL — ABNORMAL LOW (ref 8.9–10.3)
Chloride: 103 mmol/L (ref 101–111)
Creatinine, Ser: 1.45 mg/dL — ABNORMAL HIGH (ref 0.44–1.00)
GFR calc Af Amer: 42 mL/min — ABNORMAL LOW (ref >60)
GFR calc non Af Amer: 36 mL/min — ABNORMAL LOW (ref >60)
Glucose, Bld: 195 mg/dL — ABNORMAL HIGH (ref 65–99)
Potassium: 4 mmol/L (ref 3.5–5.1)
Sodium: 143 mmol/L (ref 135–145)

## 2014-12-01 LAB — URINALYSIS, ROUTINE W REFLEX MICROSCOPIC
Bilirubin Urine: NEGATIVE
Glucose, UA: NEGATIVE mg/dL
Ketones, ur: NEGATIVE mg/dL
Leukocytes, UA: NEGATIVE
NITRITE: NEGATIVE
PH: 5.5 (ref 5.0–8.0)
PROTEIN: NEGATIVE mg/dL
Specific Gravity, Urine: 1.015 (ref 1.005–1.030)
Urobilinogen, UA: 0.2 mg/dL (ref 0.0–1.0)

## 2014-12-01 LAB — GLUCOSE, CAPILLARY
GLUCOSE-CAPILLARY: 181 mg/dL — AB (ref 65–99)
GLUCOSE-CAPILLARY: 226 mg/dL — AB (ref 65–99)
Glucose-Capillary: 260 mg/dL — ABNORMAL HIGH (ref 65–99)
Glucose-Capillary: 199 mg/dL — ABNORMAL HIGH (ref 65–99)

## 2014-12-01 LAB — HEMOGLOBIN A1C
HEMOGLOBIN A1C: 6.8 % — AB (ref 4.8–5.6)
Mean Plasma Glucose: 148 mg/dL

## 2014-12-01 LAB — CBC
HCT: 36.7 % (ref 36.0–46.0)
Hemoglobin: 11.3 g/dL — ABNORMAL LOW (ref 12.0–15.0)
MCH: 26.8 pg (ref 26.0–34.0)
MCHC: 30.8 g/dL (ref 30.0–36.0)
MCV: 87.2 fL (ref 78.0–100.0)
Platelets: 317 10*3/uL (ref 150–400)
RBC: 4.21 MIL/uL (ref 3.87–5.11)
RDW: 15.2 % (ref 11.5–15.5)
WBC: 10.8 10*3/uL — ABNORMAL HIGH (ref 4.0–10.5)

## 2014-12-01 LAB — URINE MICROSCOPIC-ADD ON

## 2014-12-01 MED ORDER — INSULIN ASPART 100 UNIT/ML ~~LOC~~ SOLN
2.0000 [IU] | Freq: Three times a day (TID) | SUBCUTANEOUS | Status: DC
  Administered 2014-12-01 – 2014-12-02 (×4): 2 [IU] via SUBCUTANEOUS

## 2014-12-01 MED ORDER — FUROSEMIDE 10 MG/ML IJ SOLN
60.0000 mg | Freq: Two times a day (BID) | INTRAMUSCULAR | Status: DC
  Administered 2014-12-01: 60 mg via INTRAVENOUS
  Filled 2014-12-01: qty 6

## 2014-12-01 NOTE — Progress Notes (Addendum)
Inpatient Diabetes Program Recommendations  AACE/ADA: New Consensus Statement on Inpatient Glycemic Control (2013)  Target Ranges:  Prepandial:   less than 140 mg/dL      Peak postprandial:   less than 180 mg/dL (1-2 hours)      Critically ill patients:  140 - 180 mg/dL   Post-prandial cbg's are running high in 2900's while on solumedrol. In addition to sensitive correction as ordered, please consider the following: Inpatient Diabetes Program Recommendations Insulin - Meal Coverage: Please consider addition of novolog meal coverage of 2- 3 units tidwc while on steroid therapy.  Thank you Lenor CoffinAnn Kaena Santori, RN, MSN, CDE  Diabetes Inpatient Program Office: [phone] Pager: [phone] 8:00 am to 5:00 pm

## 2014-12-01 NOTE — Progress Notes (Signed)
MBSS complete. Full report located under chart review in imaging section. Recommend dysphagia 3 diet, nectar thick liquids- NO straws, precautions sheet in room for strategies.  Thana AtesAmy Oleksiak, MA, CCC-SLP Pager: [phone]

## 2014-12-01 NOTE — Clinical Social Work Placement (Signed)
   CLINICAL SOCIAL WORK PLACEMENT  NOTE  Date:  12/01/2014  Patient Details  Name: Samantha RubensteinBarbara A Bowdoin MRN: [id number] Date of Birth: 1946/05/08  Clinical Social Work is seeking post-discharge placement for this patient at the Skilled  Nursing Facility level of care (*CSW will initial, date and re-position this form in  chart as items are completed):  Yes   Patient/family provided with [place] Clinical Social Work Department's list of facilities offering this level of care within the geographic area requested by the patient (or if unable, by the patient's family).  Yes   Patient/family informed of their freedom to choose among providers that offer the needed level of care, that participate in Medicare, Medicaid or managed care program needed by the patient, have an available bed and are willing to accept the patient.  Yes   Patient/family informed of [place]'s ownership interest in [facility] and [facility], as well as of the fact that they are under no obligation to receive care at these facilities.  PASRR submitted to EDS on       PASRR number received on       Existing PASRR number confirmed on 12/01/14     FL2 transmitted to all facilities in geographic area requested by pt/family on 12/01/14     FL2 transmitted to all facilities within larger geographic area on       Patient informed that his/her managed care company has contracts with or will negotiate with certain facilities, including the following:        Yes   Patient/family informed of bed offers received.  [facility]     Physician recommends and patient chooses bed at      Patient to be transferred to   on  .  Patient to be transferred to facility by       Patient family notified on   of transfer.  Name of family member notified:        PHYSICIAN       Additional Comment:  CSW spoke with Corrie DandyMary at [facility] who advised that if patient discharges this weekend or  Monday, hospital staff will have to call the on call person for [facility] at [phone] to get the authorization number.  They will also need any up to date physical therapy notes.    _______________________________________________ Annice NeedySettle, Sahalie Beth D, LCSW  [phone] 12/01/2014, 3:57 PM

## 2014-12-01 NOTE — Clinical Documentation Improvement (Signed)
Presents with COPD Exacerbation, possible UTI and Cellulitis   Nutritional Consult notes Severe Malnutrition with BMi of 33  Moderate temporal wasting, weight loss - 20% in 1 year; trouble swallowing  Recommendations: Ensure BID, Prostat BID, swallow evaluation  Please assess Nutritional Consult and render an opinion in next progress note and include in discharge summary if applicable  Severe Malnutrition   Moderate Malnutrition Mild Malnutrition Other Condition Cannot clinically determine  Thank You, Samantha [name] ,RN Clinical Documentation Specialist:  [phone]  [facility]- Health Information Management

## 2014-12-01 NOTE — Progress Notes (Signed)
[facility] PROGRESS NOTE  Samantha RubensteinBarbara A [name] ZOX:[id number] DOB: 1946/03/21 DOA: 11/29/2014 PCP: Colette RibasGOLDING, Samantha CABOT, MD  Assessment/Plan: COPD exacerbation   improving this am. Chest xray without acute changes. Oxygen saturation level 97% on 2L. Continue IV steroids every 6 hours, Xopenex neb l every 6 hours, ipratropium neb every 6 hours. Hx dysphagia and poor cough effort. Concern for aspiration as well. Modified barium swallow with moderate oral mild pharyngeal dysphagia sensorimotor in nature. Poor oral manipulation and reduced tongue retraction. See report. ST opines high risk aspiration with straws and thin liquids, weak cough. Recommending dysphagia 3 diet, nectar thick, no straws and meds crushed in puree. Recommending speech therapy twice weekly  Right leg cellulitis/edema Mild and somewhat chronic erythema. Improved to baseline. She is afebrile and non-toxic. Will stop vanc. Venous Doppler negative for DVT.  Will start IV lasix.   Diabetes mellitus On oral agents at home. A1c 6.8. CBG range 181-260. Continue sliding scale insulin with NovoLog. Have added meal coverage as well.   Right leg weakness, chronic Patient has chronic right leg weakness. History degenerative disc disease, affecting L5-S1. Bedbound at home.  Refusing MRI lumbar spine. Will need OP open MRI when able. Evaluated by PT who recommend snf. Patient agrees. Likely ready tomorrow  UTI UA from 11/17/14. Will obtain UA today. She remains afebrile and non-toxic. Appears better today. Asymptomatic. Urine output good.  CKD II Creatine stable at baseline. Montor.   Mild prolonged QT Patient has mild prolonged QT interval 514 on EKG. Magnesium level low 11/30/14. Will replete x3 days. Recheck. No events on tele.   Code Status: full Family Communication: none Disposition Plan: snf hopefully tomorrow   Consultants:  none  Procedures:  Barium swallow see below  Antibiotics:  Vancomycin for 2  days  HPI/Subjective: Sitting up in bed. Reports breathing "better" and feeling "much better"  Objective: Filed Vitals:   12/01/14 1330  BP: 145/62  Pulse: 86  Temp: 97.9 F (36.6 C)  Resp: 20    Intake/Output Summary (Last 24 hours) at 12/01/14 1535 Last data filed at 12/01/14 1200  Gross per 24 hour  Intake    970 ml  Output      0 ml  Net    970 ml   Filed Weights   11/29/14 1555 11/29/14 2040 11/30/14 0435  Weight: 129.275 kg (285 lb) 109.544 kg (241 lb 8 oz) 110.043 kg (242 lb 9.6 oz)    Exam:   General:  Obese appears chronically ill  Cardiovascular: RRR no MGR bilateral LE edema not pitting  Respiratory: mild increased effort with conversation  Abdomen: obese soft +BS non-tender  Musculoskeletal: right LE with very little erythema, swelling at baseline.   Data Reviewed: Basic Metabolic Panel:  Recent Labs Lab 11/29/14 1645 11/30/14 0549 12/01/14 0652  NA 142 140 143  K 3.5 4.1 4.0  CL 102 104 103  CO2 30 28 30   GLUCOSE 129* 184* 195*  BUN 20 21* 36*  CREATININE 1.38* 1.26* 1.45*  CALCIUM 8.9 8.9 8.7*  MG 1.6*  --   --    Liver Function Tests:  Recent Labs Lab 11/30/14 0549  AST 30  ALT 32  ALKPHOS 90  BILITOT 0.8  PROT 7.5  ALBUMIN 3.3*   No results for input(s): LIPASE, AMYLASE in the last 168 hours. No results for input(s): AMMONIA in the last 168 hours. CBC:  Recent Labs Lab 11/29/14 1645 11/30/14 0549 12/01/14 0652  WBC 11.2* 8.1 10.8*  NEUTROABS 8.0*  --   --  HGB 11.3* 11.6* 11.3*  HCT 37.3 38.2 36.7  MCV 88.0 87.4 87.2  PLT 278 253 317   Cardiac Enzymes: No results for input(s): CKTOTAL, CKMB, CKMBINDEX, TROPONINI in the last 168 hours. BNP (last 3 results)  Recent Labs  10/18/14 1829  BNP 53.0    ProBNP (last 3 results) No results for input(s): PROBNP in the last 8760 hours.  CBG:  Recent Labs Lab 11/30/14 1123 11/30/14 1646 11/30/14 2047 12/01/14 0754 12/01/14 1114  GLUCAP 245* 280* 194* 181*  260*    No results found for this or any previous visit (from the past 240 hour(s)).   Studies: US Venous Img Lower Unilateral Right  11/30/2014   CLINICAL DATA:  69 year old female with right lower extremity pain, swelling and cellulitis  EXAM: RIGHT LOWER EXTREMITY VENOUS DOPPLER ULTRASOUND  TECHNIQUE: Gray-scale sonography with graded compression, as well as color Doppler and duplex ultrasound were performed to evaluate the lower extremity deep venous systems from the level of the common femoral vein and including the common femoral, femoral, profunda femoral, popliteal and calf veins including the posterior tibial, peroneal and gastrocnemius veins when visible. The superficial great saphenous vein was also interrogated. Spectral Doppler was utilized to evaluate flow at rest and with distal augmentation maneuvers in the common femoral, femoral and popliteal veins.  COMPARISON:  None.  FINDINGS: Contralateral Common Femoral Vein: Respiratory phasicity is normal and symmetric with the symptomatic side. No evidence of thrombus. Normal compressibility.  Common Femoral Vein: No evidence of thrombus. Normal compressibility, respiratory phasicity and response to augmentation.  Saphenofemoral Junction: No evidence of thrombus. Normal compressibility and flow on color Doppler imaging.  Profunda Femoral Vein: No evidence of thrombus. Normal compressibility and flow on color Doppler imaging.  Femoral Vein: No evidence of thrombus. Normal compressibility, respiratory phasicity and response to augmentation.  Popliteal Vein: No evidence of thrombus. Normal compressibility, respiratory phasicity and response to augmentation.  Calf Veins: No evidence of thrombus. Normal compressibility and flow on color Doppler imaging.  Superficial Great Saphenous Vein: No evidence of thrombus. Normal compressibility and flow on color Doppler imaging.  Venous Reflux:  None.  Other Findings:  None.  IMPRESSION: No evidence of deep venous  thrombosis.   Electronically Signed   By: Malachy Moan M.D.   On: 11/30/2014 15:21   Dg Chest Portable 1 View  11/29/2014   CLINICAL DATA:  Shortness of breath with nonproductive cough and congestion for 3 days. History of congestive heart failure, hypertension, COPD and diabetes. Initial encounter.  EXAM: PORTABLE CHEST - 1 VIEW  COMPARISON:  08/22/2014 and 02/09/2014.  FINDINGS: 1621 hours. There are low lung volumes with asymmetric elevation the left hemidiaphragm and mild bibasilar atelectasis, similar to the prior studies. No edema, confluent airspace opacity or significant pleural effusion identified. The heart size and mediastinal contours are stable. There is mild atherosclerosis of the aortic arch. Mild thoracic spine degenerative changes are noted. Telemetry leads overlie the chest.  IMPRESSION: Stable chest with chronic mild bibasilar atelectasis. No acute findings.   Electronically Signed   By: Carey Bullocks M.D.   On: 11/29/2014 16:32   Dg Swallowing Func-speech Pathology  12/01/2014    Objective Swallowing Evaluation:    Patient Details  Name: Samantha [name] MRN: [id number] Date of Birth: 04/10/1946  Today's Date: 12/01/2014 Time: SLP Start Time (ACUTE ONLY): 1415-SLP Stop Time (ACUTE ONLY): 1450 SLP Time Calculation (min) (ACUTE ONLY): 35 min  Past Medical History:  Past Medical History  Diagnosis Date  .  Brain aneurysm   . Neuropathy   . Diabetes mellitus   . Anemia   . Thyroid disease   . Arthritis   . Macular degeneration   . Chest pain   . Hypertension   . Hyperlipidemia   . Obesity   . Abnormal stress test     -false positive, prob breast attenuation  . Hx of cardiac catheterization 11/2013    normal coronary arteries  . Sleep apnea     doesn't wear CPAP, sleeps in a lift chair  . Difficult intubation 02/08/14    re-intubated in PACU  . COPD (chronic obstructive pulmonary disease)   . CHF (congestive heart failure)   . Brain aneurysm 2004  . Diabetes mellitus without complication   . Renal  disorder    Past Surgical History:  Past Surgical History  Procedure Laterality Date  . Tonsillectomy    . Lower back    . Heel spurs    . Hammer toes    . Right knee sark    . Cerebral aneurysm repair    . Cardiac catheterization  12/22/13    done for "angina" and abnormal nuc study- normal coronary arteries  . Cardiac catheterization  2004    insignificant CAD  . Cholecystectomy N/A 02/08/2014    Procedure: LAPAROSCOPIC CHOLECYSTECTOMY;  Surgeon: Dalia Heading, MD;   Location: [facility];  Service: General;  Laterality: N/A;  . Esophagogastroduodenoscopy  2007    Dr. Jena Gauss: mottled patchy erythema of stomach, small bowel bx negative  . Left heart catheterization with coronary angiogram N/A 12/22/2013    Procedure: LEFT HEART CATHETERIZATION WITH CORONARY ANGIOGRAM;  Surgeon:  Runell Gess, MD;  Location: [facility] CATH LAB;  Service: Cardiovascular;   Laterality: N/A;  . Transthoracic echocardiogram  10/05/2012    normal EF  . Doppler echocardiography  09/27/2010    EF>55%  . Nm myoview ltd  08/26/2011    EF 51% Low risk scan  . Cardiac catheterization  11/16/2002  . Lower ext arterial dopplers   11/11/2012    Abnormal dopplers  . Lower ext arterial dopplers  04/10/2011    abnormal dopplers  . Carotid duplex  04/10/2011    mildly abnormal carotid duplex  . Abdominal aortogram  05/07/2006  . Sleep study  08/10/2011  . Cpap report  09/03/2011  . Cholecystectomy    . Appendectomy    . Back surgery  1970   HPI:  Other Pertinent Information: Mrs. Kyree Fedorko is a 69 yo female who was  admitted with SOB, coughing, fever, and chills. She has a past medical  history of Brain aneurysm; Neuropathy; Diabetes mellitus; Anemia; Thyroid  disease; Arthritis; Macular degeneration; Chest pain; Hypertension;  Hyperlipidemia; Obesity; Abnormal stress test; cardiac catheterization  (11/2013); Sleep apnea; Difficult intubation (02/08/14); COPD (chronic  obstructive pulmonary disease); CHF (congestive heart failure); Brain  aneurysm (2004); Diabetes mellitus  without complication; and Renal  disorder. Patient says that she has difficulty speaking and swallowing  since she had cholecystectomy in September last year. She initially did  okay following cholecystectomy but then she developed hypoventilation and  had reintubated.She also complains of weakness in the right lower  extremity which has been going on for many months, she is unable to be  awakened on the right side. MRI lumbar spine done in 2013 showed patient  had right-sided laminotomy L5-S1 disc degeneration and spondylosis without  recurrent disc protrusion. SLP asked to evaluate swallow due to history of  dysphagia with need  for diet modification at [facility] March 2016. [facility]  sent records, but it did not include MBSS that was apparently completed  there.  No Data Recorded  Assessment / Plan / Recommendation CHL IP CLINICAL IMPRESSIONS 12/01/2014  Therapy Diagnosis Moderate oral phase dysphagia;Mild pharyngeal phase  dysphagia;Mild cervical esophageal phase dysphagia  Clinical Impression Pt demonstrated a moderate oral, mild pharyngeal  dysphagia which appears sensorimotor in nature. Poor oral manipulation and  reduced base of tongue retraction resulted in premature spill to the level  of the pyriform sinuses (liquids) or vallecula (solids) prior to swallow  initiation. This resulted in aspiration of nectar-thick liquids by straw  prior to swallow initiation; pt with weak cough and was unable to  adequately clear all of aspirated material. Thin liquids were penetrated  to the level of the vocal folds, likely later resulting in aspiration. No  penetration/ aspiration noted with multiple trials of cup sips of nectar  thick liquids. Mild-moderate vallecular residuals noted across  consistencies and cleared when cued to swallow a 2nd time. Also noted  minimal backflow to cervical esophagus across consistencies. Given these  findings, pt is at high risk of aspiration with straws and with thin  liquids; weak cough  increases risk as well. Risk significantly reduced  with cup sips of nectar thick liquids. Recommend continuing dysphagia 3  diet, nectar thick liquids- no straws, meds crushed in puree. Provide  intermittent supervision during meals to cue for multiple swallows with  each bite/ sip, ensure pt sitting upright 90 degrees 30-60 minutes after  meal, alternate food/ liquid. Chin tuck did not appear effective at  bedside with nectar thick liquids by straw- pt had immediate cough.  Reviewed recommendations and compensatory strategies with pt and utilized  teach-back method- pt verbalized strategies reviewed. Will continue to  follow for diet tolerance/ review of compensatory strategies.      CHL IP TREATMENT RECOMMENDATION 12/01/2014  Treatment Recommendations Therapy as outlined in treatment plan below     CHL IP DIET RECOMMENDATION 12/01/2014  SLP Diet Recommendations Dysphagia 3 (Mech soft);Nectar  Liquid Administration via (None)  Medication Administration Crushed with puree  Compensations Slow rate;Small sips/bites;Multiple dry swallows after each  bite/sip  Postural Changes and/or Swallow Maneuvers (None)     CHL IP OTHER RECOMMENDATIONS 12/01/2014  Recommended Consults (None)  Oral Care Recommendations Oral care BID  Other Recommendations Order thickener from pharmacy;Have oral suction  available;Clarify dietary restrictions     No flowsheet data found.   CHL IP FREQUENCY AND DURATION 12/01/2014  Speech Therapy Frequency (ACUTE ONLY) min 2x/week  Treatment Duration 1 week     Pertinent Vitals/Pain n/a    SLP Swallow Goals No flowsheet data found.  No flowsheet data found.    CHL IP REASON FOR REFERRAL 12/01/2014  Reason for Referral Objectively evaluate swallowing function     CHL IP ORAL PHASE 12/01/2014  Lips (None)  Tongue (None)  Mucous membranes (None)  Nutritional status (None)  Other (None)  Oxygen therapy (None)  Oral Phase Impaired  Oral - Pudding Teaspoon (None)  Oral - Pudding Cup (None)  Oral - Honey Teaspoon  (None)  Oral - Honey Cup (None)  Oral - Honey Syringe (None)  Oral - Nectar Teaspoon (None)  Oral - Nectar Cup (None)  Oral - Nectar Straw (None)  Oral - Nectar Syringe (None)  Oral - Ice Chips (None)  Oral - Thin Teaspoon (None)  Oral - Thin Cup (None)  Oral - Thin Straw (None)  Oral -  Thin Syringe (None)  Oral - Puree (None)  Oral - Mechanical Soft (None)  Oral - Regular (None)  Oral - Multi-consistency (None)  Oral - Pill (None)  Oral Phase - Comment (None)      CHL IP PHARYNGEAL PHASE 12/01/2014  Pharyngeal Phase Impaired  Pharyngeal - Pudding Teaspoon (None)  Penetration/Aspiration details (pudding teaspoon) (None)  Pharyngeal - Pudding Cup (None)  Penetration/Aspiration details (pudding cup) (None)  Pharyngeal - Honey Teaspoon (None)  Penetration/Aspiration details (honey teaspoon) (None)  Pharyngeal - Honey Cup (None)  Penetration/Aspiration details (honey cup) (None)  Pharyngeal - Honey Syringe (None)  Penetration/Aspiration details (honey syringe) (None)  Pharyngeal - Nectar Teaspoon (None)  Penetration/Aspiration details (nectar teaspoon) (None)  Pharyngeal - Nectar Cup (None)  Penetration/Aspiration details (nectar cup) (None)  Pharyngeal - Nectar Straw (None)  Penetration/Aspiration details (nectar straw) (None)  Pharyngeal - Nectar Syringe (None)  Penetration/Aspiration details (nectar syringe) (None)  Pharyngeal - Ice Chips (None)  Penetration/Aspiration details (ice chips) (None)  Pharyngeal - Thin Teaspoon (None)  Penetration/Aspiration details (thin teaspoon) (None)  Pharyngeal - Thin Cup (None)  Penetration/Aspiration details (thin cup) (None)  Pharyngeal - Thin Straw (None)  Penetration/Aspiration details (thin straw) (None)  Pharyngeal - Thin Syringe (None)  Penetration/Aspiration details (thin syringe') (None)  Pharyngeal - Puree (None)  Penetration/Aspiration details (puree) (None)  Pharyngeal - Mechanical Soft (None)  Penetration/Aspiration details (mechanical soft) (None)  Pharyngeal - Regular  (None)  Penetration/Aspiration details (regular) (None) Pharyngeal -  Multi-consistency (None)  Penetration/Aspiration details (multi-consistency) (None)  Pharyngeal - Pill (None)  Penetration/Aspiration details (pill) (None)  Pharyngeal Comment (None)      CHL IP CERVICAL ESOPHAGEAL PHASE 12/01/2014  Cervical Esophageal Phase Impaired  Pudding Teaspoon (None)  Pudding Cup (None)  Honey Teaspoon (None)  Honey Cup (None)  Honey Straw (None)  Nectar Teaspoon (None)  Nectar Cup Esophageal backflow into cervical esophagus  Nectar Straw Esophageal backflow into cervical esophagus  Nectar Sippy Cup (None)  Thin Teaspoon (None)  Thin Cup Esophageal backflow into cervical esophagus  Thin Straw (None)  Thin Sippy Cup (None)  Cervical Esophageal Comment (None)    No flowsheet data found.         Metro Kung, MA, CCC-SLP 12/01/2014, 3:14 PM  Page: [phone]     Scheduled Meds: . antiseptic oral rinse  7 mL Mouth Rinse BID  . aspirin  162 mg Oral Daily  . carvedilol  25 mg Oral BID WC  . enoxaparin (LOVENOX) injection  70 mg Subcutaneous Q24H  . ezetimibe  10 mg Oral Daily  . feeding supplement (PRO-STAT 64)  30 mL Oral BID  . furosemide  80 mg Oral Daily  . gabapentin  100 mg Oral TID  . insulin aspart  0-9 Units Subcutaneous TID WC  . insulin aspart  2 Units Subcutaneous TID WC  . ipratropium  0.5 mg Nebulization QID  . levalbuterol  0.63 mg Nebulization QID  . levothyroxine  112 mcg Oral QAC breakfast  . magnesium oxide  400 mg Oral Daily  . methylPREDNISolone (SOLU-MEDROL) injection  60 mg Intravenous Q12H  . potassium chloride SA  20 mEq Oral BID  . pravastatin  80 mg Oral q1800  . sodium chloride  3 mL Intravenous Q12H   Continuous Infusions:   Active Problems:   Essential hypertension   Morbid obesity   DM type 2 (diabetes mellitus, type 2)   COPD exacerbation   Cellulitis   Right leg weakness   CHF (congestive heart failure)  Renal disorder   UTI (urinary tract infection)    Protein-calorie malnutrition, severe   Cellulitis of right leg   Leg weakness   Malnutrition    Time spent: 35 minutes    [facility] M  [facility] Pager [phone]. If 7PM-7AM, please contact night-coverage at [email or web address], password [facility] 12/01/2014, 3:35 PM  LOS: 1 day

## 2014-12-01 NOTE — Care Management Note (Signed)
Case Management Note  Patient Details  Name: Samantha RubensteinBarbara A [name] MRN: [id number] Date of Birth: 1946-03-05  Expected Discharge Date:                  Expected Discharge Plan:  Skilled Nursing Facility  In-House Referral:  Clinical Social Work  Discharge planning Services  CM Consult  Post Acute Care Choice:  NA Choice offered to:  NA  DME Arranged:    DME Agency:     HH Arranged:    HH Agency:     Status of Service:  Completed, signed off  Medicare Important Message Given:  Yes-second notification given Date Medicare IM Given:    Medicare IM give by:    Date Additional Medicare IM Given:    Additional Medicare Important Message give by:     If discussed at Long Length of Stay Meetings, dates discussed:    Additional Comments: Pt is from home, PT has recommended SNF at [place]. CSW is aware and will arrange for placement. Discharge anticipated over weekend. No CM needs.  Malcolm Metrohildress, Kazoua Gossen Demske, RN 12/01/2014, 12:29 PM

## 2014-12-01 NOTE — Progress Notes (Signed)
Patient voiding large amount of urine in depends. Patient not aware when she voids. I &O cath done to collect urine specimen.

## 2014-12-01 NOTE — Clinical Social Work Note (Signed)
Clinical Social Work Assessment  Patient Details  Name: Samantha [name] MRN: [id number] Date of Birth: 06-06-1945  Date of referral:  12/01/14               Reason for consult:  Facility Placement                Permission sought to share information with:    Permission granted to share information::     Name::        Agency::     Relationship::     Contact Information:     Housing/Transportation Living arrangements for the past 2 months:  Single Family Home Source of Information:  Patient Patient Interpreter Needed:  None Criminal Activity/Legal Involvement Pertinent to Current Situation/Hospitalization:  No - Comment as needed Significant Relationships:  Adult Children, Spouse Lives with:  Spouse Do you feel safe going back to the place where you live?  Yes Need for family participation in patient care:  Yes (Comment)  Care giving concerns:  Patient lives at home with her husband. She is considering SNF.    Social Worker assessment / plan:  CSW met with patient who advised that she currently lives in the home with her husband.  She advised that she uses a wheelchair.  Patient stated that she receives assistance with ADLs when needed from her husband, daughter, granddaughter and her daughter's sister-in-law.  She advised that she has a very strong support system.  CSW discussed SNF placement.  Patient stated that she receives home health currently but is willing to go to SNF at [facility] only.  She advised that if she could not get in to [facility] that she was going to go home.    Employment status:  Retired Nurse, adult PT Recommendations:  [place] / Referral to community resources:  [place]  Patient/Family's Response to care: Patient is willing to go to SNF at [facility] only.    Patient/Family's Understanding of and Emotional Response to Diagnosis, Current Treatment, and Prognosis:  Patient understands his diagnosis,  current treatment and prognosis.     Emotional Assessment Appearance:  Developmentally appropriate Attitude/Demeanor/Rapport:   (Cooperative) Affect (typically observed):  Calm, Accepting Orientation:  Oriented to Self, Oriented to Place, Oriented to  Time, Oriented to Situation Alcohol / Substance use:  Not Applicable Psych involvement (Current and /or in the community):  No (Comment)  Discharge Needs  Concerns to be addressed:  Discharge Planning Concerns Readmission within the last 30 days:  No Current discharge risk:  None Barriers to Discharge:  No Barriers Identified   Ihor Gully, LCSW 12/01/2014, 3:43 PM  [phone]

## 2014-12-01 NOTE — Evaluation (Signed)
Physical Therapy Evaluation Patient Details Name: Samantha [name] MRN: [id number] DOB: 1945-12-26 Today's Date: 12/01/2014   History of Present Illness  69 year old female who  has a past medical history of Brain aneurysm; Neuropathy; Diabetes mellitus; Anemia; Thyroid disease; Arthritis; Macular degeneration; Chest pain; Hypertension; Hyperlipidemia; Obesity; Abnormal stress test; cardiac catheterization (11/2013); Sleep apnea; Difficult intubation (02/08/14); COPD (chronic obstructive pulmonary disease); CHF (congestive heart failure); Brain aneurysm (2004); Diabetes mellitus without complication; and Renal disorder.She is admitted with exacerbation of COPD.  Pt is referred to PT for evaluation of strength and mobility.  She has been non ambulatory since 9-15 when she had a stroke following surgery.  She had developed the ability to stand and transfer up until several months ago when she lost this ability due to illness.  Family is now having to use a Hoyer lift for all transfers.   Clinical Impression  Pt was seen for evaluation.  She was alert and very pleasant/oriented.  She states that she wants to be able to walk.  Based on my evaluation (see below) I think that while this is understandable it is unrealistic.  I talked with her about this and I think that she understands.  I do believe, however, that she should have the ability to develop the ability to transfer independently even if it is just with a sliding board.  For this reason, I am recommending that she go to SNF and give it a try.  Pt is agreeable.    Follow Up Recommendations SNF    Equipment Recommendations  None recommended by PT    Recommendations for Other Services   OT    Precautions / Restrictions Precautions Precautions: Fall Restrictions Weight Bearing Restrictions: No      Mobility  Bed Mobility               General bed mobility comments: unable to mobilize pt in the bed due to generalized weakness and  fatigue from recent illness...  Transfers                 General transfer comment: pt requires total assist with transfers  Ambulation/Gait   unable                                                                                  Pertinent Vitals/Pain Pain Assessment: No/denies pain    Home Living Family/patient expects to be discharged to:: Skilled nursing facility                      Prior Function Level of Independence: Needs assistance   Gait / Transfers Assistance Needed: Pt has been unable to transfer out of sitting or supine for the past several months due to illness... currently family is using a hoyer lift for transfers...she has been non ambulatory since Sept. 2015 after she had a presumed stroke.  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 strength generally 3/5     Communication   Communication: No difficulties  Cognition Arousal/Alertness: Awake/alert Behavior During Therapy: WFL for tasks assessed/performed Overall Cognitive Status: Within Functional Limits for tasks assessed                                    Assessment/Plan    PT Assessment All further PT needs can be met in the next venue of care  PT Diagnosis Generalized weakness   PT Problem List    PT Treatment Interventions     PT Goals (Current goals can be found in the Care Plan section) Acute  Rehab PT Goals PT Goal Formulation: All assessment and education complete, DC therapy    Frequency  N/A   Barriers to discharge  N/A                     End of Session Equipment Utilized During Treatment: Oxygen Activity Tolerance: Patient limited by fatigue Patient left: in bed;with call bell/phone within reach;with bed alarm set;with nursing/sitter in room           Time: 3870-6582 PT Time Calculation (min) (ACUTE ONLY): 34 min   Charges:   PT Evaluation $Initial PT Evaluation Tier I: 1 Procedure     PT G CodesDemetrios Isaacs L  PT 12/01/2014, 12:01 PM [phone]

## 2014-12-01 NOTE — Care Management (Signed)
Important Message  Patient Details  Name: Samantha [name] MRN: [id number] Date of Birth: 1946/04/24   Medicare Important Message Given:  Yes-second notification given    Malcolm MetroChildress, Jeannene Tschetter Demske, RN 12/01/2014, 12:28 PM

## 2014-12-02 DIAGNOSIS — R1314 Dysphagia, pharyngoesophageal phase: Secondary | ICD-10-CM

## 2014-12-02 DIAGNOSIS — J9611 Chronic respiratory failure with hypoxia: Secondary | ICD-10-CM

## 2014-12-02 DIAGNOSIS — T17998D Other foreign object in respiratory tract, part unspecified causing other injury, subsequent encounter: Secondary | ICD-10-CM

## 2014-12-02 LAB — BASIC METABOLIC PANEL
Anion gap: 12 (ref 5–15)
BUN: 50 mg/dL — ABNORMAL HIGH (ref 6–20)
CHLORIDE: 98 mmol/L — AB (ref 101–111)
CO2: 32 mmol/L (ref 22–32)
Calcium: 8.5 mg/dL — ABNORMAL LOW (ref 8.9–10.3)
Creatinine, Ser: 1.37 mg/dL — ABNORMAL HIGH (ref 0.44–1.00)
GFR, EST AFRICAN AMERICAN: 45 mL/min — AB (ref >60)
GFR, EST NON AFRICAN AMERICAN: 39 mL/min — AB (ref >60)
Glucose, Bld: 188 mg/dL — ABNORMAL HIGH (ref 65–99)
Potassium: 3.6 mmol/L (ref 3.5–5.1)
Sodium: 142 mmol/L (ref 135–145)

## 2014-12-02 LAB — GLUCOSE, CAPILLARY
GLUCOSE-CAPILLARY: 117 mg/dL — AB (ref 65–99)
Glucose-Capillary: 174 mg/dL — ABNORMAL HIGH (ref 65–99)
Glucose-Capillary: 200 mg/dL — ABNORMAL HIGH (ref 65–99)

## 2014-12-02 MED ORDER — MAGIC MOUTHWASH W/LIDOCAINE: 15.0000 mL | Freq: Three times a day (TID) | ORAL | Status: DC

## 2014-12-02 MED ORDER — LIDOCAINE VISCOUS 2 % MT SOLN
10.0000 mL | Freq: Three times a day (TID) | OROMUCOSAL | Status: DC
  Administered 2014-12-02: 10 mL via OROMUCOSAL
  Filled 2014-12-02: qty 15

## 2014-12-02 MED ORDER — MAGIC MOUTHWASH
10.0000 mL | Freq: Three times a day (TID) | ORAL | Status: DC
  Administered 2014-12-02: 10 mL via ORAL
  Filled 2014-12-02: qty 10

## 2014-12-02 MED ORDER — MAGIC MOUTHWASH: 10.0000 mL | Freq: Three times a day (TID) | ORAL | Status: AC

## 2014-12-02 MED ORDER — HYDROCODONE-ACETAMINOPHEN 7.5-325 MG/15ML PO SOLN: ORAL | Status: DC

## 2014-12-02 MED ORDER — FLUCONAZOLE 100 MG PO TABS
100.0000 mg | ORAL_TABLET | Freq: Every day | ORAL | Status: DC
  Administered 2014-12-02: 100 mg via ORAL
  Filled 2014-12-02: qty 1

## 2014-12-02 MED ORDER — GUAIFENESIN 100 MG/5ML PO SOLN
200.0000 mg | Freq: Three times a day (TID) | ORAL | Status: DC
Start: 2014-12-02 — End: 2014-12-02
  Administered 2014-12-02: 200 mg via ORAL
  Filled 2014-12-02: qty 5

## 2014-12-02 MED ORDER — MAGNESIUM CITRATE PO SOLN
1.0000 | Freq: Once | ORAL | Status: AC
  Administered 2014-12-02: 1 via ORAL
  Filled 2014-12-02: qty 296

## 2014-12-02 MED ORDER — FLUCONAZOLE 100 MG PO TABS: 100.0000 mg | ORAL_TABLET | Freq: Every day | ORAL | Status: AC

## 2014-12-02 MED ORDER — LIDOCAINE VISCOUS 2 % MT SOLN: 10.0000 mL | Freq: Three times a day (TID) | OROMUCOSAL | Status: AC

## 2014-12-02 MED ORDER — PREDNISONE 20 MG PO TABS: ORAL_TABLET | ORAL | Status: AC

## 2014-12-02 NOTE — Discharge Summary (Signed)
Physician Discharge Summary  Samantha [name]:[id number] DOB: January 09, 1946 DOA: 11/29/2014  PCP: Colette Ribas, MD  Admit date: 11/29/2014 Discharge date: 12/02/2014  Time spent: 40 minutes  Recommendations for Outpatient Follow-up:  1. Patient will be discharged to [facility] nursing center. 2. Continue physical therapy and speech therapy  Discharge Diagnoses:  Active Problems:   Essential hypertension   Morbid obesity   DM type 2 (diabetes mellitus, type 2)   COPD exacerbation   Right leg weakness, chronic   Protein-calorie malnutrition, severe   Venous stasis of lower extremities   Leg weakness   Malnutrition   Chronic respiratory failure with hypoxia   Dysphagia with aspiration   Discharge Condition: stable  Diet recommendation: dysphagia 3 diet with nectar thick liquids  Filed Weights   11/29/14 1555 11/29/14 2040 11/30/14 0435  Weight: 129.275 kg (285 lb) 109.544 kg (241 lb 8 oz) 110.043 kg (242 lb 9.6 oz)    History of present illness:  This patient presents to the hospital with complaints of shortness of breath. She was coughing of white colored phlegm. She denies any chest pain. She was using oxygen at home on a necessary basis. She was evaluated in the emergency room and felt COPD exacerbation and admitted to the hospital for further treatments.  Hospital Course:  COPD exacerbation. Improved with bronchodilators, steroids and antibiotic. She no longer has any wheezing or shortness of breath. She is continued on supplemental oxygen.  Aspiration. Patient has significant dysphagia and aspiration. She was seen by speech therapy and started on a dysphagia 3 diet with nectar thick liquids. She will need continued speech therapy evaluation at the skilled facility.  Chronic right leg weakness. MRI was initially ordered to evaluate her lumbar spine, but she reports that she's had weakness in her leg for almost a year now. This is a chronic issue and can be further evaluated  on an outpatient basis if felt necessary. It is likely that her weakness is related to degenerative disease in her lumbar spine. She was evaluated by physical therapy who recommended skilled nursing facility placement.  Right leg cellulitis/edema. Appeared to be somewhat chronic erythema, more related to venous stasis. She did receive vancomycin on admission, but this was discontinued. She's not had any fever or worsening erythema. I suspect her leg findings are more related to venous stasis. Continue oral Lasix.    Procedures:    Consultations:    Discharge Exam: Filed Vitals:   12/02/14 1316  BP: 130/64  Pulse: 83  Temp: 97.8 F (36.6 C)  Resp: 20    General: NAD Cardiovascular: S1, S2 RRR Respiratory: clear bilaterally  Discharge Instructions   Discharge Instructions    Diet - low sodium heart healthy    Complete by:  As directed      Increase activity slowly    Complete by:  As directed           Current Discharge Medication List    START taking these medications   Details  Alum & Mag Hydroxide-Simeth (MAGIC MOUTHWASH) SOLN Take 10 mLs by mouth 3 (three) times daily. Refills: 0    fluconazole (DIFLUCAN) 100 MG tablet Take 1 tablet (100 mg total) by mouth daily. For 7 days Qty: 7 tablet, Refills: 0    lidocaine (XYLOCAINE) 2 % solution Use as directed 10 mLs in the mouth or throat 3 (three) times daily. Qty: 100 mL, Refills: 0    predniSONE (DELTASONE) 20 MG tablet Take 40mg  po daily for  3 more days      CONTINUE these medications which have CHANGED   Details  HYDROcodone-acetaminophen (HYCET) 7.5-325 mg/15 ml solution Take PO q 8 hours PRN for pain Qty: 118 mL, Refills: 0      CONTINUE these medications which have NOT CHANGED   Details  aspirin 81 MG tablet Take 162 mg by mouth daily.     BREO ELLIPTA 100-25 MCG/INH AEPB Inhale 1 puff into the lungs daily.    carvedilol (COREG) 25 MG tablet Take 1 tablet (25 mg total) by mouth 2 (two) times  daily with a meal. Qty: 60 tablet, Refills: 11    Coenzyme Q10 (CO Q 10) 100 MG CAPS Take 100 mg by mouth 2 (two) times daily.     ferrous sulfate 325 (65 FE) MG tablet Take 325 mg by mouth daily.    folic acid (FOLVITE) 1 MG tablet Take 1 tablet (1 mg total) by mouth daily. Qty: 90 tablet, Refills: 3    furosemide (LASIX) 40 MG tablet Take 80 mg by mouth daily.    gabapentin (NEURONTIN) 100 MG capsule Take 100 mg by mouth 3 (three) times daily.    levalbuterol (XOPENEX) 0.63 MG/3ML nebulizer solution Take 3 mLs by nebulization every 6 (six) hours as needed for wheezing or shortness of breath.     levothyroxine (SYNTHROID, LEVOTHROID) 112 MCG tablet Take 112 mcg by mouth daily before breakfast.    niacin 500 MG tablet Take 500 mg by mouth at bedtime.     ondansetron (ZOFRAN ODT) 8 MG disintegrating tablet Take 1 tablet (8 mg total) by mouth every 8 (eight) hours as needed. 8mg  ODT q4 hours prn nausea Qty: 12 tablet, Refills: 0    potassium chloride SA (K-DUR,KLOR-CON) 20 MEQ tablet Take 20 mEq by mouth 2 (two) times daily.    pravastatin (PRAVACHOL) 80 MG tablet Take 1 tablet (80 mg total) by mouth daily. Qty: 90 tablet, Refills: 3    ranitidine (ZANTAC) 150 MG tablet Take 150 mg by mouth at bedtime.    SPIRIVA HANDIHALER 18 MCG inhalation capsule Take 18 mcg by mouth daily.    ZETIA 10 MG tablet TAKE ONE TABLET BY MOUTH ONCE DAILY. Qty: 30 tablet, Refills: 11      STOP taking these medications     glipiZIDE (GLUCOTROL) 10 MG tablet      traMADol (ULTRAM) 50 MG tablet        Allergies  Allergen Reactions  . Clarithromycin Anaphylaxis  . Levofloxacin Anaphylaxis  . Metronidazole Anaphylaxis  . Sulfonamide Derivatives Anaphylaxis  . Amoxicillin-Pot Clavulanate     Heart palpitations  . Amoxicillin-Pot Clavulanate   . Augmentin [Amoxicillin-Pot Clavulanate] Other (See Comments)    Heart palpatation  . Clarithromycin   . Clindamycin/Lincomycin Diarrhea and Nausea  And Vomiting  . Clindamycin/Lincomycin Diarrhea  . Lansoprazole     SOB, heart racing when took Prevpac  . Metronidazole   . Sulfamethoxazole-Trimethoprim Swelling   Follow-up Information    Follow up with Colette Ribas, MD.   Specialty:  Family Medicine   Why:  after discharge from SNF   Contact information:   [address] [place] [address] [phone]        The results of significant diagnostics from this hospitalization (including imaging, microbiology, ancillary and laboratory) are listed below for reference.    Significant Diagnostic Studies: Ct Abdomen Pelvis Wo Contrast  11/17/2014   ADDENDUM REPORT: 11/17/2014 06:54  ADDENDUM: Patient has had a previous appendectomy.  The right lower quadrant structure which was reported as a normal appendix probably represents a decompressed loop of adjacent small bowel.   Electronically Signed   By: Burman Nieves M.D.   On: 11/17/2014 06:54   11/17/2014   CLINICAL DATA:  Lower right-side abdominal pain. Low back pain. Elevated creatinine.  EXAM: CT ABDOMEN AND PELVIS WITHOUT CONTRAST  TECHNIQUE: Multidetector CT imaging of the abdomen and pelvis was performed following the standard protocol without IV contrast.  COMPARISON:  03/16/2014  FINDINGS: Mild atelectasis in the lung bases.  Coronary artery calcifications.  Vague low-attenuation lesion demonstrated in segment 4 of the liver measuring about 1.5 cm diameter. This is difficult to characterize without contrast material. Suggest follow-up with MRI in the elective setting. Surgical absence of the gallbladder. No bile duct dilatation. Unenhanced appearance of the pancreas, spleen, inferior vena cava, and retroperitoneal lymph nodes is unremarkable. Prominent calcification of the abdominal aorta without aneurysm. Multiple stones demonstrated in both kidneys. Largest stone on the left is in the renal pelvis and measures about 1.8 cm maximal diameter. Largest stone in the right  is in the upper pole calyx and measures about 13 mm diameter. Stones at enlarged since previous study. Stone in the left renal pelvis has developed. 3 mm stone in the right proximal ureter at the ureteropelvic junction with mild proximal hydronephrosis and stranding around the right kidney. The distal ureter is decompressed. No additional ureteral stones demonstrated. The stomach, small bowel, and colon are not abnormally distended. No free air or free fluid in the abdomen. Small periumbilical hernia containing fat. 2.8 cm diameter right adrenal gland nodule with fat attenuation consistent with adenoma.  Pelvis: Bladder is decompressed. Uterus and ovaries are not enlarged. Appendix is normal. Rectosigmoid colon is not inflamed. No free or loculated pelvic fluid collections. No pelvic mass or lymphadenopathy. Degenerative changes in the spine. No destructive bone lesions.  IMPRESSION: 3 mm stone in the proximal right ureter with moderate proximal obstruction likely accounts for patient's right abdominal pain. Multiple bilateral intrarenal stones. Indeterminate lesion in segment 4 of the liver. Consider follow-up with elective MRI. Right adrenal gland adenoma. Small periumbilical hernia containing fat.  Electronically Signed: By: Burman Nieves M.D. On: 11/17/2014 06:23   US Venous Img Lower Unilateral Right  11/30/2014   CLINICAL DATA:  69 year old female with right lower extremity pain, swelling and cellulitis  EXAM: RIGHT LOWER EXTREMITY VENOUS DOPPLER ULTRASOUND  TECHNIQUE: Gray-scale sonography with graded compression, as well as color Doppler and duplex ultrasound were performed to evaluate the lower extremity deep venous systems from the level of the common femoral vein and including the common femoral, femoral, profunda femoral, popliteal and calf veins including the posterior tibial, peroneal and gastrocnemius veins when visible. The superficial great saphenous vein was also interrogated. Spectral  Doppler was utilized to evaluate flow at rest and with distal augmentation maneuvers in the common femoral, femoral and popliteal veins.  COMPARISON:  None.  FINDINGS: Contralateral Common Femoral Vein: Respiratory phasicity is normal and symmetric with the symptomatic side. No evidence of thrombus. Normal compressibility.  Common Femoral Vein: No evidence of thrombus. Normal compressibility, respiratory phasicity and response to augmentation.  Saphenofemoral Junction: No evidence of thrombus. Normal compressibility and flow on color Doppler imaging.  Profunda Femoral Vein: No evidence of thrombus. Normal compressibility and flow on color Doppler imaging.  Femoral Vein: No evidence of thrombus. Normal compressibility, respiratory phasicity and response to augmentation.  Popliteal Vein: No evidence of thrombus. Normal compressibility, respiratory  phasicity and response to augmentation.  Calf Veins: No evidence of thrombus. Normal compressibility and flow on color Doppler imaging.  Superficial Great Saphenous Vein: No evidence of thrombus. Normal compressibility and flow on color Doppler imaging.  Venous Reflux:  None.  Other Findings:  None.  IMPRESSION: No evidence of deep venous thrombosis.   Electronically Signed   By: Malachy Moan M.D.   On: 11/30/2014 15:21   Dg Chest Portable 1 View  11/29/2014   CLINICAL DATA:  Shortness of breath with nonproductive cough and congestion for 3 days. History of congestive heart failure, hypertension, COPD and diabetes. Initial encounter.  EXAM: PORTABLE CHEST - 1 VIEW  COMPARISON:  08/22/2014 and 02/09/2014.  FINDINGS: 1621 hours. There are low lung volumes with asymmetric elevation the left hemidiaphragm and mild bibasilar atelectasis, similar to the prior studies. No edema, confluent airspace opacity or significant pleural effusion identified. The heart size and mediastinal contours are stable. There is mild atherosclerosis of the aortic arch. Mild thoracic spine  degenerative changes are noted. Telemetry leads overlie the chest.  IMPRESSION: Stable chest with chronic mild bibasilar atelectasis. No acute findings.   Electronically Signed   By: Carey Bullocks M.D.   On: 11/29/2014 16:32   Dg Swallowing Func-speech Pathology  12/01/2014    Objective Swallowing Evaluation:    Patient Details  Name: JAYLENN BAIZA MRN: [id number] Date of Birth: 10-19-1945  Today's Date: 12/01/2014 Time: SLP Start Time (ACUTE ONLY): 1415-SLP Stop Time (ACUTE ONLY): 1450 SLP Time Calculation (min) (ACUTE ONLY): 35 min  Past Medical History:  Past Medical History  Diagnosis Date  . Brain aneurysm   . Neuropathy   . Diabetes mellitus   . Anemia   . Thyroid disease   . Arthritis   . Macular degeneration   . Chest pain   . Hypertension   . Hyperlipidemia   . Obesity   . Abnormal stress test     -false positive, prob breast attenuation  . Hx of cardiac catheterization 11/2013    normal coronary arteries  . Sleep apnea     doesn't wear CPAP, sleeps in a lift chair  . Difficult intubation 02/08/14    re-intubated in PACU  . COPD (chronic obstructive pulmonary disease)   . CHF (congestive heart failure)   . Brain aneurysm 2004  . Diabetes mellitus without complication   . Renal disorder    Past Surgical History:  Past Surgical History  Procedure Laterality Date  . Tonsillectomy    . Lower back    . Heel spurs    . Hammer toes    . Right knee sark    . Cerebral aneurysm repair    . Cardiac catheterization  12/22/13    done for "angina" and abnormal nuc study- normal coronary arteries  . Cardiac catheterization  2004    insignificant CAD  . Cholecystectomy N/A 02/08/2014    Procedure: LAPAROSCOPIC CHOLECYSTECTOMY;  Surgeon: Dalia Heading, MD;   Location: [facility];  Service: General;  Laterality: N/A;  . Esophagogastroduodenoscopy  2007    Dr. Jena Gauss: mottled patchy erythema of stomach, small bowel bx negative  . Left heart catheterization with coronary angiogram N/A 12/22/2013    Procedure: LEFT HEART CATHETERIZATION  WITH CORONARY ANGIOGRAM;  Surgeon:  Runell Gess, MD;  Location: [facility] CATH LAB;  Service: Cardiovascular;   Laterality: N/A;  . Transthoracic echocardiogram  10/05/2012    normal EF  . Doppler echocardiography  09/27/2010    EF>55%  .  Nm myoview ltd  08/26/2011    EF 51% Low risk scan  . Cardiac catheterization  11/16/2002  . Lower ext arterial dopplers   11/11/2012    Abnormal dopplers  . Lower ext arterial dopplers  04/10/2011    abnormal dopplers  . Carotid duplex  04/10/2011    mildly abnormal carotid duplex  . Abdominal aortogram  05/07/2006  . Sleep study  08/10/2011  . Cpap report  09/03/2011  . Cholecystectomy    . Appendectomy    . Back surgery  1970   HPI:  Other Pertinent Information: Mrs. Antwan Bribiesca is a 69 yo female who was  admitted with SOB, coughing, fever, and chills. She has a past medical  history of Brain aneurysm; Neuropathy; Diabetes mellitus; Anemia; Thyroid  disease; Arthritis; Macular degeneration; Chest pain; Hypertension;  Hyperlipidemia; Obesity; Abnormal stress test; cardiac catheterization  (11/2013); Sleep apnea; Difficult intubation (02/08/14); COPD (chronic  obstructive pulmonary disease); CHF (congestive heart failure); Brain  aneurysm (2004); Diabetes mellitus without complication; and Renal  disorder. Patient says that she has difficulty speaking and swallowing  since she had cholecystectomy in September last year. She initially did  okay following cholecystectomy but then she developed hypoventilation and  had reintubated.She also complains of weakness in the right lower  extremity which has been going on for many months, she is unable to be  awakened on the right side. MRI lumbar spine done in 2013 showed patient  had right-sided laminotomy L5-S1 disc degeneration and spondylosis without  recurrent disc protrusion. SLP asked to evaluate swallow due to history of  dysphagia with need for diet modification at [facility] March 2016. [facility]  sent records, but it did not include MBSS that was  apparently completed  there.  No Data Recorded  Assessment / Plan / Recommendation CHL IP CLINICAL IMPRESSIONS 12/01/2014  Therapy Diagnosis Moderate oral phase dysphagia;Mild pharyngeal phase  dysphagia;Mild cervical esophageal phase dysphagia  Clinical Impression Pt demonstrated a moderate oral, mild pharyngeal  dysphagia which appears sensorimotor in nature. Poor oral manipulation and  reduced base of tongue retraction resulted in premature spill to the level  of the pyriform sinuses (liquids) or vallecula (solids) prior to swallow  initiation. This resulted in aspiration of nectar-thick liquids by straw  prior to swallow initiation; pt with weak cough and was unable to  adequately clear all of aspirated material. Thin liquids were penetrated  to the level of the vocal folds, likely later resulting in aspiration. No  penetration/ aspiration noted with multiple trials of cup sips of nectar  thick liquids. Mild-moderate vallecular residuals noted across  consistencies and cleared when cued to swallow a 2nd time. Also noted  minimal backflow to cervical esophagus across consistencies. Given these  findings, pt is at high risk of aspiration with straws and with thin  liquids; weak cough increases risk as well. Risk significantly reduced  with cup sips of nectar thick liquids. Recommend continuing dysphagia 3  diet, nectar thick liquids- no straws, meds crushed in puree. Provide  intermittent supervision during meals to cue for multiple swallows with  each bite/ sip, ensure pt sitting upright 90 degrees 30-60 minutes after  meal, alternate food/ liquid. Chin tuck did not appear effective at  bedside with nectar thick liquids by straw- pt had immediate cough.  Reviewed recommendations and compensatory strategies with pt and utilized  teach-back method- pt verbalized strategies reviewed. Will continue to  follow for diet tolerance/ review of compensatory strategies.      CHL  IP TREATMENT RECOMMENDATION 12/01/2014  Treatment  Recommendations Therapy as outlined in treatment plan below     CHL IP DIET RECOMMENDATION 12/01/2014  SLP Diet Recommendations Dysphagia 3 (Mech soft);Nectar  Liquid Administration via (None)  Medication Administration Crushed with puree  Compensations Slow rate;Small sips/bites;Multiple dry swallows after each  bite/sip  Postural Changes and/or Swallow Maneuvers (None)     CHL IP OTHER RECOMMENDATIONS 12/01/2014  Recommended Consults (None)  Oral Care Recommendations Oral care BID  Other Recommendations Order thickener from pharmacy;Have oral suction  available;Clarify dietary restrictions     No flowsheet data found.   CHL IP FREQUENCY AND DURATION 12/01/2014  Speech Therapy Frequency (ACUTE ONLY) min 2x/week  Treatment Duration 1 week     Pertinent Vitals/Pain n/a    SLP Swallow Goals No flowsheet data found.  No flowsheet data found.    CHL IP REASON FOR REFERRAL 12/01/2014  Reason for Referral Objectively evaluate swallowing function     CHL IP ORAL PHASE 12/01/2014  Lips (None)  Tongue (None)  Mucous membranes (None)  Nutritional status (None)  Other (None)  Oxygen therapy (None)  Oral Phase Impaired  Oral - Pudding Teaspoon (None)  Oral - Pudding Cup (None)  Oral - Honey Teaspoon (None)  Oral - Honey Cup (None)  Oral - Honey Syringe (None)  Oral - Nectar Teaspoon (None)  Oral - Nectar Cup (None)  Oral - Nectar Straw (None)  Oral - Nectar Syringe (None)  Oral - Ice Chips (None)  Oral - Thin Teaspoon (None)  Oral - Thin Cup (None)  Oral - Thin Straw (None)  Oral - Thin Syringe (None)  Oral - Puree (None)  Oral - Mechanical Soft (None)  Oral - Regular (None)  Oral - Multi-consistency (None)  Oral - Pill (None)  Oral Phase - Comment (None)      CHL IP PHARYNGEAL PHASE 12/01/2014  Pharyngeal Phase Impaired  Pharyngeal - Pudding Teaspoon (None)  Penetration/Aspiration details (pudding teaspoon) (None)  Pharyngeal - Pudding Cup (None)  Penetration/Aspiration details (pudding cup) (None)  Pharyngeal - Honey Teaspoon (None)   Penetration/Aspiration details (honey teaspoon) (None)  Pharyngeal - Honey Cup (None)  Penetration/Aspiration details (honey cup) (None)  Pharyngeal - Honey Syringe (None)  Penetration/Aspiration details (honey syringe) (None)  Pharyngeal - Nectar Teaspoon (None)  Penetration/Aspiration details (nectar teaspoon) (None)  Pharyngeal - Nectar Cup (None)  Penetration/Aspiration details (nectar cup) (None)  Pharyngeal - Nectar Straw (None)  Penetration/Aspiration details (nectar straw) (None)  Pharyngeal - Nectar Syringe (None)  Penetration/Aspiration details (nectar syringe) (None)  Pharyngeal - Ice Chips (None)  Penetration/Aspiration details (ice chips) (None)  Pharyngeal - Thin Teaspoon (None)  Penetration/Aspiration details (thin teaspoon) (None)  Pharyngeal - Thin Cup (None)  Penetration/Aspiration details (thin cup) (None)  Pharyngeal - Thin Straw (None)  Penetration/Aspiration details (thin straw) (None)  Pharyngeal - Thin Syringe (None)  Penetration/Aspiration details (thin syringe') (None)  Pharyngeal - Puree (None)  Penetration/Aspiration details (puree) (None)  Pharyngeal - Mechanical Soft (None)  Penetration/Aspiration details (mechanical soft) (None)  Pharyngeal - Regular (None)  Penetration/Aspiration details (regular) (None) Pharyngeal -  Multi-consistency (None)  Penetration/Aspiration details (multi-consistency) (None)  Pharyngeal - Pill (None)  Penetration/Aspiration details (pill) (None)  Pharyngeal Comment (None)      CHL IP CERVICAL ESOPHAGEAL PHASE 12/01/2014  Cervical Esophageal Phase Impaired  Pudding Teaspoon (None)  Pudding Cup (None)  Honey Teaspoon (None)  Honey Cup (None)  Honey Straw (None)  Nectar Teaspoon (None)  Nectar Cup Esophageal backflow into cervical esophagus  Nectar Straw Esophageal backflow into cervical esophagus  Nectar Sippy Cup (None)  Thin Teaspoon (None)  Thin Cup Esophageal backflow into cervical esophagus  Thin Straw (None)  Thin Sippy Cup (None)  Cervical Esophageal  Comment (None)    No flowsheet data found.         Metro Kung, MA, CCC-SLP 12/01/2014, 3:14 PM  Page: [phone]     Microbiology: No results found for this or any previous visit (from the past 240 hour(s)).   Labs: Basic Metabolic Panel:  Recent Labs Lab 11/29/14 1645 11/30/14 0549 12/01/14 0652 12/02/14 0648  NA 142 140 143 142  K 3.5 4.1 4.0 3.6  CL 102 104 103 98*  CO2 32  GLUCOSE 129* 184* 195* 188*  BUN 20 21* 36* 50*  CREATININE 1.38* 1.26* 1.45* 1.37*  CALCIUM 8.9 8.9 8.7* 8.5*  MG 1.6*  --   --   --    Liver Function Tests:  Recent Labs Lab 11/30/14 0549  AST 30  ALT 32  ALKPHOS 90  BILITOT 0.8  PROT 7.5  ALBUMIN 3.3*   No results for input(s): LIPASE, AMYLASE in the last 168 hours. No results for input(s): AMMONIA in the last 168 hours. CBC:  Recent Labs Lab 11/29/14 1645 11/30/14 0549 12/01/14 0652  WBC 11.2* 8.1 10.8*  NEUTROABS 8.0*  --   --   HGB 11.3* 11.6* 11.3*  HCT 37.3 38.2 36.7  MCV 88.0 87.4 87.2  PLT 278 253 317   Cardiac Enzymes: No results for input(s): CKTOTAL, CKMB, CKMBINDEX, TROPONINI in the last 168 hours. BNP: BNP (last 3 results)  Recent Labs  10/18/14 1829  BNP 53.0    ProBNP (last 3 results) No results for input(s): PROBNP in the last 8760 hours.  CBG:  Recent Labs Lab 12/01/14 1114 12/01/14 1647 12/01/14 2138 12/02/14 0755 12/02/14 1159  GLUCAP 260* 199* 226* 174* 200*       Signed:  MEMON,JEHANZEB  [facility] 12/02/2014, 1:31 PM

## 2014-12-02 NOTE — Progress Notes (Signed)
Report given to Inetta Fermoina at the [facility].

## 2014-12-02 NOTE — Progress Notes (Signed)
NURSING PROGRESS NOTE  Lesly RubensteinBarbara A Behring [id number] Discharge Data: 12/02/2014 6:53 PM Attending Provider: No att. providers found YQM:[id number]:[name], Chancy HurterJOHN CABOT, MD   Lesly RubensteinBarbara A Inga to be D/C'd Skilled nursing facility per MD order.    All IV's discontinued and monitored for bleeding.  All belongings returned to patient for patient to take home.  Packet included with patient including prescriptions.  Patient left floor via bed, escorted by NTs.  Last Documented Vital Signs:  Blood pressure 130/64, pulse 83, temperature 97.8 F (36.6 C), temperature source Oral, resp. rate 20, height 5\' 11"  (1.803 m), weight 110.043 kg (242 lb 9.6 oz), SpO2 94 %.  Mertha BaarsHorine, Beuna Bolding D

## 2014-12-03 ENCOUNTER — Emergency Department (HOSPITAL_COMMUNITY): Payer: Commercial Managed Care - HMO

## 2014-12-03 ENCOUNTER — Inpatient Hospital Stay (HOSPITAL_COMMUNITY)
Admission: EM | Admit: 2014-12-03 | Discharge: 2015-01-01 | DRG: 207 | Disposition: E | Payer: Commercial Managed Care - HMO | Attending: Internal Medicine | Admitting: Internal Medicine

## 2014-12-03 ENCOUNTER — Encounter (HOSPITAL_COMMUNITY): Payer: Self-pay | Admitting: *Deleted

## 2014-12-03 DIAGNOSIS — J9602 Acute respiratory failure with hypercapnia: Secondary | ICD-10-CM

## 2014-12-03 DIAGNOSIS — T17890A Other foreign object in other parts of respiratory tract causing asphyxiation, initial encounter: Secondary | ICD-10-CM | POA: Diagnosis present

## 2014-12-03 DIAGNOSIS — T17500A Unspecified foreign body in bronchus causing asphyxiation, initial encounter: Secondary | ICD-10-CM

## 2014-12-03 DIAGNOSIS — I509 Heart failure, unspecified: Secondary | ICD-10-CM | POA: Diagnosis present

## 2014-12-03 DIAGNOSIS — J9621 Acute and chronic respiratory failure with hypoxia: Secondary | ICD-10-CM | POA: Diagnosis present

## 2014-12-03 DIAGNOSIS — G4730 Sleep apnea, unspecified: Secondary | ICD-10-CM | POA: Diagnosis present

## 2014-12-03 DIAGNOSIS — R1310 Dysphagia, unspecified: Secondary | ICD-10-CM | POA: Diagnosis present

## 2014-12-03 DIAGNOSIS — R0602 Shortness of breath: Secondary | ICD-10-CM | POA: Diagnosis not present

## 2014-12-03 DIAGNOSIS — E785 Hyperlipidemia, unspecified: Secondary | ICD-10-CM | POA: Diagnosis present

## 2014-12-03 DIAGNOSIS — J9690 Respiratory failure, unspecified, unspecified whether with hypoxia or hypercapnia: Secondary | ICD-10-CM

## 2014-12-03 DIAGNOSIS — R0603 Acute respiratory distress: Secondary | ICD-10-CM

## 2014-12-03 DIAGNOSIS — E119 Type 2 diabetes mellitus without complications: Secondary | ICD-10-CM

## 2014-12-03 DIAGNOSIS — Z4659 Encounter for fitting and adjustment of other gastrointestinal appliance and device: Secondary | ICD-10-CM

## 2014-12-03 DIAGNOSIS — J690 Pneumonitis due to inhalation of food and vomit: Principal | ICD-10-CM | POA: Diagnosis present

## 2014-12-03 DIAGNOSIS — Z8249 Family history of ischemic heart disease and other diseases of the circulatory system: Secondary | ICD-10-CM

## 2014-12-03 DIAGNOSIS — I4891 Unspecified atrial fibrillation: Secondary | ICD-10-CM | POA: Diagnosis present

## 2014-12-03 DIAGNOSIS — R221 Localized swelling, mass and lump, neck: Secondary | ICD-10-CM | POA: Diagnosis present

## 2014-12-03 DIAGNOSIS — R4702 Dysphasia: Secondary | ICD-10-CM | POA: Diagnosis present

## 2014-12-03 DIAGNOSIS — J9601 Acute respiratory failure with hypoxia: Secondary | ICD-10-CM

## 2014-12-03 DIAGNOSIS — J9809 Other diseases of bronchus, not elsewhere classified: Secondary | ICD-10-CM

## 2014-12-03 DIAGNOSIS — M1990 Unspecified osteoarthritis, unspecified site: Secondary | ICD-10-CM | POA: Diagnosis present

## 2014-12-03 DIAGNOSIS — IMO0002 Reserved for concepts with insufficient information to code with codable children: Secondary | ICD-10-CM

## 2014-12-03 DIAGNOSIS — Z515 Encounter for palliative care: Secondary | ICD-10-CM

## 2014-12-03 DIAGNOSIS — Z87891 Personal history of nicotine dependence: Secondary | ICD-10-CM

## 2014-12-03 DIAGNOSIS — Z9889 Other specified postprocedural states: Secondary | ICD-10-CM

## 2014-12-03 DIAGNOSIS — E1140 Type 2 diabetes mellitus with diabetic neuropathy, unspecified: Secondary | ICD-10-CM | POA: Diagnosis present

## 2014-12-03 DIAGNOSIS — J449 Chronic obstructive pulmonary disease, unspecified: Secondary | ICD-10-CM | POA: Diagnosis present

## 2014-12-03 DIAGNOSIS — Z833 Family history of diabetes mellitus: Secondary | ICD-10-CM

## 2014-12-03 DIAGNOSIS — Z66 Do not resuscitate: Secondary | ICD-10-CM | POA: Diagnosis present

## 2014-12-03 DIAGNOSIS — Z7189 Other specified counseling: Secondary | ICD-10-CM | POA: Insufficient documentation

## 2014-12-03 DIAGNOSIS — E039 Hypothyroidism, unspecified: Secondary | ICD-10-CM | POA: Diagnosis present

## 2014-12-03 DIAGNOSIS — E872 Acidosis: Secondary | ICD-10-CM | POA: Diagnosis present

## 2014-12-03 DIAGNOSIS — I10 Essential (primary) hypertension: Secondary | ICD-10-CM | POA: Diagnosis present

## 2014-12-03 DIAGNOSIS — H3530 Unspecified macular degeneration: Secondary | ICD-10-CM | POA: Diagnosis present

## 2014-12-03 DIAGNOSIS — R229 Localized swelling, mass and lump, unspecified: Secondary | ICD-10-CM

## 2014-12-03 DIAGNOSIS — E079 Disorder of thyroid, unspecified: Secondary | ICD-10-CM

## 2014-12-03 NOTE — ED Notes (Signed)
Patient on cardiac monitor at this time. EKG given to EDP Dr Wilkie AyeHorton

## 2014-12-03 NOTE — ED Notes (Signed)
Pt was c/o feeling sob earlier this evening; [facility] and the staff states pt's O2 sats were in the low 80's, they administered O2 at 2-4L and sats did not come up, first responders arrived and gave pt albuterol treatment with little improvement; EMS arrived and placed pt on NRB and sats increased to upper 90's; pt denies any pain and states she feels less sob

## 2014-12-04 ENCOUNTER — Emergency Department (HOSPITAL_COMMUNITY): Payer: Commercial Managed Care - HMO

## 2014-12-04 ENCOUNTER — Encounter (HOSPITAL_COMMUNITY): Payer: Self-pay | Admitting: *Deleted

## 2014-12-04 ENCOUNTER — Inpatient Hospital Stay (HOSPITAL_COMMUNITY): Payer: Commercial Managed Care - HMO

## 2014-12-04 DIAGNOSIS — M1990 Unspecified osteoarthritis, unspecified site: Secondary | ICD-10-CM | POA: Diagnosis present

## 2014-12-04 DIAGNOSIS — E118 Type 2 diabetes mellitus with unspecified complications: Secondary | ICD-10-CM | POA: Diagnosis not present

## 2014-12-04 DIAGNOSIS — R221 Localized swelling, mass and lump, neck: Secondary | ICD-10-CM | POA: Diagnosis present

## 2014-12-04 DIAGNOSIS — G4730 Sleep apnea, unspecified: Secondary | ICD-10-CM | POA: Diagnosis present

## 2014-12-04 DIAGNOSIS — J9809 Other diseases of bronchus, not elsewhere classified: Secondary | ICD-10-CM | POA: Diagnosis not present

## 2014-12-04 DIAGNOSIS — J9602 Acute respiratory failure with hypercapnia: Secondary | ICD-10-CM | POA: Diagnosis not present

## 2014-12-04 DIAGNOSIS — J9601 Acute respiratory failure with hypoxia: Secondary | ICD-10-CM | POA: Diagnosis not present

## 2014-12-04 DIAGNOSIS — R4702 Dysphasia: Secondary | ICD-10-CM | POA: Diagnosis present

## 2014-12-04 DIAGNOSIS — R0602 Shortness of breath: Secondary | ICD-10-CM | POA: Diagnosis present

## 2014-12-04 DIAGNOSIS — I509 Heart failure, unspecified: Secondary | ICD-10-CM | POA: Diagnosis present

## 2014-12-04 DIAGNOSIS — Z833 Family history of diabetes mellitus: Secondary | ICD-10-CM | POA: Diagnosis not present

## 2014-12-04 DIAGNOSIS — J9621 Acute and chronic respiratory failure with hypoxia: Secondary | ICD-10-CM | POA: Diagnosis present

## 2014-12-04 DIAGNOSIS — J690 Pneumonitis due to inhalation of food and vomit: Secondary | ICD-10-CM | POA: Diagnosis present

## 2014-12-04 DIAGNOSIS — E1140 Type 2 diabetes mellitus with diabetic neuropathy, unspecified: Secondary | ICD-10-CM | POA: Diagnosis present

## 2014-12-04 DIAGNOSIS — I10 Essential (primary) hypertension: Secondary | ICD-10-CM | POA: Diagnosis not present

## 2014-12-04 DIAGNOSIS — E079 Disorder of thyroid, unspecified: Secondary | ICD-10-CM | POA: Diagnosis not present

## 2014-12-04 DIAGNOSIS — Z515 Encounter for palliative care: Secondary | ICD-10-CM | POA: Diagnosis not present

## 2014-12-04 DIAGNOSIS — H3530 Unspecified macular degeneration: Secondary | ICD-10-CM | POA: Diagnosis present

## 2014-12-04 DIAGNOSIS — Z8249 Family history of ischemic heart disease and other diseases of the circulatory system: Secondary | ICD-10-CM | POA: Diagnosis not present

## 2014-12-04 DIAGNOSIS — R1310 Dysphagia, unspecified: Secondary | ICD-10-CM | POA: Diagnosis present

## 2014-12-04 DIAGNOSIS — I4891 Unspecified atrial fibrillation: Secondary | ICD-10-CM | POA: Diagnosis not present

## 2014-12-04 DIAGNOSIS — Z87891 Personal history of nicotine dependence: Secondary | ICD-10-CM | POA: Diagnosis not present

## 2014-12-04 DIAGNOSIS — E039 Hypothyroidism, unspecified: Secondary | ICD-10-CM | POA: Diagnosis present

## 2014-12-04 DIAGNOSIS — J449 Chronic obstructive pulmonary disease, unspecified: Secondary | ICD-10-CM | POA: Diagnosis present

## 2014-12-04 DIAGNOSIS — E785 Hyperlipidemia, unspecified: Secondary | ICD-10-CM | POA: Diagnosis present

## 2014-12-04 DIAGNOSIS — E119 Type 2 diabetes mellitus without complications: Secondary | ICD-10-CM | POA: Diagnosis not present

## 2014-12-04 DIAGNOSIS — E872 Acidosis: Secondary | ICD-10-CM | POA: Diagnosis present

## 2014-12-04 DIAGNOSIS — T17890A Other foreign object in other parts of respiratory tract causing asphyxiation, initial encounter: Secondary | ICD-10-CM | POA: Diagnosis present

## 2014-12-04 DIAGNOSIS — Z7189 Other specified counseling: Secondary | ICD-10-CM | POA: Diagnosis not present

## 2014-12-04 DIAGNOSIS — Z66 Do not resuscitate: Secondary | ICD-10-CM | POA: Diagnosis present

## 2014-12-04 LAB — CBC WITH DIFFERENTIAL/PLATELET
BASOS ABS: 0 10*3/uL (ref 0.0–0.1)
Basophils Relative: 0 % (ref 0–1)
EOS ABS: 0 10*3/uL (ref 0.0–0.7)
EOS PCT: 0 % (ref 0–5)
HCT: 43 % (ref 36.0–46.0)
Hemoglobin: 13 g/dL (ref 12.0–15.0)
LYMPHS ABS: 1 10*3/uL (ref 0.7–4.0)
LYMPHS PCT: 9 % — AB (ref 12–46)
MCH: 26.7 pg (ref 26.0–34.0)
MCHC: 30.2 g/dL (ref 30.0–36.0)
MCV: 88.3 fL (ref 78.0–100.0)
MONOS PCT: 6 % (ref 3–12)
Monocytes Absolute: 0.7 10*3/uL (ref 0.1–1.0)
NEUTROS PCT: 85 % — AB (ref 43–77)
Neutro Abs: 9.3 10*3/uL — ABNORMAL HIGH (ref 1.7–7.7)
Platelets: 281 10*3/uL (ref 150–400)
RBC: 4.87 MIL/uL (ref 3.87–5.11)
RDW: 15 % (ref 11.5–15.5)
WBC: 11 10*3/uL — AB (ref 4.0–10.5)

## 2014-12-04 LAB — BASIC METABOLIC PANEL
Anion gap: 10 (ref 5–15)
BUN: 46 mg/dL — ABNORMAL HIGH (ref 6–20)
CHLORIDE: 98 mmol/L — AB (ref 101–111)
CO2: 34 mmol/L — ABNORMAL HIGH (ref 22–32)
Calcium: 8.4 mg/dL — ABNORMAL LOW (ref 8.9–10.3)
Creatinine, Ser: 1.14 mg/dL — ABNORMAL HIGH (ref 0.44–1.00)
GFR, EST AFRICAN AMERICAN: 56 mL/min — AB (ref >60)
GFR, EST NON AFRICAN AMERICAN: 48 mL/min — AB (ref >60)
Glucose, Bld: 198 mg/dL — ABNORMAL HIGH (ref 65–99)
POTASSIUM: 4.4 mmol/L (ref 3.5–5.1)
Sodium: 142 mmol/L (ref 135–145)

## 2014-12-04 LAB — CBC
HCT: 42.8 % (ref 36.0–46.0)
Hemoglobin: 12.8 g/dL (ref 12.0–15.0)
MCH: 26.4 pg (ref 26.0–34.0)
MCHC: 29.9 g/dL — ABNORMAL LOW (ref 30.0–36.0)
MCV: 88.4 fL (ref 78.0–100.0)
Platelets: 254 10*3/uL (ref 150–400)
RBC: 4.84 MIL/uL (ref 3.87–5.11)
RDW: 15 % (ref 11.5–15.5)
WBC: 9.6 10*3/uL (ref 4.0–10.5)

## 2014-12-04 LAB — CREATININE, SERUM
Creatinine, Ser: 1.06 mg/dL — ABNORMAL HIGH (ref 0.44–1.00)
GFR calc Af Amer: >60 mL/min (ref >60)
GFR, EST NON AFRICAN AMERICAN: 53 mL/min — AB (ref >60)

## 2014-12-04 LAB — BLOOD GAS, ARTERIAL
ACID-BASE EXCESS: 8.9 mmol/L — AB (ref 0.0–2.0)
Bicarbonate: 34.7 mEq/L — ABNORMAL HIGH (ref 20.0–24.0)
Drawn by: 105551
FIO2: 100 %
O2 SAT: 99.1 %
PATIENT TEMPERATURE: 37
PH ART: 7.343 — AB (ref 7.350–7.450)
PO2 ART: 193 mmHg — AB (ref 80.0–100.0)
TCO2: 31.5 mmol/L (ref 0–100)
pCO2 arterial: 65.6 mmHg (ref 35.0–45.0)

## 2014-12-04 LAB — GLUCOSE, CAPILLARY
GLUCOSE-CAPILLARY: 265 mg/dL — AB (ref 65–99)
GLUCOSE-CAPILLARY: 229 mg/dL — AB (ref 65–99)
Glucose-Capillary: 253 mg/dL — ABNORMAL HIGH (ref 65–99)
Glucose-Capillary: 229 mg/dL — ABNORMAL HIGH (ref 65–99)

## 2014-12-04 LAB — MRSA PCR SCREENING: MRSA by PCR: NEGATIVE

## 2014-12-04 LAB — TROPONIN I: Troponin I: <0.03 ng/mL (ref <0.031)

## 2014-12-04 LAB — BRAIN NATRIURETIC PEPTIDE: B NATRIURETIC PEPTIDE 5: 53 pg/mL (ref 0.0–100.0)

## 2014-12-04 MED ORDER — CLINDAMYCIN PHOSPHATE 600 MG/50ML IV SOLN
600.0000 mg | Freq: Once | INTRAVENOUS | Status: DC
  Administered 2014-12-04: 600 mg via INTRAVENOUS
  Filled 2014-12-04: qty 50

## 2014-12-04 MED ORDER — VANCOMYCIN HCL 10 G IV SOLR
1250.0000 mg | Freq: Two times a day (BID) | INTRAVENOUS | Status: DC
  Administered 2014-12-04 – 2014-12-07 (×7): 1250 mg via INTRAVENOUS
  Filled 2014-12-04 (×14): qty 1250

## 2014-12-04 MED ORDER — HYDROCODONE-ACETAMINOPHEN 5-325 MG PO TABS
1.0000 | ORAL_TABLET | ORAL | Status: DC | PRN
  Administered 2014-12-04 – 2014-12-05 (×2): 2 via ORAL
  Filled 2014-12-04 (×2): qty 2

## 2014-12-04 MED ORDER — ALBUTEROL SULFATE (2.5 MG/3ML) 0.083% IN NEBU
2.5000 mg | INHALATION_SOLUTION | RESPIRATORY_TRACT | Status: DC | PRN
  Administered 2014-12-04 – 2014-12-06 (×3): 2.5 mg via RESPIRATORY_TRACT
  Filled 2014-12-04 (×4): qty 3

## 2014-12-04 MED ORDER — PREDNISONE 20 MG PO TABS
40.0000 mg | ORAL_TABLET | Freq: Every day | ORAL | Status: AC
  Administered 2014-12-04 – 2014-12-06 (×3): 40 mg via ORAL
  Filled 2014-12-04 (×3): qty 2

## 2014-12-04 MED ORDER — PRAVASTATIN SODIUM 40 MG PO TABS
80.0000 mg | ORAL_TABLET | Freq: Every day | ORAL | Status: DC
  Administered 2014-12-04 – 2014-12-06 (×3): 80 mg via ORAL
  Filled 2014-12-04 (×3): qty 2

## 2014-12-04 MED ORDER — IPRATROPIUM-ALBUTEROL 0.5-2.5 (3) MG/3ML IN SOLN
3.0000 mL | Freq: Once | RESPIRATORY_TRACT | Status: AC
  Administered 2014-12-04: 3 mL via RESPIRATORY_TRACT
  Filled 2014-12-04: qty 3

## 2014-12-04 MED ORDER — SODIUM CHLORIDE 0.9 % IV SOLN
INTRAVENOUS | Status: DC
  Administered 2014-12-04 – 2014-12-05 (×2): via INTRAVENOUS

## 2014-12-04 MED ORDER — IOHEXOL 350 MG/ML SOLN
100.0000 mL | Freq: Once | INTRAVENOUS | Status: AC | PRN
  Administered 2014-12-04: 100 mL via INTRAVENOUS

## 2014-12-04 MED ORDER — FLUCONAZOLE 100 MG PO TABS
100.0000 mg | ORAL_TABLET | Freq: Every day | ORAL | Status: DC
  Administered 2014-12-04 – 2014-12-06 (×3): 100 mg via ORAL
  Filled 2014-12-04 (×3): qty 1

## 2014-12-04 MED ORDER — POTASSIUM CHLORIDE CRYS ER 20 MEQ PO TBCR
20.0000 meq | EXTENDED_RELEASE_TABLET | Freq: Two times a day (BID) | ORAL | Status: DC
  Administered 2014-12-04 – 2014-12-06 (×5): 20 meq via ORAL
  Filled 2014-12-04 (×5): qty 1

## 2014-12-04 MED ORDER — VANCOMYCIN HCL IN DEXTROSE 1-5 GM/200ML-% IV SOLN
1000.0000 mg | Freq: Once | INTRAVENOUS | Status: AC
  Administered 2014-12-04: 1000 mg via INTRAVENOUS
  Filled 2014-12-04: qty 200

## 2014-12-04 MED ORDER — ASPIRIN EC 81 MG PO TBEC
162.0000 mg | DELAYED_RELEASE_TABLET | Freq: Every day | ORAL | Status: DC
  Administered 2014-12-04 – 2014-12-06 (×3): 162 mg via ORAL
  Filled 2014-12-04 (×3): qty 2

## 2014-12-04 MED ORDER — FOLIC ACID 1 MG PO TABS
1.0000 mg | ORAL_TABLET | Freq: Every day | ORAL | Status: DC
  Administered 2014-12-04 – 2014-12-06 (×3): 1 mg via ORAL
  Filled 2014-12-04 (×3): qty 1

## 2014-12-04 MED ORDER — ENOXAPARIN SODIUM 40 MG/0.4ML ~~LOC~~ SOLN
40.0000 mg | SUBCUTANEOUS | Status: DC
  Administered 2014-12-04 – 2014-12-09 (×6): 40 mg via SUBCUTANEOUS
  Filled 2014-12-04 (×6): qty 0.4

## 2014-12-04 MED ORDER — PIPERACILLIN-TAZOBACTAM 3.375 G IVPB
3.3750 g | Freq: Three times a day (TID) | INTRAVENOUS | Status: DC
  Administered 2014-12-04 – 2014-12-13 (×28): 3.375 g via INTRAVENOUS
  Filled 2014-12-04 (×31): qty 50

## 2014-12-04 MED ORDER — METHYLPREDNISOLONE SODIUM SUCC 125 MG IJ SOLR
125.0000 mg | Freq: Once | INTRAMUSCULAR | Status: AC
  Administered 2014-12-04: 125 mg via INTRAVENOUS
  Filled 2014-12-04: qty 2

## 2014-12-04 MED ORDER — LEVOTHYROXINE SODIUM 112 MCG PO TABS
112.0000 ug | ORAL_TABLET | Freq: Every day | ORAL | Status: DC
  Administered 2014-12-04 – 2014-12-07 (×4): 112 ug via ORAL
  Filled 2014-12-04 (×4): qty 1

## 2014-12-04 MED ORDER — NIACIN ER 500 MG PO CPCR
500.0000 mg | ORAL_CAPSULE | Freq: Every day | ORAL | Status: DC
  Administered 2014-12-04 – 2014-12-05 (×2): 500 mg via ORAL
  Filled 2014-12-04 (×4): qty 1

## 2014-12-04 MED ORDER — ONDANSETRON HCL 4 MG PO TABS: 4.0000 mg | ORAL_TABLET | Freq: Four times a day (QID) | ORAL | Status: DC | PRN

## 2014-12-04 MED ORDER — ONDANSETRON HCL 4 MG/2ML IJ SOLN
4.0000 mg | Freq: Four times a day (QID) | INTRAMUSCULAR | Status: DC | PRN
  Administered 2014-12-10 – 2014-12-12 (×2): 4 mg via INTRAVENOUS
  Filled 2014-12-04 (×2): qty 2

## 2014-12-04 MED ORDER — ALPRAZOLAM 0.5 MG PO TABS
0.5000 mg | ORAL_TABLET | Freq: Once | ORAL | Status: AC
  Administered 2014-12-04: 0.5 mg via ORAL
  Filled 2014-12-04: qty 1

## 2014-12-04 MED ORDER — CARVEDILOL 12.5 MG PO TABS
25.0000 mg | ORAL_TABLET | Freq: Two times a day (BID) | ORAL | Status: DC
  Administered 2014-12-04 – 2014-12-07 (×7): 25 mg via ORAL
  Filled 2014-12-04 (×7): qty 2

## 2014-12-04 MED ORDER — NIACIN 500 MG PO TABS: 500.0000 mg | ORAL_TABLET | Freq: Every day | ORAL | Status: DC

## 2014-12-04 MED ORDER — EZETIMIBE 10 MG PO TABS
10.0000 mg | ORAL_TABLET | Freq: Every day | ORAL | Status: DC
  Administered 2014-12-04 – 2014-12-06 (×3): 10 mg via ORAL
  Filled 2014-12-04 (×3): qty 1

## 2014-12-04 MED ORDER — VANCOMYCIN HCL IN DEXTROSE 1-5 GM/200ML-% IV SOLN
INTRAVENOUS | Status: AC
Start: 2014-12-04 — End: 2014-12-04
  Filled 2014-12-04: qty 200

## 2014-12-04 MED ORDER — PIPERACILLIN-TAZOBACTAM 3.375 G IVPB 30 MIN
3.3750 g | Freq: Once | INTRAVENOUS | Status: AC
  Administered 2014-12-04: 3.375 g via INTRAVENOUS
  Filled 2014-12-04: qty 50

## 2014-12-04 MED ORDER — INSULIN ASPART 100 UNIT/ML ~~LOC~~ SOLN
0.0000 [IU] | Freq: Three times a day (TID) | SUBCUTANEOUS | Status: DC
Start: 2014-12-04 — End: 2014-12-05
  Administered 2014-12-04 (×2): 5 [IU] via SUBCUTANEOUS
  Administered 2014-12-04: 3 [IU] via SUBCUTANEOUS
  Administered 2014-12-05: 5 [IU] via SUBCUTANEOUS
  Administered 2014-12-05: 3 [IU] via SUBCUTANEOUS

## 2014-12-04 MED ORDER — GABAPENTIN 100 MG PO CAPS
100.0000 mg | ORAL_CAPSULE | Freq: Three times a day (TID) | ORAL | Status: DC
  Administered 2014-12-04 – 2014-12-06 (×8): 100 mg via ORAL
  Filled 2014-12-04 (×8): qty 1

## 2014-12-04 NOTE — Clinical Social Work Note (Signed)
Clinical Social Work Assessment  Patient Details  Name: Samantha RubensteinBarbara A [name] MRN: [id number] Date of Birth: 07/03/1945  Date of referral:  12/04/14               Reason for consult:  Facility Placement                Permission sought to share information with:    Permission granted to share information::     Name::        Agency::     Relationship::     Contact Information:     Housing/Transportation Living arrangements for the past 2 months:  Single Family Home Source of Information:  Patient Patient Interpreter Needed:  None Criminal Activity/Legal Involvement Pertinent to Current Situation/Hospitalization:  No - Comment as needed Significant Relationships:  Spouse, Adult Children Lives with:  Spouse Do you feel safe going back to the place where you live?  Yes Need for family participation in patient care:  Yes (Comment)  Care giving concerns:  [facility]   Social Worker assessment / plan:  Patient advised [facility] before having to return to the hospital. She advised that she now has pneumonia.  Patient [facility] discharge from the hospital.    Employment status:  Retired Health and safety inspectornsurance information:  Managed Medicare PT Recommendations:  Not assessed at this time Information / Referral to community resources:     Patient/Family's Response to care: [facility].  Patient/Family's Understanding of and Emotional Response to Diagnosis, Current Treatment, and Prognosis:  Patient has clear understanding of her diagnosis, treatment and prognosis.    Emotional Assessment Appearance:  Developmentally appropriate Attitude/Demeanor/Rapport:   (Cooperative) Affect (typically observed):  Calm Orientation:  Oriented to Self, Oriented to Place, Oriented to  Time, Oriented to Situation Alcohol / Substance use:  Not Applicable Psych  involvement (Current and /or in the community):  No (Comment)  Discharge Needs  Concerns to be addressed:  Discharge Planning Concerns Readmission within the last 30 days:  Yes Current discharge risk:  None Barriers to Discharge:  No Barriers Identified   Annice NeedySettle, Ranessa Kosta D, LCSW 12/04/2014, 2:06 [phone]

## 2014-12-04 NOTE — ED Provider Notes (Signed)
CSN: [id number]     Arrival date & time December 21, 2014  2249 History   First MD Initiated Contact with Patient Dec 21, 2014 2304     Chief Complaint  Patient presents with  . Shortness of Breath     (Consider location/radiation/quality/duration/timing/severity/associated sxs/prior Treatment) HPI  This is a 69 year old female with a history of diabetes, hypertension, hyperlipidemia, COPD, CHF, brain aneurysm, and dysphagia who presents with shortness of breath.  Patient was recently admitted for a COPD exacerbation last week. She was discharged 2 days ago to the [facility]. She had increasing shortness of breath earlier this evening with oxygen saturations in the low 80s that did not respond to nasal cannula. Patient required a nonrebreather.  Albuterol neb in route did not help. Patient has some dysarthria and is somewhat difficult to understand. She denies any chest pain. She feels like she is having "a hard time talking." She does state that the oxygen helps. She has a history of CHF and COPD. No coughs or fevers noted. No increasing leg swelling.  Level V caveat for acuity of condition  Past Medical History  Diagnosis Date  . Brain aneurysm   . Neuropathy   . Diabetes mellitus   . Anemia   . Thyroid disease   . Arthritis   . Macular degeneration   . Chest pain   . Hypertension   . Hyperlipidemia   . Obesity   . Abnormal stress test     -false positive, prob breast attenuation  . Hx of cardiac catheterization 11/2013    normal coronary arteries  . Sleep apnea     doesn't wear CPAP, sleeps in a lift chair  . Difficult intubation 02/08/14    re-intubated in PACU  . COPD (chronic obstructive pulmonary disease)   . CHF (congestive heart failure)   . Brain aneurysm 2004  . Diabetes mellitus without complication   . Renal disorder   . Malnutrition    Past Surgical History  Procedure Laterality Date  . Tonsillectomy    . Lower back    . Heel spurs    . Hammer toes    . Right knee sark     . Cerebral aneurysm repair    . Cardiac catheterization  12/22/13    done for "angina" and abnormal nuc study- normal coronary arteries  . Cardiac catheterization  2004    insignificant CAD  . Cholecystectomy N/A 02/08/2014    Procedure: LAPAROSCOPIC CHOLECYSTECTOMY;  Surgeon: Dalia Heading, MD;  Location: [facility];  Service: General;  Laterality: N/A;  . Esophagogastroduodenoscopy  2007    Dr. Jena Gauss: mottled patchy erythema of stomach, small bowel bx negative  . Left heart catheterization with coronary angiogram N/A 12/22/2013    Procedure: LEFT HEART CATHETERIZATION WITH CORONARY ANGIOGRAM;  Surgeon: Runell Gess, MD;  Location: [facility] CATH LAB;  Service: Cardiovascular;  Laterality: N/A;  . Transthoracic echocardiogram  10/05/2012    normal EF  . Doppler echocardiography  09/27/2010    EF>55%  . Nm myoview ltd  08/26/2011    EF 51% Low risk scan  . Cardiac catheterization  11/16/2002  . Lower ext arterial dopplers   11/11/2012    Abnormal dopplers  . Lower ext arterial dopplers  04/10/2011    abnormal dopplers  . Carotid duplex  04/10/2011    mildly abnormal carotid duplex  . Abdominal aortogram  05/07/2006  . Sleep study  08/10/2011  . Cpap report  09/03/2011  . Cholecystectomy    . Appendectomy    .  Back surgery  1970   Family History  Problem Relation Age of Onset  . Diabetes      family history   . Arthritis      family history   . Diabetes Mother   . Hypertension Mother   . Seizures Sister   . Colon cancer Neg Hx    History  Substance Use Topics  . Smoking status: Former Smoker    Quit date: 06/02/2002  . Smokeless tobacco: Not on file  . Alcohol Use: No   OB History    Gravida Para Term Preterm AB TAB SAB Ectopic Multiple Living   0 0 0 0 0 0 0 0       Review of Systems  Constitutional: Negative for fever and chills.  Respiratory: Positive for shortness of breath. Negative for cough and chest tightness.   Cardiovascular: Negative for chest pain and leg swelling.   Gastrointestinal: Negative for nausea, vomiting and abdominal pain.  Genitourinary: Negative for dysuria.  Neurological: Negative for headaches.  All other systems reviewed and are negative.     Allergies  Clarithromycin; Levofloxacin; Metronidazole; Sulfonamide derivatives; Amoxicillin-pot clavulanate; Amoxicillin-pot clavulanate; Augmentin; Clarithromycin; Clindamycin/lincomycin; Clindamycin/lincomycin; Lansoprazole; Metronidazole; and Sulfamethoxazole-trimethoprim  Home Medications   Prior to Admission medications   Medication Sig Start Date End Date Taking? Authorizing Provider  Alum & Mag Hydroxide-Simeth (MAGIC MOUTHWASH) SOLN Take 10 mLs by mouth 3 (three) times daily. 12/02/14   Erick Blinks, MD  aspirin 81 MG tablet Take 162 mg by mouth daily.     Historical Provider, MD  BREO ELLIPTA 100-25 MCG/INH AEPB Inhale 1 puff into the lungs daily. 10/02/14   Historical Provider, MD  carvedilol (COREG) 25 MG tablet Take 1 tablet (25 mg total) by mouth 2 (two) times daily with a meal. 02/15/14   Runell Gess, MD  Coenzyme Q10 (CO Q 10) 100 MG CAPS Take 100 mg by mouth 2 (two) times daily.     Historical Provider, MD  ferrous sulfate 325 (65 FE) MG tablet Take 325 mg by mouth daily.    Historical Provider, MD  fluconazole (DIFLUCAN) 100 MG tablet Take 1 tablet (100 mg total) by mouth daily. For 7 days 12/02/14   Erick Blinks, MD  folic acid (FOLVITE) 1 MG tablet Take 1 tablet (1 mg total) by mouth daily. 12/20/13   Runell Gess, MD  furosemide (LASIX) 40 MG tablet Take 80 mg by mouth daily. 07/04/13   Runell Gess, MD  gabapentin (NEURONTIN) 100 MG capsule Take 100 mg by mouth 3 (three) times daily.    Historical Provider, MD  HYDROcodone-acetaminophen (HYCET) 7.5-325 mg/15 ml solution Take PO q 8 hours PRN for pain 12/02/14   Erick Blinks, MD  levalbuterol (XOPENEX) 0.63 MG/3ML nebulizer solution Take 3 mLs by nebulization every 6 (six) hours as needed for wheezing or shortness  of breath.  10/18/14   Historical Provider, MD  levothyroxine (SYNTHROID, LEVOTHROID) 112 MCG tablet Take 112 mcg by mouth daily before breakfast.    Historical Provider, MD  lidocaine (XYLOCAINE) 2 % solution Use as directed 10 mLs in the mouth or throat 3 (three) times daily. 12/02/14   Erick Blinks, MD  niacin 500 MG tablet Take 500 mg by mouth at bedtime.     Historical Provider, MD  ondansetron (ZOFRAN ODT) 8 MG disintegrating tablet Take 1 tablet (8 mg total) by mouth every 8 (eight) hours as needed. 8mg  ODT q4 hours prn nausea 11/17/14   Zadie Rhine, MD  potassium chloride SA (K-DUR,KLOR-CON) 20 MEQ tablet Take 20 mEq by mouth 2 (two) times daily. 08/15/14   Historical Provider, MD  pravastatin (PRAVACHOL) 80 MG tablet Take 1 tablet (80 mg total) by mouth daily. 11/25/12   Susa Griffins, MD  predniSONE (DELTASONE) 20 MG tablet Take 40mg  po daily for 3 more days 12/02/14   Erick Blinks, MD  ranitidine (ZANTAC) 150 MG tablet Take 150 mg by mouth at bedtime. 09/11/14   Historical Provider, MD  SPIRIVA HANDIHALER 18 MCG inhalation capsule Take 18 mcg by mouth daily. 10/05/14   Historical Provider, MD  ZETIA 10 MG tablet TAKE ONE TABLET BY MOUTH ONCE DAILY. 09/14/14   Runell Gess, MD   BP 149/70 mmHg  Pulse 73  Temp(Src) 97.9 F (36.6 C) (Oral)  Resp 17  Ht 5\' 11"  (1.803 m)  Wt 242 lb (109.77 kg)  BMI 33.77 kg/m2  SpO2 93% Physical Exam  Constitutional: She is oriented to person, place, and time. No distress.  Chronically ill-appearing, mild tachypnea  HENT:  Head: Normocephalic and atraumatic.  Mouth/Throat: Oropharynx is clear and moist.  Eyes: Pupils are equal, round, and reactive to light.  Cardiovascular: Normal rate, regular rhythm and normal heart sounds.   No murmur heard. Pulmonary/Chest: No respiratory distress.  Increased respiratory effort, generally poor air movement, decreased breath sounds left upper lobe, scant wheeze  Abdominal: Soft. Bowel sounds are normal.  There is no tenderness. There is no rebound.  Musculoskeletal:  Trace bilateral lower extremity edema  Neurological: She is alert and oriented to person, place, and time.  Skin: Skin is warm and dry.  Chronic venous stasis changes lower extremities  Psychiatric: She has a normal mood and affect.  Nursing note and vitals reviewed.   ED Course  Procedures (including critical care time)  CRITICAL CARE Performed by: Shon Baton   Total critical care time: 45 min  Critical care time was exclusive of separately billable procedures and treating other patients.  Critical care was necessary to treat or prevent imminent or life-threatening deterioration.  Critical care was time spent personally by me on the following activities: development of treatment plan with patient and/or surrogate as well as nursing, discussions with consultants, evaluation of patient's response to treatment, examination of patient, obtaining history from patient or surrogate, ordering and performing treatments and interventions, ordering and review of laboratory studies, ordering and review of radiographic studies, pulse oximetry and re-evaluation of patient's condition.  Labs Review Labs Reviewed  CBC WITH DIFFERENTIAL/PLATELET - Abnormal; Notable for the following:    WBC 11.0 (*)    Neutrophils Relative % 85 (*)    Neutro Abs 9.3 (*)    Lymphocytes Relative 9 (*)    All other components within normal limits  BASIC METABOLIC PANEL - Abnormal; Notable for the following:    Chloride 98 (*)    CO2 34 (*)    Glucose, Bld 198 (*)    BUN 46 (*)    Creatinine, Ser 1.14 (*)    Calcium 8.4 (*)    GFR calc non Af Amer 48 (*)    GFR calc Af Amer 56 (*)    All other components within normal limits  BLOOD GAS, ARTERIAL - Abnormal; Notable for the following:    pH, Arterial 7.343 (*)    pCO2 arterial 65.6 (*)    pO2, Arterial 193 (*)    Bicarbonate 34.7 (*)    Acid-Base Excess 8.9 (*)    Allens test  (pass/fail)  NOT INDICATED (*)    All other components within normal limits  CULTURE, BLOOD (ROUTINE X 2)  CULTURE, BLOOD (ROUTINE X 2)  BRAIN NATRIURETIC PEPTIDE  TROPONIN I    Imaging Review Ct Angio Chest Pe W/cm &/or Wo Cm  12/04/2014   CLINICAL DATA:  Acute onset of shortness of breath and decreased O2 saturation. Weakness and chest discomfort. Initial encounter.  EXAM: CT ANGIOGRAPHY CHEST WITH CONTRAST  TECHNIQUE: Multidetector CT imaging of the chest was performed using the standard protocol during bolus administration of intravenous contrast. Multiplanar CT image reconstructions and MIPs were obtained to evaluate the vascular anatomy.  CONTRAST:  OMNIPAQUE IOHEXOL 350 MG/ML SOLN  COMPARISON:  Chest radiograph performed 12/18/2014  FINDINGS: There is no evidence of significant pulmonary embolus. Evaluation for pulmonary embolus is suboptimal in areas of airspace consolidation.  Debris is noted filling the bronchioles to a portion of the left lower lobe, with associated airspace opacification. More mild airspace opacity is noted at the right lung base. Findings are concerning for aspiration pneumonia. There is no evidence of significant pleural effusion or pneumothorax. No masses are identified; no abnormal focal contrast enhancement is seen.  The mediastinum is unremarkable in appearance. No mediastinal lymphadenopathy is seen. No pericardial effusion is identified. The great vessels are grossly unremarkable in appearance. Incidental note is made of a direct origin of the left vertebral artery from the aortic arch. No axillary lymphadenopathy is seen. A somewhat unusual mass with peripheral fat attenuation is noted at the right thyroid lobe, measuring 1.8 cm.  The visualized portions of the liver and spleen are unremarkable. The patient is status post cholecystectomy, with clips noted at the gallbladder fossa. A 3.0 cm mass is noted at the right adrenal gland, compatible with an adrenal adenoma  based on prior studies. Scattered small cysts are suggested at the left kidney.  No acute osseous abnormalities are seen. Anterior bridging osteophytes are seen along the mid to lower thoracic spine.  Review of the MIP images confirms the above findings.  IMPRESSION: 1. No evidence of significant pulmonary embolus. 2. Debris noted filling the bronchioles to a portion of the left lower lobe, with associated airspace opacification. More mild airspace opacity at the right lung base. Findings concerning for aspiration pneumonia. 3. Somewhat unusual mass with peripheral fat attenuation at the right thyroid lobe, measuring 1.8 cm. Consider further evaluation with thyroid ultrasound. If patient is clinically hyperthyroid, consider nuclear medicine thyroid uptake and scan. 4. Relatively stable 3.0 cm right adrenal adenoma noted. 5. Small left renal cysts seen.   Electronically Signed   By: Roanna Raider M.D.   On: 12/04/2014 02:41   Dg Chest Portable 1 View  12/04/2014   CLINICAL DATA:  Acute onset of shortness of breath and decreased O2 saturation. Initial encounter.  EXAM: PORTABLE CHEST - 1 VIEW  COMPARISON:  Chest radiograph performed 11/29/2014  FINDINGS: The lungs are hypoexpanded. There is mild elevation of the left hemidiaphragm. Bibasilar airspace opacities, left greater than right, likely reflect atelectasis, though mild pneumonia could have a similar appearance. No pleural effusion or pneumothorax is seen.  The cardiomediastinal silhouette is mildly enlarged. No acute osseous abnormalities are identified.  IMPRESSION: 1. Lungs hypoexpanded. Mild elevation of the left hemidiaphragm. Bibasilar airspace opacities, left greater than right, likely reflect atelectasis, though mild pneumonia could have a similar appearance. 2. Mild cardiomegaly noted.   Electronically Signed   By: Roanna Raider M.D.   On: 12/04/2014 00:06  EKG Interpretation   Date/Time:  Sunday December 03 2014 22:57:22 EDT Ventricular Rate:   81 PR Interval:  192 QRS Duration: 114 QT Interval:  391 QTC Calculation: 454 R Axis:   -15 Text Interpretation:  Sinus rhythm Ventricular premature complex  Incomplete left bundle branch block Low voltage, precordial leads Baseline  wander in lead(s) I II aVR Confirmed by Zaelynn Fuchs  MD, Makynzi Eastland ([id number]) on  12/04/2014 3:47:17 AM      MDM   Final diagnoses:  Acute respiratory distress  Aspiration pneumonia, unspecified aspiration pneumonia type    Patient was seen today for shortness of breath. Recent admission for COPD. Requiring nonrebreather at this time. Mildly tachypnea. She is satting in the low 90s on a nonrebreather.  The patient is afebrile. Patient given DuoNeb and steroid-dependent given history of COPD. Blood gas notable for hypercarbia and respiratory acidosis. Patient has leukocytosis to 11 which is unchanged from prior. All the lab work is at baseline.  Questionable pneumonia on chest radiograph. Given patient's oxygen requirement and complex history, CT scan was obtained to rule out PE and better characterized any possible pneumonia. CT scans negative for PE. Patient does have findings concerning for aspiration pneumonia. She is certainly at risk given her history of dysphagia. On recheck, patient continues to be on a nonrebreather. I hesitate to place her on BiPAP secondary to known aspiration pneumonias and difficulty to protect her airway. She is full code. Discussed with the patient that she may need BiPAP and/or mechanical ventilation if she does not improve. Blood cultures were added to the patient's workup and she was given vancomycin, Zosyn, and clindamycin to cover for both HCAP aspiration pneumonia.  Patient discussed with Dr. Benson NorwayLamar. Will admit to stepdown unit.    Shon Batonourtney F Susan Bleich, MD 12/04/14 [phone]

## 2014-12-04 NOTE — Care Management Note (Signed)
Case Management Note  Patient Details  Name: Samantha RubensteinBarbara A [name] MRN: [id number] Date of Birth: 03-15-1946  Expected Discharge Date:  12/08/14               Expected Discharge Plan:  Skilled Nursing Facility  In-House Referral:  Clinical Social Work  Discharge planning Services  CM Consult  Post Acute Care Choice:  NA Choice offered to:  NA  DME Arranged:    DME Agency:     HH Arranged:    HH Agency:     Status of Service:  Completed, signed off  Medicare Important Message Given:    Date Medicare IM Given:    Medicare IM give by:    Date Additional Medicare IM Given:    Additional Medicare Important Message give by:     If discussed at Long Length of Stay Meetings, dates discussed:    Additional Comments: [facility] and re-admitted over weekend. [facility] CSW is aware and will arrange for return to facility when appropriate. No CM needs anticipated.  Malcolm Metrohildress, Estle Huguley Demske, RN 12/04/2014, 3:44 PM

## 2014-12-04 NOTE — Progress Notes (Signed)
Tried weaning patient off NRB mask patients saturation dropped to 89 on 4lpm/[place] , Back on NRB mask slow to rise.

## 2014-12-04 NOTE — H&P (Signed)
PCP:   Colette RibasGOLDING, [name], MD   Chief Complaint:  Shortness of breath  HPI: 69 year old female who   has a past medical history of Brain aneurysm; Neuropathy; Diabetes mellitus; Anemia; Thyroid disease; Arthritis; Macular degeneration; Chest pain; Hypertension; Hyperlipidemia; Obesity; Abnormal stress test; cardiac catheterization (11/2013); Sleep apnea; Difficult intubation (02/08/14); COPD (chronic obstructive pulmonary disease); CHF (congestive heart failure); Brain aneurysm (2004); Diabetes mellitus without complication; Renal disorder; and Malnutrition. Patient was just discharged from the hospital on 7-16 and today was brought back to the hospital for worsening shortness of breath. And oxygen saturation low in 80s. Patient required nonrebreather and was sent to ED for further evaluation. Patient denies chest pain no nausea vomiting or diarrhea. Denies fever she has a history of COPD. In the ED chest x-ray was done which showed mild pneumonia, CT chest was done, which showed findings concerning for aspiration pneumonia and also showed 1.8 cm unusual mass in the right thyroid lobe.  Allergies:   Allergies  Allergen Reactions  . Clarithromycin Anaphylaxis  . Levofloxacin Anaphylaxis  . Metronidazole Anaphylaxis  . Sulfonamide Derivatives Anaphylaxis  . Amoxicillin-Pot Clavulanate     Heart palpitations  . Amoxicillin-Pot Clavulanate   . Augmentin [Amoxicillin-Pot Clavulanate] Other (See Comments)    Heart palpatation  . Clarithromycin   . Clindamycin/Lincomycin Diarrhea and Nausea And Vomiting  . Clindamycin/Lincomycin Diarrhea  . Lansoprazole     SOB, heart racing when took Prevpac  . Metronidazole   . Sulfamethoxazole-Trimethoprim Swelling      Past Medical History  Diagnosis Date  . Brain aneurysm   . Neuropathy   . Diabetes mellitus   . Anemia   . Thyroid disease   . Arthritis   . Macular degeneration   . Chest pain   . Hypertension   . Hyperlipidemia   .  Obesity   . Abnormal stress test     -false positive, prob breast attenuation  . Hx of cardiac catheterization 11/2013    normal coronary arteries  . Sleep apnea     doesn't wear CPAP, sleeps in a lift chair  . Difficult intubation 02/08/14    re-intubated in PACU  . COPD (chronic obstructive pulmonary disease)   . CHF (congestive heart failure)   . Brain aneurysm 2004  . Diabetes mellitus without complication   . Renal disorder   . Malnutrition     Past Surgical History  Procedure Laterality Date  . Tonsillectomy    . Lower back    . Heel spurs    . Hammer toes    . Right knee sark    . Cerebral aneurysm repair    . Cardiac catheterization  12/22/13    done for "angina" and abnormal nuc study- normal coronary arteries  . Cardiac catheterization  2004    insignificant CAD  . Cholecystectomy N/A 02/08/2014    Procedure: LAPAROSCOPIC CHOLECYSTECTOMY;  Surgeon: Dalia HeadingMark A Jenkins, MD;  Location: [facility];  Service: General;  Laterality: N/A;  . Esophagogastroduodenoscopy  2007    Dr. Jena Gaussourk: mottled patchy erythema of stomach, small bowel bx negative  . Left heart catheterization with coronary angiogram N/A 12/22/2013    Procedure: LEFT HEART CATHETERIZATION WITH CORONARY ANGIOGRAM;  Surgeon: Runell GessJonathan J Berry, MD;  [facility];  Service: Cardiovascular;  Laterality: N/A;  . Transthoracic echocardiogram  10/05/2012    normal EF  . Doppler echocardiography  09/27/2010    EF>55%  . Nm myoview ltd  08/26/2011    EF 51%  Low risk scan  . Cardiac catheterization  11/16/2002  . Lower ext arterial dopplers   11/11/2012    Abnormal dopplers  . Lower ext arterial dopplers  04/10/2011    abnormal dopplers  . Carotid duplex  04/10/2011    mildly abnormal carotid duplex  . Abdominal aortogram  05/07/2006  . Sleep study  08/10/2011  . Cpap report  09/03/2011  . Cholecystectomy    . Appendectomy    . Back surgery  1970    Prior to Admission medications   Medication Sig Start Date End Date  Taking? Authorizing Provider  Alum & Mag Hydroxide-Simeth (MAGIC MOUTHWASH) SOLN Take 10 mLs by mouth 3 (three) times daily. 12/02/14   Erick Blinks, MD  aspirin 81 MG tablet Take 162 mg by mouth daily.     Historical Provider, MD  BREO ELLIPTA 100-25 MCG/INH AEPB Inhale 1 puff into the lungs daily. 10/02/14   Historical Provider, MD  carvedilol (COREG) 25 MG tablet Take 1 tablet (25 mg total) by mouth 2 (two) times daily with a meal. 02/15/14   Runell Gess, MD  Coenzyme Q10 (CO Q 10) 100 MG CAPS Take 100 mg by mouth 2 (two) times daily.     Historical Provider, MD  ferrous sulfate 325 (65 FE) MG tablet Take 325 mg by mouth daily.    Historical Provider, MD  fluconazole (DIFLUCAN) 100 MG tablet Take 1 tablet (100 mg total) by mouth daily. For 7 days 12/02/14   Erick Blinks, MD  folic acid (FOLVITE) 1 MG tablet Take 1 tablet (1 mg total) by mouth daily. 12/20/13   Runell Gess, MD  furosemide (LASIX) 40 MG tablet Take 80 mg by mouth daily. 07/04/13   Runell Gess, MD  gabapentin (NEURONTIN) 100 MG capsule Take 100 mg by mouth 3 (three) times daily.    Historical Provider, MD  HYDROcodone-acetaminophen (HYCET) 7.5-325 mg/15 ml solution Take PO q 8 hours PRN for pain 12/02/14   Erick Blinks, MD  levalbuterol (XOPENEX) 0.63 MG/3ML nebulizer solution Take 3 mLs by nebulization every 6 (six) hours as needed for wheezing or shortness of breath.  10/18/14   Historical Provider, MD  levothyroxine (SYNTHROID, LEVOTHROID) 112 MCG tablet Take 112 mcg by mouth daily before breakfast.    Historical Provider, MD  lidocaine (XYLOCAINE) 2 % solution Use as directed 10 mLs in the mouth or throat 3 (three) times daily. 12/02/14   Erick Blinks, MD  niacin 500 MG tablet Take 500 mg by mouth at bedtime.     Historical Provider, MD  ondansetron (ZOFRAN ODT) 8 MG disintegrating tablet Take 1 tablet (8 mg total) by mouth every 8 (eight) hours as needed. 8mg  ODT q4 hours prn nausea 11/17/14   Zadie Rhine, MD    potassium chloride SA (K-DUR,KLOR-CON) 20 MEQ tablet Take 20 mEq by mouth 2 (two) times daily. 08/15/14   Historical Provider, MD  pravastatin (PRAVACHOL) 80 MG tablet Take 1 tablet (80 mg total) by mouth daily. 11/25/12   Susa Griffins, MD  predniSONE (DELTASONE) 20 MG tablet Take 40mg  po daily for 3 more days 12/02/14   Erick Blinks, MD  ranitidine (ZANTAC) 150 MG tablet Take 150 mg by mouth at bedtime. 09/11/14   Historical Provider, MD  SPIRIVA HANDIHALER 18 MCG inhalation capsule Take 18 mcg by mouth daily. 10/05/14   Historical Provider, MD  ZETIA 10 MG tablet TAKE ONE TABLET BY MOUTH ONCE DAILY. 09/14/14   Runell Gess, MD  Social History:  reports that she quit smoking about 12 years ago. She does not have any smokeless tobacco history on file. She reports that she does not drink alcohol or use illicit drugs.  Family History  Problem Relation Age of Onset  . Diabetes      family history   . Arthritis      family history   . Diabetes Mother   . Hypertension Mother   . Seizures Sister   . Colon cancer Neg Hx     Filed Weights   12/12/2014 2255  Weight: 109.77 kg (242 lb)      Review of Systems:  As in history of present illness, rest of the review of systems negative   Physical Exam: Blood pressure 143/65, pulse 69, temperature 97.9 F (36.6 C), temperature source Oral, resp. rate 17, height 5\' 11"  (1.803 m), weight 109.77 kg (242 lb), SpO2 94 %. Constitutional:   Patient is a well-developed and well-nourished *female in mild respiratory distress and cooperative with exam. Head: Normocephalic and atraumatic Mouth: Mucus membranes moist Eyes: PERRL, EOMI, conjunctivae normal Neck: Supple, No Thyromegaly Cardiovascular: RRR, S1 normal, S2 normal Pulmonary/Chest: CTAB, no wheezes, rales, or rhonchi Abdominal: Soft. Non-tender, non-distended, bowel sounds are normal, no masses, organomegaly, or guarding present.  Neurological: A&O x3, Strength is normal and  symmetric bilaterally, cranial nerve II-XII are grossly intact, no focal motor deficit, sensory intact to light touch bilaterally.  Extremities : No Cyanosis, Clubbing or Edema  Labs on Admission:  Basic Metabolic Panel:  Recent Labs Lab 11/29/14 1645 11/30/14 0549 12/01/14 0652 12/02/14 0648 12/22/2014 2340 12/04/14 0515  NA 142 140 143 142 142  --   K 3.5 4.1 4.0 3.6 4.4  --   CL 102 104 103 98* 98*  --   CO2 30 28 30  32 34*  --   GLUCOSE 129* 184* 195* 188* 198*  --   BUN 20 21* 36* 50* 46*  --   CREATININE 1.38* 1.26* 1.45* 1.37* 1.14* 1.06*  CALCIUM 8.9 8.9 8.7* 8.5* 8.4*  --   MG 1.6*  --   --   --   --   --    Liver Function Tests:  Recent Labs Lab 11/30/14 0549  AST 30  ALT 32  ALKPHOS 90  BILITOT 0.8  PROT 7.5  ALBUMIN 3.3*   No results for input(s): LIPASE, AMYLASE in the last 168 hours. No results for input(s): AMMONIA in the last 168 hours. CBC:  Recent Labs Lab 11/29/14 1645 11/30/14 0549 12/01/14 0652 12/22/2014 2340  WBC 11.2* 8.1 10.8* 11.0*  NEUTROABS 8.0*  --   --  9.3*  HGB 11.3* 11.6* 11.3* 13.0  HCT 37.3 38.2 36.7 43.0  MCV 88.0 87.4 87.2 88.3  PLT 278 253 317 281   Cardiac Enzymes:  Recent Labs Lab 12/17/2014 2340  TROPONINI <0.03    BNP (last 3 results)  Recent Labs  10/18/14 1829 12/02/2014 2340  BNP 53.0 53.0    ProBNP (last 3 results) No results for input(s): PROBNP in the last 8760 hours.  CBG:  Recent Labs Lab 12/01/14 1647 12/01/14 2138 12/02/14 0755 12/02/14 1159 12/02/14 1652  GLUCAP 199* 226* 174* 200* 117*    Radiological Exams on Admission: Ct Angio Chest Pe W/cm &/or Wo Cm  12/04/2014   CLINICAL DATA:  Acute onset of shortness of breath and decreased O2 saturation. Weakness and chest discomfort. Initial encounter.  EXAM: CT ANGIOGRAPHY CHEST WITH CONTRAST  TECHNIQUE: Multidetector CT  imaging of the chest was performed using the standard protocol during bolus administration of intravenous contrast.  Multiplanar CT image reconstructions and MIPs were obtained to evaluate the vascular anatomy.  CONTRAST:  OMNIPAQUE IOHEXOL 350 MG/ML SOLN  COMPARISON:  Chest radiograph performed 12/27/2014  FINDINGS: There is no evidence of significant pulmonary embolus. Evaluation for pulmonary embolus is suboptimal in areas of airspace consolidation.  Debris is noted filling the bronchioles to a portion of the left lower lobe, with associated airspace opacification. More mild airspace opacity is noted at the right lung base. Findings are concerning for aspiration pneumonia. There is no evidence of significant pleural effusion or pneumothorax. No masses are identified; no abnormal focal contrast enhancement is seen.  The mediastinum is unremarkable in appearance. No mediastinal lymphadenopathy is seen. No pericardial effusion is identified. The great vessels are grossly unremarkable in appearance. Incidental note is made of a direct origin of the left vertebral artery from the aortic arch. No axillary lymphadenopathy is seen. A somewhat unusual mass with peripheral fat attenuation is noted at the right thyroid lobe, measuring 1.8 cm.  The visualized portions of the liver and spleen are unremarkable. The patient is status post cholecystectomy, with clips noted at the gallbladder fossa. A 3.0 cm mass is noted at the right adrenal gland, compatible with an adrenal adenoma based on prior studies. Scattered small cysts are suggested at the left kidney.  No acute osseous abnormalities are seen. Anterior bridging osteophytes are seen along the mid to lower thoracic spine.  Review of the MIP images confirms the above findings.  IMPRESSION: 1. No evidence of significant pulmonary embolus. 2. Debris noted filling the bronchioles to a portion of the left lower lobe, with associated airspace opacification. More mild airspace opacity at the right lung base. Findings concerning for aspiration pneumonia. 3. Somewhat unusual mass with  peripheral fat attenuation at the right thyroid lobe, measuring 1.8 cm. Consider further evaluation with thyroid ultrasound. If patient is clinically hyperthyroid, consider nuclear medicine thyroid uptake and scan. 4. Relatively stable 3.0 cm right adrenal adenoma noted. 5. Small left renal cysts seen.   Electronically Signed   By: Roanna Raider M.D.   On: 12/04/2014 02:41   Dg Chest Portable 1 View  12/04/2014   CLINICAL DATA:  Acute onset of shortness of breath and decreased O2 saturation. Initial encounter.  EXAM: PORTABLE CHEST - 1 VIEW  COMPARISON:  Chest radiograph performed 11/29/2014  FINDINGS: The lungs are hypoexpanded. There is mild elevation of the left hemidiaphragm. Bibasilar airspace opacities, left greater than right, likely reflect atelectasis, though mild pneumonia could have a similar appearance. No pleural effusion or pneumothorax is seen.  The cardiomediastinal silhouette is mildly enlarged. No acute osseous abnormalities are identified.  IMPRESSION: 1. Lungs hypoexpanded. Mild elevation of the left hemidiaphragm. Bibasilar airspace opacities, left greater than right, likely reflect atelectasis, though mild pneumonia could have a similar appearance. 2. Mild cardiomegaly noted.   Electronically Signed   By: Roanna Raider M.D.   On: 12/04/2014 00:06    EKG: Independently reviewed. Sinus rhythm   Assessment/Plan Active Problems:   Essential hypertension   DM type 2 (diabetes mellitus, type 2)   Aspiration pneumonia  Aspiration pneumonia Patient will be admitted with aspiration pneumonia/healthcare associated pneumonia, will continue vancomycin and Zosyn. Follow blood culture results  Diabetes mellitus *Sliding-scale insulin with NovoLog  Thyroid lobe mass 1.8 cm mass noted in the right thyroid lobe, will order thyroid ultrasound.  Code status: Full code  Family discussion: Discussed with patient's husband and daughter at bedside   Time Spent on Admission: 60  minutes  [facility] Pager: [phone] 12/04/2014, 7:09 AM  If 7PM-7AM, please contact night-coverage  [email or web address]  Password [email or web address]

## 2014-12-04 NOTE — Progress Notes (Signed)
Decreased oxygen to 50venti mask

## 2014-12-04 NOTE — Progress Notes (Signed)
Patient seen and examined. Admitted earlier today from home with shortness of breath. Found to have a pneumonia on CT chest that is concerning for aspiration. Please note that she had a prior hospitalization earlier this week in which she was noted to be aspirating and was seen by  speech therapy with recommendations for a dysphagia 3 diet with nectar thick liquids. continue antibiotics for now, is requiring 50% oxygen via Ventimask, will keep in ICU today until her oxygen requirements diminish. We'll continue to follow.   Peggye PittEstela Hernandez, MD [facility] Pager: [phone]'

## 2014-12-04 NOTE — Progress Notes (Signed)
ANTIBIOTIC CONSULT NOTE - INITIAL  Pharmacy Consult for Vancomycin and Zosyn Indication: pneumonia  Allergies  Allergen Reactions  . Clarithromycin Anaphylaxis  . Levofloxacin Anaphylaxis  . Metronidazole Anaphylaxis  . Sulfonamide Derivatives Anaphylaxis  . Amoxicillin-Pot Clavulanate     Heart palpitations  . Augmentin [Amoxicillin-Pot Clavulanate] Other (See Comments)    Heart palpatation  . Clindamycin/Lincomycin Diarrhea  . Lansoprazole     SOB, heart racing when took Prevpac  . Metronidazole   . Sulfamethoxazole-Trimethoprim Swelling   Patient Measurements: Height:  (180.3 cm) Weight: 242 lb (109.77 kg) IBW/kg (Calculated) : 70.8  Vital Signs: Temp: 97.2 F (36.2 C) (07/04 0800) Temp Source: Axillary (07/04 0800) BP: 170/82 mmHg (07/04 0800) Pulse Rate: 31 (07/04 0800) Intake/Output from previous day: 07/03 0701 - 07/04 0700 In: 211.5 [I.V.:11.5; IV Piggyback:200] Out: -  Intake/Output from this shift:    Labs:  Recent Labs  12/02/14 0648 12/18/2014 2340 12/04/14 0515  WBC  --  11.0* 9.6  HGB  --  13.0 12.8  PLT  --  281 254  CREATININE 1.37* 1.14* 1.06*   Estimated Creatinine Clearance: 69.3 mL/min (by C-G formula based on Cr of 1.06). No results for input(s): VANCOTROUGH, VANCOPEAK, VANCORANDOM, GENTTROUGH, GENTPEAK, GENTRANDOM, TOBRATROUGH, TOBRAPEAK, TOBRARND, AMIKACINPEAK, AMIKACINTROU, AMIKACIN in the last 72 hours.   Microbiology: Recent Results (from the past 720 hour(s))  Urine culture     Status: None   Collection Time: 11/17/14  6:35 AM  Result Value Ref Range Status   Specimen Description URINE, CATHETERIZED  Final   Special Requests NONE  Final   Culture   Final    NO GROWTH 2 DAYS Performed at [facility]    Report Status 11/19/2014 FINAL  Final  Blood culture (routine x 2)     Status: None (Preliminary result)   Collection Time: 12/04/14  4:00 AM  Result Value Ref Range Status   Specimen Description RIGHT  ANTECUBITAL  Final   Special Requests BOTTLES DRAWN AEROBIC ONLY 7CC  Final   Culture PENDING  Incomplete   Report Status PENDING  Incomplete  Blood culture (routine x 2)     Status: None (Preliminary result)   Collection Time: 12/04/14  4:16 AM  Result Value Ref Range Status   Specimen Description RIGHT ANTECUBITAL  Final   Special Requests BOTTLES DRAWN AEROBIC ONLY 6CC  Final   Culture PENDING  Incomplete   Report Status PENDING  Incomplete   Medical History: Past Medical History  Diagnosis Date  . Brain aneurysm   . Neuropathy   . Diabetes mellitus   . Anemia   . Thyroid disease   . Arthritis   . Macular degeneration   . Chest pain   . Hypertension   . Hyperlipidemia   . Obesity   . Abnormal stress test     -false positive, prob breast attenuation  . Hx of cardiac catheterization 11/2013    normal coronary arteries  . Sleep apnea     doesn't wear CPAP, sleeps in a lift chair  . Difficult intubation 02/08/14    re-intubated in PACU  . COPD (chronic obstructive pulmonary disease)   . CHF (congestive heart failure)   . Brain aneurysm 2004  . Diabetes mellitus without complication   . Renal disorder   . Malnutrition    Vancomycin and Zosyn 7/4 >>  Assessment: 68yo NH resident with recent admission for COPD.  Pt is a large lady with good renal fxn - SCr improved since  7/2.  Pt now presents with increased SOB and elevated WBC on admission but now improved.  Currently afebrile.  Vancomycin and Zosyn initiated.  Goal of Therapy:  Vancomycin trough level 15-20 mcg/ml  Plan:  Zosyn 3.375gm IV q8h, each dose over 4 hrs Vancomycin 1250mg  IV q12hrs Check trough at steady state Monitor labs, renal fxn, and c/s  Valrie HartHall, Jase Himmelberger A 12/04/2014,9:42 AM

## 2014-12-04 NOTE — Progress Notes (Signed)
ANTIBIOTIC CONSULT NOTE-Preliminary  Pharmacy Consult for Vancomycin and Zosyn Indication: Pneumonia  Allergies  Allergen Reactions  . Clarithromycin Anaphylaxis  . Levofloxacin Anaphylaxis  . Metronidazole Anaphylaxis  . Sulfonamide Derivatives Anaphylaxis  . Amoxicillin-Pot Clavulanate     Heart palpitations  . Amoxicillin-Pot Clavulanate   . Augmentin [Amoxicillin-Pot Clavulanate] Other (See Comments)    Heart palpatation  . Clarithromycin   . Clindamycin/Lincomycin Diarrhea and Nausea And Vomiting  . Clindamycin/Lincomycin Diarrhea  . Lansoprazole     SOB, heart racing when took Prevpac  . Metronidazole   . Sulfamethoxazole-Trimethoprim Swelling    Patient Measurements: Height:  (180.3 cm) Weight: 242 lb (109.77 kg) IBW/kg (Calculated) : 70.8   Vital Signs: Temp: 97.9 F (36.6 C) (07/03 2255) Temp Source: Oral (07/03 2255) BP: 157/71 mmHg (07/04 0500) Pulse Rate: 76 (07/04 0500)  Labs:  Recent Labs  12/01/14 0652 12/02/14 0648 12-29-14 2340  WBC 10.8*  --  11.0*  HGB 11.3*  --  13.0  PLT 317  --  281  CREATININE 1.45* 1.37* 1.14*    Estimated Creatinine Clearance: 64.4 mL/min (by C-G formula based on Cr of 1.14).  No results for input(s): VANCOTROUGH, VANCOPEAK, VANCORANDOM, GENTTROUGH, GENTPEAK, GENTRANDOM, TOBRATROUGH, TOBRAPEAK, TOBRARND, AMIKACINPEAK, AMIKACINTROU, AMIKACIN in the last 72 hours.   Microbiology: Recent Results (from the past 720 hour(s))  Urine culture     Status: None   Collection Time: 11/17/14  6:35 AM  Result Value Ref Range Status   Specimen Description URINE, CATHETERIZED  Final   Special Requests NONE  Final   Culture   Final    NO GROWTH 2 DAYS Performed at [facility]    Report Status 11/19/2014 FINAL  Final  Blood culture (routine x 2)     Status: None (Preliminary result)   Collection Time: 12/04/14  4:00 AM  Result Value Ref Range Status   Specimen Description RIGHT ANTECUBITAL  Final   Special  Requests BOTTLES DRAWN AEROBIC ONLY 7CC  Final   Culture PENDING  Incomplete   Report Status PENDING  Incomplete  Blood culture (routine x 2)     Status: None (Preliminary result)   Collection Time: 12/04/14  4:16 AM  Result Value Ref Range Status   Specimen Description RIGHT ANTECUBITAL  Final   Special Requests BOTTLES DRAWN AEROBIC ONLY 6CC  Final   Culture PENDING  Incomplete   Report Status PENDING  Incomplete    Medical History: Past Medical History  Diagnosis Date  . Brain aneurysm   . Neuropathy   . Diabetes mellitus   . Anemia   . Thyroid disease   . Arthritis   . Macular degeneration   . Chest pain   . Hypertension   . Hyperlipidemia   . Obesity   . Abnormal stress test     -false positive, prob breast attenuation  . Hx of cardiac catheterization 11/2013    normal coronary arteries  . Sleep apnea     doesn't wear CPAP, sleeps in a lift chair  . Difficult intubation 02/08/14    re-intubated in PACU  . COPD (chronic obstructive pulmonary disease)   . CHF (congestive heart failure)   . Brain aneurysm 2004  . Diabetes mellitus without complication   . Renal disorder   . Malnutrition     Medications:  Vancomycin 1 GM IV on admission at 0430 on 12/04/14 Zosyn 3.375 Gm IV on admission at 0430 on 12/04/14 Clindamycin 600 mg IV on admission at [facility] 12/04/14  Assessment: 69 yo nursing home resident, s/p recent hospital admission for COPD exacerbation; s/p discharge 12/02/14. Pt now with increased SOB, elevated WBCs; chest xray shows possible pneumonia. Empiric antibiotics for aspiration/HCAP pneumonia.   Goal of Therapy:  Vancomycin troughs 15-20 mcg/ml Eradicated infection  Plan:  Preliminary review of pertinent patient information completed.  Protocol will be initiated with a one-time dose of Vancomycin 1 Gm IV in addition to the 1 Gm dose ordered on admission, for a total loading dose of 2000 mg.  Jeani HawkingAnnie Penn clinical pharmacist will complete review during morning  rounds to assess patient and finalize treatment regimen.  Arelia SneddonMason, Vic Esco Anne, [place] Springs[place] 12/04/2014,5:26 AM

## 2014-12-05 ENCOUNTER — Other Ambulatory Visit: Payer: Self-pay

## 2014-12-05 DIAGNOSIS — J9601 Acute respiratory failure with hypoxia: Secondary | ICD-10-CM

## 2014-12-05 DIAGNOSIS — E079 Disorder of thyroid, unspecified: Secondary | ICD-10-CM

## 2014-12-05 LAB — GLUCOSE, CAPILLARY
GLUCOSE-CAPILLARY: 202 mg/dL — AB (ref 65–99)
Glucose-Capillary: 201 mg/dL — ABNORMAL HIGH (ref 65–99)
Glucose-Capillary: 289 mg/dL — ABNORMAL HIGH (ref 65–99)
Glucose-Capillary: 279 mg/dL — ABNORMAL HIGH (ref 65–99)

## 2014-12-05 LAB — COMPREHENSIVE METABOLIC PANEL
ALT: 45 U/L (ref 14–54)
ANION GAP: 9 (ref 5–15)
AST: 18 U/L (ref 15–41)
Albumin: 3.4 g/dL — ABNORMAL LOW (ref 3.5–5.0)
Alkaline Phosphatase: 67 U/L (ref 38–126)
BUN: 48 mg/dL — ABNORMAL HIGH (ref 6–20)
CO2: 36 mmol/L — AB (ref 22–32)
Calcium: 8.6 mg/dL — ABNORMAL LOW (ref 8.9–10.3)
Chloride: 100 mmol/L — ABNORMAL LOW (ref 101–111)
Creatinine, Ser: 1.18 mg/dL — ABNORMAL HIGH (ref 0.44–1.00)
GFR calc non Af Amer: 46 mL/min — ABNORMAL LOW (ref >60)
GFR, EST AFRICAN AMERICAN: 54 mL/min — AB (ref >60)
GLUCOSE: 195 mg/dL — AB (ref 65–99)
POTASSIUM: 5 mmol/L (ref 3.5–5.1)
Sodium: 145 mmol/L (ref 135–145)
TOTAL PROTEIN: 7.2 g/dL (ref 6.5–8.1)
Total Bilirubin: 0.8 mg/dL (ref 0.3–1.2)

## 2014-12-05 LAB — CBC
HEMATOCRIT: 43.2 % (ref 36.0–46.0)
Hemoglobin: 12.7 g/dL (ref 12.0–15.0)
MCH: 26.5 pg (ref 26.0–34.0)
MCHC: 29.4 g/dL — ABNORMAL LOW (ref 30.0–36.0)
MCV: 90 fL (ref 78.0–100.0)
Platelets: 312 10*3/uL (ref 150–400)
RBC: 4.8 MIL/uL (ref 3.87–5.11)
RDW: 15.3 % (ref 11.5–15.5)
WBC: 12.6 10*3/uL — ABNORMAL HIGH (ref 4.0–10.5)

## 2014-12-05 LAB — HEMOGLOBIN A1C
Hgb A1c MFr Bld: 6.8 % — ABNORMAL HIGH (ref 4.8–5.6)
Mean Plasma Glucose: 148 mg/dL

## 2014-12-05 MED ORDER — HYDROCODONE-ACETAMINOPHEN 7.5-325 MG/15ML PO SOLN: ORAL | Status: AC

## 2014-12-05 MED ORDER — MILK AND MOLASSES ENEMA
1.0000 | Freq: Once | RECTAL | Status: AC
  Administered 2014-12-05: 250 mL via RECTAL

## 2014-12-05 MED ORDER — INSULIN ASPART 100 UNIT/ML ~~LOC~~ SOLN
0.0000 [IU] | Freq: Every day | SUBCUTANEOUS | Status: DC
  Administered 2014-12-05: 3 [IU] via SUBCUTANEOUS

## 2014-12-05 MED ORDER — INSULIN DETEMIR 100 UNIT/ML ~~LOC~~ SOLN
5.0000 [IU] | Freq: Every day | SUBCUTANEOUS | Status: DC
  Administered 2014-12-05 – 2014-12-07 (×2): 5 [IU] via SUBCUTANEOUS
  Filled 2014-12-05 (×2): qty 0.05

## 2014-12-05 MED ORDER — DOCUSATE SODIUM 100 MG PO CAPS
100.0000 mg | ORAL_CAPSULE | Freq: Two times a day (BID) | ORAL | Status: DC
  Administered 2014-12-06: 100 mg via ORAL
  Filled 2014-12-05: qty 1

## 2014-12-05 MED ORDER — INSULIN ASPART 100 UNIT/ML ~~LOC~~ SOLN
4.0000 [IU] | Freq: Three times a day (TID) | SUBCUTANEOUS | Status: DC
  Administered 2014-12-05 – 2014-12-06 (×4): 4 [IU] via SUBCUTANEOUS

## 2014-12-05 MED ORDER — SENNA 8.6 MG PO TABS
1.0000 | ORAL_TABLET | Freq: Every day | ORAL | Status: DC | PRN
  Administered 2014-12-05: 8.6 mg via ORAL
  Filled 2014-12-05: qty 1

## 2014-12-05 MED ORDER — INSULIN ASPART 100 UNIT/ML ~~LOC~~ SOLN
0.0000 [IU] | Freq: Three times a day (TID) | SUBCUTANEOUS | Status: DC
  Administered 2014-12-05: 5 [IU] via SUBCUTANEOUS
  Administered 2014-12-06 (×3): 3 [IU] via SUBCUTANEOUS

## 2014-12-05 NOTE — Progress Notes (Signed)
[facility] PROGRESS NOTE  Samantha RubensteinBarbara A [name] ZOX:[id number] DOB: [birth year] DOA: 12/09/2014 PCP: Colette RibasGOLDING, [name], MD  Interim Progress Note Patient was admitted on 7/4 2016 from skilled nursing facility with complaints of shortness of breath. She was found to have pneumonia on CT chest that was concerning for aspiration. She had a hospitalization earlier this week, was noted to be aspirating and was seen by speech therapy with recommendations for a dysphagia 3 diet with nectar thick liquids. Plan is to continue broad-spectrum antibiotics at least another 24 hours, then can consider narrowing to cover aspiration pathogens. Oxygen requirements have decreased and today is on nasal cannula with oxygen saturations in the low 90s. We'll monitor in ICU at least 1 more day. Plan to discharge back to SNF once ready.  Assessment/Plan: Aspiration pneumonia -Continue vancomycin/Zosyn especially considering recent hospitalization and SNF status. -Continue consider narrowing antibiotics tomorrow (after which time we'll have received 3 days of broad-spectrum antibiotics); will need to cover aspiration pathogens. -Continue dysphagia 3 diet with nectar thick liquids as recommended by speech therapy. -Blood cultures remain negative to date.  Acute hypoxemic respiratory failure -Secondary to aspiration pneumonia. -Required Ventimask upon admission, have been able to titrate down to nasal cannula today with marginal oxygen saturations. -Continue oxygen as tolerated.  Thyroid mass -Noted incidentally on CT scan. -Thyroid ultrasound ordered shows a solitary 2.9 cm right thyroid mass which needs consensus criteria for biopsy. -This can be attempted in the outpatient setting once her respiratory status is more stable.  Diabetes mellitus -Remains uncontrolled with CBGs above 200. -Change sliding scale to moderate and add meal coverage, start Levemir 5 units and adjust as necessary.  Code Status: Full  code Family Communication: Patient only  Disposition Plan: Keep in ICU one more day, to SNF once medically ready   Consultants:  None   Antibiotics:  Vancomycin day 2  Zosyn day 2   Subjective: Complains of "pain all over", particularly in the back, feels that she has phlegm in her chest that she is not able to expectorate.  Objective: Filed Vitals:   12/05/14 0800 12/05/14 0900 12/05/14 0955 12/05/14 1126  BP: 129/72 112/62    Pulse: 83 75    Temp:   97.6 F (36.4 C) 97.6 F (36.4 C)  TempSrc:   Oral Oral  Resp: 18 20    Height:      Weight:      SpO2: 97% 94%      Intake/Output Summary (Last 24 hours) at 12/05/14 1246 Last data filed at 12/05/14 1211  Gross per 24 hour  Intake 1679.33 ml  Output      0 ml  Net 1679.33 ml   Filed Weights   12/13/2014 2255 12/05/14 0500  Weight: 109.77 kg (242 lb) 105.7 kg (233 lb 0.4 oz)    Exam:   General:  Alert, awake, oriented 3  Cardiovascular: Regular rate and rhythm, no murmurs, rubs or gallops  Respiratory: Coarse bilateral breath sounds, positive rhonchi, no crackles or wheezes  Abdomen: Soft, nontender, nondistended, positive bowel sounds  Extremities: Trace bilateral pitting edema, positive pedal pulses   Neurologic:  Grossly intact and nonfocal  Data Reviewed: Basic Metabolic Panel:  Recent Labs Lab 11/29/14 1645 11/30/14 0549 12/01/14 0652 12/02/14 0648 12/13/2014 2340 12/04/14 0515 12/05/14 0648  NA 142 140 143 142 142  --  145  K 3.5 4.1 4.0 3.6 4.4  --  5.0  CL 102 104 103 98* 98*  --  100*  CO2 30 28 30  32 34*  --  36*  GLUCOSE 129* 184* 195* 188* 198*  --  195*  BUN 20 21* 36* 50* 46*  --  48*  CREATININE 1.38* 1.26* 1.45* 1.37* 1.14* 1.06* 1.18*  CALCIUM 8.9 8.9 8.7* 8.5* 8.4*  --  8.6*  MG 1.6*  --   --   --   --   --   --    Liver Function Tests:  Recent Labs Lab 11/30/14 0549 12/05/14 0648  AST 30 18  ALT 32 45  ALKPHOS 90 67  BILITOT 0.8 0.8  PROT 7.5 7.2  ALBUMIN 3.3*  3.4*   No results for input(s): LIPASE, AMYLASE in the last 168 hours. No results for input(s): AMMONIA in the last 168 hours. CBC:  Recent Labs Lab 11/29/14 1645 11/30/14 0549 12/01/14 0652 28-Dec-2014 2340 12/04/14 0515 12/05/14 0648  WBC 11.2* 8.1 10.8* 11.0* 9.6 12.6*  NEUTROABS 8.0*  --   --  9.3*  --   --   HGB 11.3* 11.6* 11.3* 13.0 12.8 12.7  HCT 37.3 38.2 36.7 43.0 42.8 43.2  MCV 88.0 87.4 87.2 88.3 88.4 90.0  PLT 278 253 317 281 254 312   Cardiac Enzymes:  Recent Labs Lab 2014-12-28 2340  TROPONINI <0.03   BNP (last 3 results)  Recent Labs  10/18/14 1829 12/28/14 2340  BNP 53.0 53.0    ProBNP (last 3 results) No results for input(s): PROBNP in the last 8760 hours.  CBG:  Recent Labs Lab 12/04/14 1143 12/04/14 1637 12/04/14 2114 12/05/14 0720 12/05/14 1118  GLUCAP 253* 229* 229* 201* 289*    Recent Results (from the past 240 hour(s))  Blood culture (routine x 2)     Status: None (Preliminary result)   Collection Time: 12/04/14  4:00 AM  Result Value Ref Range Status   Specimen Description RIGHT ANTECUBITAL  Final   Special Requests BOTTLES DRAWN AEROBIC ONLY 7CC  Final   Culture PENDING  Incomplete   Report Status PENDING  Incomplete  Blood culture (routine x 2)     Status: None (Preliminary result)   Collection Time: 12/04/14  4:16 AM  Result Value Ref Range Status   Specimen Description RIGHT ANTECUBITAL  Final   Special Requests BOTTLES DRAWN AEROBIC ONLY 6CC  Final   Culture PENDING  Incomplete   Report Status PENDING  Incomplete  MRSA PCR Screening     Status: None   Collection Time: 12/04/14  4:24 AM  Result Value Ref Range Status   MRSA by PCR NEGATIVE NEGATIVE Final    Comment:        The GeneXpert MRSA Assay (FDA approved for NASAL specimens only), is one component of a comprehensive MRSA colonization surveillance program. It is not intended to diagnose MRSA infection nor to guide or monitor treatment for MRSA infections.        Studies: Ct Angio Chest Pe W/cm &/or Wo Cm  12/04/2014   CLINICAL DATA:  Acute onset of shortness of breath and decreased O2 saturation. Weakness and chest discomfort. Initial encounter.  EXAM: CT ANGIOGRAPHY CHEST WITH CONTRAST  TECHNIQUE: Multidetector CT imaging of the chest was performed using the standard protocol during bolus administration of intravenous contrast. Multiplanar CT image reconstructions and MIPs were obtained to evaluate the vascular anatomy.  CONTRAST:  OMNIPAQUE IOHEXOL 350 MG/ML SOLN  COMPARISON:  Chest radiograph performed 2014/12/28  FINDINGS: There is no evidence of significant pulmonary embolus. Evaluation for pulmonary embolus is suboptimal in  areas of airspace consolidation.  Debris is noted filling the bronchioles to a portion of the left lower lobe, with associated airspace opacification. More mild airspace opacity is noted at the right lung base. Findings are concerning for aspiration pneumonia. There is no evidence of significant pleural effusion or pneumothorax. No masses are identified; no abnormal focal contrast enhancement is seen.  The mediastinum is unremarkable in appearance. No mediastinal lymphadenopathy is seen. No pericardial effusion is identified. The great vessels are grossly unremarkable in appearance. Incidental note is made of a direct origin of the left vertebral artery from the aortic arch. No axillary lymphadenopathy is seen. A somewhat unusual mass with peripheral fat attenuation is noted at the right thyroid lobe, measuring 1.8 cm.  The visualized portions of the liver and spleen are unremarkable. The patient is status post cholecystectomy, with clips noted at the gallbladder fossa. A 3.0 cm mass is noted at the right adrenal gland, compatible with an adrenal adenoma based on prior studies. Scattered small cysts are suggested at the left kidney.  No acute osseous abnormalities are seen. Anterior bridging osteophytes are seen along the mid to lower  thoracic spine.  Review of the MIP images confirms the above findings.  IMPRESSION: 1. No evidence of significant pulmonary embolus. 2. Debris noted filling the bronchioles to a portion of the left lower lobe, with associated airspace opacification. More mild airspace opacity at the right lung base. Findings concerning for aspiration pneumonia. 3. Somewhat unusual mass with peripheral fat attenuation at the right thyroid lobe, measuring 1.8 cm. Consider further evaluation with thyroid ultrasound. If patient is clinically hyperthyroid, consider nuclear medicine thyroid uptake and scan. 4. Relatively stable 3.0 cm right adrenal adenoma noted. 5. Small left renal cysts seen.   Electronically Signed   By: Roanna Raider M.D.   On: 12/04/2014 02:41   US Soft Tissue Head/neck  12/04/2014   CLINICAL DATA:  Right thyroid lesion on recent CT chest  EXAM: THYROID ULTRASOUND  TECHNIQUE: Ultrasound examination of the thyroid gland and adjacent soft tissues was performed.  COMPARISON:  CT 12/04/2014  FINDINGS: Right thyroid lobe  Measurements: 42 x 20 x 16 mm. Homogeneous background echotexture. Solitary 29 x 15 x 22 mm complex nodule, mid lobe.  Left thyroid lobe  Measurements: 31 x 13 x 7 mm.  No nodules visualized.  Isthmus  Thickness: 2 mm.  No nodules visualized.  Lymphadenopathy  None visualized.  IMPRESSION: 1. Solitary 2.9 cm right thyroid mass. Findings meet consensus criteria for biopsy. Ultrasound-guided fine needle aspiration should be considered, as per the consensus statement: Management of Thyroid Nodules Detected at [place]: Society of Radiologists in Ultrasound Consensus Conference Statement. Radiology 2005; [id number].   Electronically Signed   By: Corlis Leak M.D.   On: 12/04/2014 10:18   Dg Chest Portable 1 View  12/04/2014   CLINICAL DATA:  Acute onset of shortness of breath and decreased O2 saturation. Initial encounter.  EXAM: PORTABLE CHEST - 1 VIEW  COMPARISON:  Chest radiograph performed 11/29/2014   FINDINGS: The lungs are hypoexpanded. There is mild elevation of the left hemidiaphragm. Bibasilar airspace opacities, left greater than right, likely reflect atelectasis, though mild pneumonia could have a similar appearance. No pleural effusion or pneumothorax is seen.  The cardiomediastinal silhouette is mildly enlarged. No acute osseous abnormalities are identified.  IMPRESSION: 1. Lungs hypoexpanded. Mild elevation of the left hemidiaphragm. Bibasilar airspace opacities, left greater than right, likely reflect atelectasis, though mild pneumonia could have a similar appearance. 2.  Mild cardiomegaly noted.   Electronically Signed   By: Roanna Raider M.D.   On: 12/04/2014 00:06    Scheduled Meds: . aspirin EC  162 mg Oral Daily  . carvedilol  25 mg Oral BID WC  . docusate sodium  100 mg Oral BID  . enoxaparin (LOVENOX) injection  40 mg Subcutaneous Q24H  . ezetimibe  10 mg Oral Daily  . fluconazole  100 mg Oral Daily  . folic acid  1 mg Oral Daily  . gabapentin  100 mg Oral TID  . insulin aspart  0-9 Units Subcutaneous TID WC  . levothyroxine  112 mcg Oral QAC breakfast  . niacin  500 mg Oral QHS  . piperacillin-tazobactam (ZOSYN)  IV  3.375 g Intravenous Q8H  . potassium chloride SA  20 mEq Oral BID  . pravastatin  80 mg Oral Daily  . predniSONE  40 mg Oral Q breakfast  . vancomycin  1,250 mg Intravenous Q12H   Continuous Infusions: . sodium chloride 10 mL/hr at 12/05/14 0981    Active Problems:   Essential hypertension   DM type 2 (diabetes mellitus, type 2)   Aspiration pneumonia    Time spent: 30 minutes. Greater than 50% of this time was spent in direct contact with the patient coordinating care.    Chaya Jan  [facility] Pager [phone]  If 7PM-7AM, please contact night-coverage at [email or web address], password [facility] 12/05/2014, 12:46 PM  LOS: 1 day

## 2014-12-05 NOTE — Telephone Encounter (Signed)
RX faxed to [facility] @ [phone]. Phone number [phone]  

## 2014-12-06 ENCOUNTER — Inpatient Hospital Stay (HOSPITAL_COMMUNITY): Payer: Commercial Managed Care - HMO

## 2014-12-06 DIAGNOSIS — E119 Type 2 diabetes mellitus without complications: Secondary | ICD-10-CM

## 2014-12-06 LAB — COMPREHENSIVE METABOLIC PANEL
ALT: 47 U/L (ref 14–54)
AST: 32 U/L (ref 15–41)
Albumin: 3 g/dL — ABNORMAL LOW (ref 3.5–5.0)
Alkaline Phosphatase: 56 U/L (ref 38–126)
Anion gap: 7 (ref 5–15)
BILIRUBIN TOTAL: 0.7 mg/dL (ref 0.3–1.2)
BUN: 43 mg/dL — ABNORMAL HIGH (ref 6–20)
CHLORIDE: 105 mmol/L (ref 101–111)
CO2: 30 mmol/L (ref 22–32)
CREATININE: 1.14 mg/dL — AB (ref 0.44–1.00)
Calcium: 7.9 mg/dL — ABNORMAL LOW (ref 8.9–10.3)
GFR calc Af Amer: 56 mL/min — ABNORMAL LOW (ref >60)
GFR, EST NON AFRICAN AMERICAN: 48 mL/min — AB (ref >60)
Glucose, Bld: 234 mg/dL — ABNORMAL HIGH (ref 65–99)
Potassium: 4.9 mmol/L (ref 3.5–5.1)
SODIUM: 142 mmol/L (ref 135–145)
Total Protein: 6.3 g/dL — ABNORMAL LOW (ref 6.5–8.1)

## 2014-12-06 LAB — CBC WITH DIFFERENTIAL/PLATELET
Basophils Absolute: 0 10*3/uL (ref 0.0–0.1)
Basophils Relative: 0 % (ref 0–1)
Eosinophils Absolute: 0 10*3/uL (ref 0.0–0.7)
Eosinophils Relative: 0 % (ref 0–5)
HCT: 40.8 % (ref 36.0–46.0)
HEMOGLOBIN: 12.1 g/dL (ref 12.0–15.0)
LYMPHS ABS: 0.8 10*3/uL (ref 0.7–4.0)
LYMPHS PCT: 6 % — AB (ref 12–46)
MCH: 26.8 pg (ref 26.0–34.0)
MCHC: 29.7 g/dL — ABNORMAL LOW (ref 30.0–36.0)
MCV: 90.3 fL (ref 78.0–100.0)
MONOS PCT: 3 % (ref 3–12)
Monocytes Absolute: 0.4 10*3/uL (ref 0.1–1.0)
NEUTROS ABS: 11.2 10*3/uL — AB (ref 1.7–7.7)
Neutrophils Relative %: 91 % — ABNORMAL HIGH (ref 43–77)
Platelets: 231 10*3/uL (ref 150–400)
RBC: 4.52 MIL/uL (ref 3.87–5.11)
RDW: 15.2 % (ref 11.5–15.5)
WBC: 12.4 10*3/uL — AB (ref 4.0–10.5)

## 2014-12-06 LAB — GLUCOSE, CAPILLARY
GLUCOSE-CAPILLARY: 199 mg/dL — AB (ref 65–99)
Glucose-Capillary: 156 mg/dL — ABNORMAL HIGH (ref 65–99)
Glucose-Capillary: 162 mg/dL — ABNORMAL HIGH (ref 65–99)
Glucose-Capillary: 179 mg/dL — ABNORMAL HIGH (ref 65–99)

## 2014-12-06 LAB — LACTIC ACID, PLASMA: Lactic Acid, Venous: 2 mmol/L (ref 0.5–2.0)

## 2014-12-06 LAB — BLOOD GAS, ARTERIAL
Acid-Base Excess: 7.5 mmol/L — ABNORMAL HIGH (ref 0.0–2.0)
Bicarbonate: 32.8 mEq/L — ABNORMAL HIGH (ref 20.0–24.0)
DRAWN BY: 38235
FIO2: 0.5 %
LHR: 12 {breaths}/min
O2 Saturation: 87.1 %
PATIENT TEMPERATURE: 37
PCO2 ART: 58.8 mmHg — AB (ref 35.0–45.0)
PEEP/CPAP: 6 cmH2O
PH ART: 7.365 (ref 7.350–7.450)
TCO2: 30 mmol/L (ref 0–100)
VT: 500 mL
pO2, Arterial: 53.2 mmHg — ABNORMAL LOW (ref 80.0–100.0)

## 2014-12-06 LAB — BASIC METABOLIC PANEL
Anion gap: 8 (ref 5–15)
BUN: 49 mg/dL — ABNORMAL HIGH (ref 6–20)
CO2: 33 mmol/L — ABNORMAL HIGH (ref 22–32)
Calcium: 8.2 mg/dL — ABNORMAL LOW (ref 8.9–10.3)
Chloride: 103 mmol/L (ref 101–111)
Creatinine, Ser: 1.24 mg/dL — ABNORMAL HIGH (ref 0.44–1.00)
GFR calc Af Amer: 51 mL/min — ABNORMAL LOW (ref >60)
GFR calc non Af Amer: 44 mL/min — ABNORMAL LOW (ref >60)
Glucose, Bld: 159 mg/dL — ABNORMAL HIGH (ref 65–99)
Potassium: 4.4 mmol/L (ref 3.5–5.1)
Sodium: 144 mmol/L (ref 135–145)

## 2014-12-06 LAB — CBC
HCT: 41.8 % (ref 36.0–46.0)
HEMOGLOBIN: 12.1 g/dL (ref 12.0–15.0)
MCH: 26.4 pg (ref 26.0–34.0)
MCHC: 28.9 g/dL — ABNORMAL LOW (ref 30.0–36.0)
MCV: 91.3 fL (ref 78.0–100.0)
Platelets: 269 10*3/uL (ref 150–400)
RBC: 4.58 MIL/uL (ref 3.87–5.11)
RDW: 15.3 % (ref 11.5–15.5)
WBC: 14.9 10*3/uL — ABNORMAL HIGH (ref 4.0–10.5)

## 2014-12-06 MED ORDER — LORAZEPAM 0.5 MG PO TABS
0.5000 mg | ORAL_TABLET | Freq: Four times a day (QID) | ORAL | Status: DC | PRN
  Administered 2014-12-06: 0.5 mg via ORAL
  Filled 2014-12-06: qty 1

## 2014-12-06 MED ORDER — MIDAZOLAM HCL 2 MG/2ML IJ SOLN
1.0000 mg | INTRAMUSCULAR | Status: DC | PRN
  Filled 2014-12-06: qty 2

## 2014-12-06 MED ORDER — CHLORHEXIDINE GLUCONATE 0.12 % MT SOLN
15.0000 mL | Freq: Two times a day (BID) | OROMUCOSAL | Status: DC
  Administered 2014-12-07 – 2014-12-13 (×13): 15 mL via OROMUCOSAL
  Filled 2014-12-06 (×13): qty 15

## 2014-12-06 MED ORDER — INSULIN ASPART 100 UNIT/ML ~~LOC~~ SOLN
0.0000 [IU] | SUBCUTANEOUS | Status: DC
  Administered 2014-12-07 – 2014-12-08 (×4): 3 [IU] via SUBCUTANEOUS
  Administered 2014-12-08: 2 [IU] via SUBCUTANEOUS
  Administered 2014-12-08 (×5): 3 [IU] via SUBCUTANEOUS
  Administered 2014-12-09 (×3): 2 [IU] via SUBCUTANEOUS
  Administered 2014-12-09: 3 [IU] via SUBCUTANEOUS
  Administered 2014-12-09: 2 [IU] via SUBCUTANEOUS
  Administered 2014-12-10 (×3): 3 [IU] via SUBCUTANEOUS
  Administered 2014-12-10 – 2014-12-11 (×5): 5 [IU] via SUBCUTANEOUS
  Administered 2014-12-11: 3 [IU] via SUBCUTANEOUS
  Administered 2014-12-11: 5 [IU] via SUBCUTANEOUS
  Administered 2014-12-11 – 2014-12-12 (×3): 3 [IU] via SUBCUTANEOUS
  Administered 2014-12-12: 5 [IU] via SUBCUTANEOUS
  Administered 2014-12-12: 3 [IU] via SUBCUTANEOUS

## 2014-12-06 MED ORDER — FAMOTIDINE IN NACL 20-0.9 MG/50ML-% IV SOLN
20.0000 mg | Freq: Two times a day (BID) | INTRAVENOUS | Status: DC
  Administered 2014-12-07 – 2014-12-14 (×15): 20 mg via INTRAVENOUS
  Filled 2014-12-06 (×18): qty 50

## 2014-12-06 MED ORDER — FENTANYL CITRATE (PF) 100 MCG/2ML IJ SOLN
50.0000 ug | Freq: Once | INTRAMUSCULAR | Status: AC
  Administered 2014-12-06: 50 ug via INTRAVENOUS

## 2014-12-06 MED ORDER — ALBUTEROL SULFATE (2.5 MG/3ML) 0.083% IN NEBU
2.5000 mg | INHALATION_SOLUTION | Freq: Two times a day (BID) | RESPIRATORY_TRACT | Status: DC
  Administered 2014-12-06 – 2014-12-07 (×2): 2.5 mg via RESPIRATORY_TRACT
  Filled 2014-12-06 (×2): qty 3

## 2014-12-06 MED ORDER — FENTANYL BOLUS VIA INFUSION
25.0000 ug | INTRAVENOUS | Status: DC | PRN
  Filled 2014-12-06: qty 25

## 2014-12-06 MED ORDER — FENTANYL CITRATE (PF) 2500 MCG/50ML IJ SOLN
INTRAMUSCULAR | Status: AC
  Filled 2014-12-06: qty 50

## 2014-12-06 MED ORDER — SODIUM CHLORIDE 0.9 % IV SOLN
25.0000 ug/h | INTRAVENOUS | Status: DC
  Administered 2014-12-06: 25 ug/h via INTRAVENOUS
  Administered 2014-12-07 – 2014-12-09 (×2): 100 ug/h via INTRAVENOUS
  Administered 2014-12-10: 75 ug/h via INTRAVENOUS
  Administered 2014-12-10: 100 ug/h via INTRAVENOUS
  Filled 2014-12-06: qty 50

## 2014-12-06 MED ORDER — MIDAZOLAM HCL 2 MG/2ML IJ SOLN
1.0000 mg | INTRAMUSCULAR | Status: DC | PRN
  Administered 2014-12-07: 1 mg via INTRAVENOUS
  Filled 2014-12-06 (×2): qty 2

## 2014-12-06 MED ORDER — CETYLPYRIDINIUM CHLORIDE 0.05 % MT LIQD
7.0000 mL | Freq: Four times a day (QID) | OROMUCOSAL | Status: DC
  Administered 2014-12-07 – 2014-12-13 (×26): 7 mL via OROMUCOSAL

## 2014-12-06 NOTE — Progress Notes (Signed)
PROGRESS NOTE  Samantha [name] ZOX:[id number] DOB: [birth year] DOA: 12/13/2014 PCP: Colette Ribas, MD  Summary: 69 year old woman with history of COPD, dysphagia presented with shortness of breath and hypoxic respiratory failure requiring high flow oxygen. CT chest revealed aspiration pneumonia.  Assessment/Plan: 1. Acute hypoxic, hypercapnic respiratory failure with respiratory acidosis secondary to aspiration pneumonia. Improving with treatment of pneumonia. 2. Aspiration pneumonia. CT negative for PE. Improving. Stable on [place]. 3. COPD, stable. 4. Chronic dysphagia. Last admission recommendations per speech therapy: Dysphagia 3 diet with nectar thick liquids, no straws. 5. Hypothyroidism. Solitary 2.9 cm right thyroid mass found on ultrasound. Suggest outpatient evaluation. 6. DM stable.    Improving, stable on [place].  Plan transfer to medical bed. Continue IV abx today, possibly change to oral in AM.  Check TSH. Consider outpatient FNA. Discussed with patient.  [facility] return to [facility] next 48 hours  Code Status: full code DVT prophylaxis: Lovenox Family Communication: none present. Patient alert and understands plan. Disposition Plan: likely return to [facility]  Brendia Sacks, MD  [facility]  Pager [phone] If 7PM-7AM, please contact night-coverage at [email or web address], password [facility] 12/06/2014, 8:23 AM  LOS: 2 days   Consultants:    Procedures:    Antibiotics:  Vancomycin 7/4 >>  Zosyn 7/4 >>  HPI/Subjective: Overall better, breathing ok. Difficulty expectorating sputum. Swallowing ok.  Objective: Filed Vitals:   12/06/14 0600 12/06/14 0700 12/06/14 0800 12/06/14 0818  BP: 109/61 133/60 124/58   Pulse: 68 70 68   Temp:    97.4 F (36.3 C)  TempSrc:    Axillary  Resp: Height:      Weight:      SpO2: 96% 98% 98%     Intake/Output Summary (Last 24 hours) at 12/06/14 0823 Last data filed at 12/06/14 0800  Gross per 24 hour    Intake   1680 ml  Output      0 ml  Net   1680 ml     Filed Weights   12/22/2014 2255 12/05/14 0500  Weight: 109.77 kg (242 lb) 105.7 kg (233 lb 0.4 oz)    Exam:     Afebrile, vital signs are stable as by mouth to 90% on nasal cannula General:  Appears calm and comfortable sitting up in bed Cardiovascular: RRR, no m/r/g. No LE edema. Telemetry: SR, no arrhythmias  Respiratory: CTA bilaterally, no w/r/r. Normal respiratory effort. Musculoskeletal: grossly normal tone BUE/LLE. RLE hemplegia noted (chronic) Psychiatric: grossly normal mood and affect, speech fluent and appropriate  New data reviewed:  Basic metabolic panel unremarkable  WBC slightly higher, 14.9. Remainder CBC unremarkable.  Blood glucose stable  Pertinent data since admission:  ABG 7.34/65/193  Hemoglobin A1c 6.8  CT angiogram of the chest 7/4: No PE. L >>eft lower lobe airspace opacification concerning for aspiration. Right thyroid lobe mass seen.  Pending data:  Blood cultures  Scheduled Meds: . aspirin EC  162 mg Oral Daily  . carvedilol  25 mg Oral BID WC  . docusate sodium  100 mg Oral BID  . enoxaparin (LOVENOX) injection  40 mg Subcutaneous Q24H  . ezetimibe  10 mg Oral Daily  . fluconazole  100 mg Oral Daily  . folic acid  1 mg Oral Daily  . gabapentin  100 mg Oral TID  . insulin aspart  0-15 Units Subcutaneous TID WC  . insulin aspart  0-5 Units Subcutaneous QHS  . insulin aspart  4 Units Subcutaneous TID WC  .  insulin detemir  5 Units Subcutaneous QHS  . levothyroxine  112 mcg Oral QAC breakfast  . niacin  500 mg Oral QHS  . piperacillin-tazobactam (ZOSYN)  IV  3.375 g Intravenous Q8H  . potassium chloride SA  20 mEq Oral BID  . pravastatin  80 mg Oral Daily  . predniSONE  40 mg Oral Q breakfast  . vancomycin  1,250 mg Intravenous Q12H   Continuous Infusions: . sodium chloride 10 mL/hr at 12/06/14 0600    Principal Problem:   Acute respiratory failure with hypoxia Active  Problems:   Essential hypertension   DM type 2 (diabetes mellitus, type 2)   Aspiration pneumonia   Thyroid mass   Time spent 20 minutes

## 2014-12-06 NOTE — Consult Note (Addendum)
Called to ICU for respiratory distress and hypoxia. Patient admitted with aspiration pneumonia she was found to be hypoxic and cyanotic. Being bagged by respiratory therapy on arrival. She is minimally responsive. Her O2 saturations are in the mid 80s and improving with bagging. Lungs are clear with scattered rhonchi. Patient is intubated as below.  INTUBATION Performed by: Glynn OctaveANCOUR, Emersyn Wyss  Required items: required blood products, implants, devices, and special equipment available Patient identity confirmed: provided demographic data and hospital-assigned identification number Time out: Immediately prior to procedure a "time out" was called to verify the correct patient, procedure, equipment, support staff and site/side marked as required.  Indications: respiratory failure  Intubation method: Glidescope Laryngoscopy   Preoxygenation: BVM  Sedatives: 30 mgEtomidate Paralytic: 120 mgSuccinylcholine  Tube Size: 7.5 cuffed  Post-procedure assessment: chest rise and ETCO2 monitor Breath sounds: equal and absent over the epigastrium Tube secured with: ETT holder Chest x-ray interpreted by radiologist and me.  Chest x-ray findings: endotracheal tube in appropriate position  Patient tolerated the procedure well with no immediate complications.  Patient tolerated procedure well. Placed on ventilator. Seen with Dr. Onalee Huaavid at bedside.  Dr. Onalee Huaavid to assume care after intubation.

## 2014-12-06 NOTE — Progress Notes (Signed)
Patient OOB using sky lift.  Tolerated well.

## 2014-12-06 NOTE — Progress Notes (Signed)
eLink Physician-Brief Progress Note Patient Name: Lesly RubensteinBarbara A Basques DOB: 1945/10/14 MRN: [id number]   Date of Service  12/06/2014  HPI/Events of Note  Patient noted to bradydown to the 40s, and desaturated to the 60s, recovered quickly with HR to 50s, sats recoverd with bagging to upper 90s, anesthesia called, emergent intubation  eICU Interventions  Continue with antibiotics, patient admitted for aspiration pneumonia Cxr,mv, labs     Intervention Category Major Interventions: Respiratory failure - evaluation and management  Zollie Clemence 12/06/2014, 7:16 PM

## 2014-12-07 ENCOUNTER — Ambulatory Visit: Payer: Commercial Managed Care - HMO | Admitting: Neurology

## 2014-12-07 LAB — BLOOD GAS, ARTERIAL
ACID-BASE EXCESS: 8.4 mmol/L — AB (ref 0.0–2.0)
Bicarbonate: 31.5 mEq/L — ABNORMAL HIGH (ref 20.0–24.0)
Drawn by: 38235
FIO2: 0.55 %
LHR: 18 {breaths}/min
MECHVT: 500 mL
O2 Saturation: 91.5 %
PEEP: 6 cmH2O
Patient temperature: 37
TCO2: 27.8 mmol/L (ref 0–100)
pCO2 arterial: 36.5 mmHg (ref 35.0–45.0)
pH, Arterial: 7.545 — ABNORMAL HIGH (ref 7.350–7.450)
pO2, Arterial: 53.1 mmHg — ABNORMAL LOW (ref 80.0–100.0)

## 2014-12-07 LAB — GLUCOSE, CAPILLARY
GLUCOSE-CAPILLARY: 131 mg/dL — AB (ref 65–99)
GLUCOSE-CAPILLARY: 187 mg/dL — AB (ref 65–99)
GLUCOSE-CAPILLARY: 189 mg/dL — AB (ref 65–99)
Glucose-Capillary: 100 mg/dL — ABNORMAL HIGH (ref 65–99)
Glucose-Capillary: 104 mg/dL — ABNORMAL HIGH (ref 65–99)
Glucose-Capillary: 155 mg/dL — ABNORMAL HIGH (ref 65–99)

## 2014-12-07 LAB — CBC WITH DIFFERENTIAL/PLATELET
Basophils Absolute: 0 10*3/uL (ref 0.0–0.1)
Basophils Relative: 0 % (ref 0–1)
Eosinophils Absolute: 0 10*3/uL (ref 0.0–0.7)
Eosinophils Relative: 0 % (ref 0–5)
HEMATOCRIT: 38.3 % (ref 36.0–46.0)
HEMOGLOBIN: 11.5 g/dL — AB (ref 12.0–15.0)
Lymphocytes Relative: 20 % (ref 12–46)
Lymphs Abs: 2.8 10*3/uL (ref 0.7–4.0)
MCH: 26.6 pg (ref 26.0–34.0)
MCHC: 30 g/dL (ref 30.0–36.0)
MCV: 88.5 fL (ref 78.0–100.0)
MONOS PCT: 8 % (ref 3–12)
Monocytes Absolute: 1.1 10*3/uL — ABNORMAL HIGH (ref 0.1–1.0)
NEUTROS ABS: 10.2 10*3/uL — AB (ref 1.7–7.7)
Neutrophils Relative %: 72 % (ref 43–77)
Platelets: 239 10*3/uL (ref 150–400)
RBC: 4.33 MIL/uL (ref 3.87–5.11)
RDW: 15.1 % (ref 11.5–15.5)
WBC: 14.1 10*3/uL — ABNORMAL HIGH (ref 4.0–10.5)

## 2014-12-07 LAB — BASIC METABOLIC PANEL
Anion gap: 8 (ref 5–15)
BUN: 41 mg/dL — ABNORMAL HIGH (ref 6–20)
CO2: 30 mmol/L (ref 22–32)
CREATININE: 1.14 mg/dL — AB (ref 0.44–1.00)
Calcium: 8.2 mg/dL — ABNORMAL LOW (ref 8.9–10.3)
Chloride: 107 mmol/L (ref 101–111)
GFR calc Af Amer: 56 mL/min — ABNORMAL LOW (ref >60)
GFR calc non Af Amer: 48 mL/min — ABNORMAL LOW (ref >60)
Glucose, Bld: 112 mg/dL — ABNORMAL HIGH (ref 65–99)
Potassium: 4.2 mmol/L (ref 3.5–5.1)
Sodium: 145 mmol/L (ref 135–145)

## 2014-12-07 MED ORDER — CARVEDILOL 12.5 MG PO TABS
25.0000 mg | ORAL_TABLET | Freq: Two times a day (BID) | ORAL | Status: DC
  Administered 2014-12-07 – 2014-12-10 (×7): 25 mg
  Filled 2014-12-07 (×7): qty 2

## 2014-12-07 MED ORDER — POTASSIUM CHLORIDE 20 MEQ PO PACK
20.0000 meq | PACK | Freq: Two times a day (BID) | ORAL | Status: DC
  Administered 2014-12-07 – 2014-12-10 (×8): 20 meq
  Filled 2014-12-07 (×13): qty 1

## 2014-12-07 MED ORDER — FAMOTIDINE IN NACL 20-0.9 MG/50ML-% IV SOLN
INTRAVENOUS | Status: AC
  Filled 2014-12-07: qty 50

## 2014-12-07 MED ORDER — IPRATROPIUM-ALBUTEROL 0.5-2.5 (3) MG/3ML IN SOLN
3.0000 mL | RESPIRATORY_TRACT | Status: DC
  Administered 2014-12-07 – 2014-12-13 (×35): 3 mL via RESPIRATORY_TRACT
  Filled 2014-12-07 (×37): qty 3

## 2014-12-07 MED ORDER — FENTANYL CITRATE (PF) 2500 MCG/50ML IJ SOLN
INTRAMUSCULAR | Status: AC
  Filled 2014-12-07: qty 50

## 2014-12-07 MED ORDER — LEVOTHYROXINE SODIUM 112 MCG PO TABS
112.0000 ug | ORAL_TABLET | Freq: Every day | ORAL | Status: DC
  Administered 2014-12-08 – 2014-12-11 (×4): 112 ug
  Filled 2014-12-07 (×4): qty 1

## 2014-12-07 MED ORDER — ALBUTEROL SULFATE (2.5 MG/3ML) 0.083% IN NEBU: 2.5000 mg | INHALATION_SOLUTION | RESPIRATORY_TRACT | Status: DC

## 2014-12-07 MED ORDER — SODIUM CHLORIDE 0.9 % IV SOLN
INTRAVENOUS | Status: DC
  Administered 2014-12-09: via INTRAVENOUS

## 2014-12-07 MED ORDER — METHYLPREDNISOLONE SODIUM SUCC 40 MG IJ SOLR
40.0000 mg | Freq: Two times a day (BID) | INTRAMUSCULAR | Status: DC
  Administered 2014-12-07 – 2014-12-13 (×13): 40 mg via INTRAVENOUS
  Filled 2014-12-07 (×13): qty 1

## 2014-12-07 MED ORDER — FLUCONAZOLE 100MG IVPB
100.0000 mg | INTRAVENOUS | Status: AC
  Administered 2014-12-07 – 2014-12-08 (×2): 100 mg via INTRAVENOUS
  Filled 2014-12-07 (×2): qty 50

## 2014-12-07 MED ORDER — POLYETHYLENE GLYCOL 3350 17 G PO PACK
17.0000 g | PACK | Freq: Every day | ORAL | Status: DC
  Administered 2014-12-07 – 2014-12-10 (×4): 17 g
  Filled 2014-12-07 (×4): qty 1

## 2014-12-07 NOTE — Progress Notes (Signed)
Initial Nutrition Assessment   INTERVENTION: RD will follow tomorrow and provide additional rec's is she is unable to wean as expected.    NUTRITION DIAGNOSIS: Inadequate oral intake related to inability to eat as evidenced by vent status, NPO     GOAL: Pt to advance diet (Dysphagia 3 with nectar-thick liquids) and meet 100% of protein and 75% energy needs daily.    MONITOR: Respiratory status and diet advancment    REASON FOR ASSESSMENT:   Ventilator    ASSESSMENT: Patient is an obese 69 yo female who is currently intubated on ventilator support. She is expected to be extubated next 24-48 hrs. She is awake. Severe weight loss over the past year per hospital records. Hx of CHF and thyroid mass may be contributing?? Pt unable to provide details at this time. Will return after her extubation.  MV: 8.1 L/min  Temp (24hrs), Avg:97.9 F (36.6 C), Min:97.1 F (36.2 C), Max:98.3 F (36.8 C)  Height:  Ht Readings from Last 1 Encounters:  03/05/2015 5\' 11"  (1.803 m)    Weight:  Wt Readings from Last 1 Encounters:  12/07/14 239 lb 3.2 oz (108.5 kg)    Ideal Body Weight:  70.45 kg  Wt Readings from Last 10 Encounters:  12/07/14 239 lb 3.2 oz (108.5 kg)  11/30/14 242 lb 9.6 oz (110.043 kg)  11/17/14 260 lb (117.935 kg)  10/18/14 260 lb (117.935 kg)  09/15/14 264 lb (119.75 kg)  07/24/14 274 lb (124.286 kg)  03/18/14 289 lb 3.9 oz (131.2 kg)  02/08/14 292 lb (132.45 kg)  02/03/14 292 lb (132.45 kg)  01/04/14 296 lb 1.6 oz (134.31 kg)    BMI:  Body mass index is 33.38 kg/(m^2).  Estimated Nutritional Needs:  Kcal:  1795  Protein:  140 gr  Fluid:  >1.8 liters daily  Skin:   intact  Diet Order:  Diet NPO time specified  EDUCATION NEEDS: none at this time     Intake/Output Summary (Last 24 hours) at 12/07/14 1437 Last data filed at 12/07/14 0605  Gross per 24 hour  Intake 712.29 ml  Output    800 ml  Net -87.71 ml    Last BM:  7/7-watery stool  Royann ShiversLynn  Gifford Ballon MS,RD,CSG,LDN Office: [phone] Pager: [phone]

## 2014-12-07 NOTE — Consult Note (Signed)
Consult requested by: [facility] Consult requested for respiratory failure:  HPI: This is a 69 year old Caucasian female who was discharged from the hospital earlier this month and brought back to the hospital because of increasing shortness of breath and hypoxia. In the emergency department she had a chest x-ray done then had CT done and that was concern that she had aspirated and had aspiration pneumonia. She initially did relatively well and then last night developed bradycardia and hypoxia labored respirations and had to be intubated and placed on mechanical ventilation. This morning she is intubated and sedated but he is able to arouse and answer questions. She has multiple medical problems as listed below. She has a history of a brain aneurysm, COPD, CHF, sleep apnea and has been said to be a difficult intubation. She apparently does not use CPAP for sleep apnea.  Past Medical History  Diagnosis Date  . Brain aneurysm   . Neuropathy   . Diabetes mellitus   . Anemia   . Thyroid disease   . Arthritis   . Macular degeneration   . Chest pain   . Hypertension   . Hyperlipidemia   . Obesity   . Abnormal stress test     -false positive, prob breast attenuation  . Hx of cardiac catheterization 11/2013    normal coronary arteries  . Sleep apnea     doesn't wear CPAP, sleeps in a lift chair  . Difficult intubation 02/08/14    re-intubated in PACU  . COPD (chronic obstructive pulmonary disease)   . CHF (congestive heart failure)   . Brain aneurysm 2004  . Diabetes mellitus without complication   . Renal disorder   . Malnutrition      Family History  Problem Relation Age of Onset  . Diabetes      family history   . Arthritis      family history   . Diabetes Mother   . Hypertension Mother   . Seizures Sister   . Colon cancer Neg Hx      History   Social History  . Marital Status: Married    Spouse Name: N/A  . Number of Children: 1  . Years of Education: 12    Occupational History  . Retired    Social History Main Topics  . Smoking status: Former Smoker    Quit date: 06/02/2002  . Smokeless tobacco: Not on file  . Alcohol Use: No  . Drug Use: No  . Sexual Activity: No   Other Topics Concern  . None   Social History Narrative   ** Merged History Encounter **         ROS:     Objective: Vital signs in last 24 hours: Temp:  [97.1 F (36.2 C)-98.3 F (36.8 C)] 97.1 F (36.2 C) (07/07 0803) Pulse Rate:  [64-102] 70 (07/07 0700) Resp:  [12-29] 16 (07/07 0700) BP: (102-168)/(46-117) 111/61 mmHg (07/07 0700) SpO2:  [78 %-100 %] 94 % (07/07 0801) FiO2 (%):  [40 %-100 %] 50 % (07/07 0801) Weight:  [108.5 kg (239 lb 3.2 oz)] 108.5 kg (239 lb 3.2 oz) (07/07 0500) Weight change:  Last BM Date: 12/06/14  Intake/Output from previous day: 07/06 0701 - 07/07 0700 In: 782.3 [I.V.:322.3; NG/GT:10; IV Piggyback:450] Out: 800 [Urine:200; Emesis/NG output:600]  PHYSICAL EXAM She is intubated and sedated but arousable. Her pupils are reactive. Nose and throat are clear. Mucous membranes are moist. Her neck is supple. I don't feel any masses although she  is listed as having a right thyroid mass. Her chest shows rhonchi bilaterally with end-expiratory wheezes. Her heart is regular without gallops. Her abdomen is soft. Extremities showed no edema. She is arousable and can move all 4 extremities.  Lab Results: Basic Metabolic Panel:  Recent Labs  40/98/11 2009 12/07/14 0514  NA 142 145  K 4.9 4.2  CL 105 107  CO2 30 30  GLUCOSE 234* 112*  BUN 43* 41*  CREATININE 1.14* 1.14*  CALCIUM 7.9* 8.2*   Liver Function Tests:  Recent Labs  12/05/14 0648 12/06/14 2009  AST 18 32  ALT 45 47  ALKPHOS 67 56  BILITOT 0.8 0.7  PROT 7.2 6.3*  ALBUMIN 3.4* 3.0*   No results for input(s): LIPASE, AMYLASE in the last 72 hours. No results for input(s): AMMONIA in the last 72 hours. CBC:  Recent Labs  12/06/14 2009 12/07/14 0514  WBC  12.4* 14.1*  NEUTROABS 11.2* 10.2*  HGB 12.1 11.5*  HCT 40.8 38.3  MCV 90.3 88.5  PLT 231 239   Cardiac Enzymes: No results for input(s): CKTOTAL, CKMB, CKMBINDEX, TROPONINI in the last 72 hours. BNP: No results for input(s): PROBNP in the last 72 hours. D-Dimer: No results for input(s): DDIMER in the last 72 hours. CBG:  Recent Labs  12/06/14 1204 12/06/14 1632 12/06/14 2104 12/07/14 0037 12/07/14 0501 12/07/14 0740  GLUCAP 162* 179* 199* 155* 104* 100*   Hemoglobin A1C: No results for input(s): HGBA1C in the last 72 hours. Fasting Lipid Panel: No results for input(s): CHOL, HDL, LDLCALC, TRIG, CHOLHDL, LDLDIRECT in the last 72 hours. Thyroid Function Tests: No results for input(s): TSH, T4TOTAL, FREET4, T3FREE, THYROIDAB in the last 72 hours. Anemia Panel: No results for input(s): VITAMINB12, FOLATE, FERRITIN, TIBC, IRON, RETICCTPCT in the last 72 hours. Coagulation: No results for input(s): LABPROT, INR in the last 72 hours. Urine Drug Screen: Drugs of Abuse  No results found for: LABOPIA, COCAINSCRNUR, LABBENZ, AMPHETMU, THCU, LABBARB  Alcohol Level: No results for input(s): ETH in the last 72 hours. Urinalysis: No results for input(s): COLORURINE, LABSPEC, PHURINE, GLUCOSEU, HGBUR, BILIRUBINUR, KETONESUR, PROTEINUR, UROBILINOGEN, NITRITE, LEUKOCYTESUR in the last 72 hours.  Invalid input(s): APPERANCEUR Misc. Labs:   ABGS:  Recent Labs  12/07/14 0520  PHART 7.545*  PO2ART 53.1*  TCO2 27.8  HCO3 31.5*     MICROBIOLOGY: Recent Results (from the past 240 hour(s))  Blood culture (routine x 2)     Status: None (Preliminary result)   Collection Time: 12/04/14  4:00 AM  Result Value Ref Range Status   Specimen Description RIGHT ANTECUBITAL  Final   Special Requests BOTTLES DRAWN AEROBIC ONLY 7CC  Final   Culture PENDING  Incomplete   Report Status PENDING  Incomplete  Blood culture (routine x 2)     Status: None (Preliminary result)   Collection  Time: 12/04/14  4:16 AM  Result Value Ref Range Status   Specimen Description RIGHT ANTECUBITAL  Final   Special Requests BOTTLES DRAWN AEROBIC ONLY 6CC  Final   Culture PENDING  Incomplete   Report Status PENDING  Incomplete  MRSA PCR Screening     Status: None   Collection Time: 12/04/14  4:24 AM  Result Value Ref Range Status   MRSA by PCR NEGATIVE NEGATIVE Final    Comment:        The GeneXpert MRSA Assay (FDA approved for NASAL specimens only), is one component of a comprehensive MRSA colonization surveillance program. It is not intended to  diagnose MRSA infection nor to guide or monitor treatment for MRSA infections.     Studies/Results: Dg Abd 1 View  12/06/2014   CLINICAL DATA:  Code blue. Nasogastric tube placement. Initial encounter.  EXAM: ABDOMEN - 1 VIEW  COMPARISON:  CT of the abdomen and pelvis performed 11/17/2014, MRI of the lumbar spine performed 02/27/2012  FINDINGS: The patient's enteric tube is seen ending overlying the body of the stomach.  The visualized bowel gas pattern is unremarkable. The stomach is partially filled with air. Scattered air and stool filled loops of colon are seen; no abnormal dilatation of small bowel loops is seen to suggest small bowel obstruction. No free intra-abdominal air is identified, though evaluation for free air is limited on a single supine view.  No acute osseous abnormalities are seen. Bridging osteophytes are noted along the lower thoracic and lumbar spine; the sacroiliac joints are unremarkable in appearance.  IMPRESSION: Enteric tube seen ending overlying the body of the stomach.   Electronically Signed   By: Roanna RaiderJeffery  Chang M.D.   On: 12/06/2014 19:53   Portable Chest Xray  12/06/2014   CLINICAL DATA:  Status post intubation and ET tube placement.  EXAM: PORTABLE CHEST - 1 VIEW  COMPARISON:  CT chest 12/04/2014. Single view of the chest 2015/05/13.  FINDINGS: NG tube is in place the side port visualized in the stomach.  Endotracheal tube is also in place with the tip 4.8 cm above the carina. The right lung is clear. Left basilar airspace opacity, likely atelectasis, is noted and unchanged. Heart size is normal. No pneumothorax identified.  IMPRESSION: ET tube and NG tube in good position.  Left basilar atelectasis.   Electronically Signed   By: Drusilla Kannerhomas  Dalessio M.D.   On: 12/06/2014 19:53    Medications:  Prior to Admission:  Prescriptions prior to admission  Medication Sig Dispense Refill Last Dose  . Alum & Mag Hydroxide-Simeth (MAGIC MOUTHWASH) SOLN Take 10 mLs by mouth 3 (three) times daily.  0 12/04/2014 at Unknown time  . aspirin 81 MG tablet Take 162 mg by mouth daily.    12/04/2014 at Unknown time  . BREO ELLIPTA 100-25 MCG/INH AEPB Inhale 1 puff into the lungs daily.   12/04/2014 at Unknown time  . carvedilol (COREG) 25 MG tablet Take 1 tablet (25 mg total) by mouth 2 (two) times daily with a meal. 60 tablet 11 12/04/2014 at 0900  . Coenzyme Q10 (CO Q 10) 100 MG CAPS Take 100 mg by mouth 2 (two) times daily.    12/04/2014 at Unknown time  . ferrous sulfate 325 (65 FE) MG tablet Take 325 mg by mouth daily.   12/04/2014 at Unknown time  . fluconazole (DIFLUCAN) 100 MG tablet Take 1 tablet (100 mg total) by mouth daily. For 7 days 7 tablet 0 12/04/2014 at Unknown time  . folic acid (FOLVITE) 1 MG tablet Take 1 tablet (1 mg total) by mouth daily. 90 tablet 3 12/04/2014 at Unknown time  . furosemide (LASIX) 40 MG tablet Take 40 mg by mouth daily.    12/04/2014 at Unknown time  . gabapentin (NEURONTIN) 100 MG capsule Take 100 mg by mouth 3 (three) times daily.   12/04/2014 at Unknown time  . levalbuterol (XOPENEX) 0.63 MG/3ML nebulizer solution Take 3 mLs by nebulization every 6 (six) hours as needed for wheezing or shortness of breath.    unknown  . levothyroxine (SYNTHROID, LEVOTHROID) 112 MCG tablet Take 112 mcg by mouth daily before breakfast.  12/04/2014 at Unknown time  . lidocaine (XYLOCAINE) 2 % solution Use as directed 10  mLs in the mouth or throat 3 (three) times daily. 100 mL 0 12/04/2014 at Unknown time  . niacin 500 MG tablet Take 500 mg by mouth at bedtime.    02-Jan-2015 at Unknown time  . ondansetron (ZOFRAN ODT) 8 MG disintegrating tablet Take 1 tablet (8 mg total) by mouth every 8 (eight) hours as needed.  ODT q4 hours prn nausea 12 tablet 0 unknown  . potassium chloride SA (K-DUR,KLOR-CON) 20 MEQ tablet Take 20 mEq by mouth 2 (two) times daily.   12/04/2014 at Unknown time  . pravastatin (PRAVACHOL) 80 MG tablet Take 1 tablet (80 mg total) by mouth daily. 90 tablet 3 01-02-15 at Unknown time  . predniSONE (DELTASONE) 20 MG tablet Take  po daily for 3 more days (Patient taking differently: Take 40 mg by mouth 3 (three) times daily. )   12/04/2014 at Unknown time  . ranitidine (ZANTAC) 150 MG tablet Take 150 mg by mouth at bedtime.   01/02/15 at Unknown time  . SPIRIVA HANDIHALER 18 MCG inhalation capsule Take 18 mcg by mouth daily.   12/04/2014 at Unknown time  . ZETIA 10 MG tablet TAKE ONE TABLET BY MOUTH ONCE DAILY. 30 tablet 11 12/04/2014 at Unknown time   Scheduled: . albuterol  2.5 mg Nebulization BID  . antiseptic oral rinse  7 mL Mouth Rinse QID  . aspirin EC  162 mg Oral Daily  . carvedilol  25 mg Oral BID WC  . chlorhexidine  15 mL Mouth Rinse BID  . docusate sodium  100 mg Oral BID  . enoxaparin (LOVENOX) injection  40 mg Subcutaneous Q24H  . ezetimibe  10 mg Oral Daily  . famotidine (PEPCID) IV  20 mg Intravenous Q12H  . fluconazole  100 mg Oral Daily  . folic acid  1 mg Oral Daily  . gabapentin  100 mg Oral TID  . insulin aspart  0-15 Units Subcutaneous 6 times per day  . insulin detemir  5 Units Subcutaneous QHS  . levothyroxine  112 mcg Oral QAC breakfast  . niacin  500 mg Oral QHS  . piperacillin-tazobactam (ZOSYN)  IV  3.375 g Intravenous Q8H  . potassium chloride SA  20 mEq Oral BID  . pravastatin  80 mg Oral Daily  . vancomycin  1,250 mg Intravenous Q12H   Continuous: . sodium  chloride 10 mL/hr at 12/07/14 0605  . fentaNYL infusion INTRAVENOUS 100 mcg/hr (12/07/14 0605)   ZOX:WRUEAVWUJ, fentaNYL, HYDROcodone-acetaminophen, LORazepam, midazolam, midazolam, ondansetron **OR** ondansetron (ZOFRAN) IV, senna  Assesment: She was admitted with acute hypoxic respiratory failure secondary to aspiration pneumonia and her previous history of COPD. She suffered cardiorespiratory compromise yesterday and was intubated and placed on mechanical ventilation. She is still hypoxic despite 55% oxygen. She is not ready for weaning or extubation. Principal Problem:   Acute respiratory failure with hypoxia Active Problems:   Essential hypertension   DM type 2 (diabetes mellitus, type 2)   Aspiration pneumonia   Thyroid mass    Plan: Continue ventilator support. She is on appropriate antibiotics. I added Solu-Medrol. She had been on prednisone by mouth. Increased frequency of her nebulizer treatments. No other changes now.  Thanks for allowing me to see her with you    LOS: 3 days   Amariana Mirando L 12/07/2014, 8:06 AM

## 2014-12-07 NOTE — Progress Notes (Addendum)
Around 715pm RT in room patient brady down, became hypoxic and cyanotic.  Code blue was called.  Dr Manus Gunningrancour arrived, proceeded to intubating prior to my arrival.  On my arrival, dr rancour was in process of intubating.  Pt never lost a pulse.  bp was stable.  She did have a couple of brief periods of bradycardia which spontaneously resolved without intervention.  PCCM involved, ordered labs, vent orders, sedation protocol.  No ACLS drugs were needed.  Updated husband at bedside, worsening respiratory failure due to aspiration pneumonititis.  On iv vanc/zosyn.  Discussed steroids with PCCM who wanted to hold on iv steroids at this time.   Lung with diffuse coarse/rhonchi sounds but equal postintubation.  No charge.  Events occurred on 12/06/14.  PCCM dr Dema Severinmungal in black box responded also to code.  Appreciate ED and PCCM help.

## 2014-12-07 NOTE — Care Management Note (Signed)
Case Management Note  Patient Details  Name: Lesly RubensteinBarbara A Wichman MRN: [id number] Date of Birth: 08/27/1945   Expected Discharge Date:  12/08/14               Expected Discharge Plan:  Skilled Nursing Facility  In-House Referral:  Clinical Social Work  Discharge planning Services  CM Consult  Post Acute Care Choice:  NA Choice offered to:  NA  DME Arranged:    DME Agency:     HH Arranged:    HH Agency:     Status of Service:  Completed, signed off  Medicare Important Message Given:    Date Medicare IM Given:    Medicare IM give by:    Date Additional Medicare IM Given:    Additional Medicare Important Message give by:     If discussed at Long Length of Stay Meetings, dates discussed:    Additional Comments: Pt intubated over night. Pt's family requests palliative care consult for GOC discussion. CM to make MD aware. Will cont to follow.  Malcolm Metrohildress, Brookie Wayment Demske, RN 12/07/2014, 1:47 PM

## 2014-12-07 NOTE — Progress Notes (Addendum)
PROGRESS NOTE  Samantha [name]:[id number] DOB: [birth year] DOA: 12/02/2014 PCP: Colette Ribas, MD  Summary: 69 year old woman with history of COPD, dysphagia presented with shortness of breath and hypoxic respiratory failure requiring high flow oxygen. CT chest revealed aspiration pneumonia. Treated with IV abx with some improvement. Subsequently decompensated with severe hypoxia and was intubated.  Assessment/Plan: 1. Acute hypoxic, hypercapnic respiratory failure with respiratory acidosis secondary to aspiration pneumonia. Intubated 7/6, suspect aspiration event. Appears stable on vent.  2. Aspiration pneumonia. CT negative for PE. Repeat CXR without significant change. Afebrile, VSS, no significant change in WBC. 3. COPD, appears stable.  4. Chronic dysphagia. Last admission recommendations per speech therapy: Dysphagia 3 diet with nectar thick liquids, no straws. 5. Hypothyroidism. Solitary 2.9 cm right thyroid mass found on ultrasound. Suggest outpatient evaluation. 6. DM. Remains stable.    Critically ill but appears stable on ventilator. Plan to continue empiric antibiotics, vent management per pulmonology. Hopefully can be extubated in the next 24-48 hours.  Adjust medications for route via tube or IV.  Consider palliative care consult will discuss with patient and family. When family available.   F/u TSH. Consider outpatient FNA.    ADDENDUM Discussed with husband at bedside.  Code Status: full code DVT prophylaxis: Lovenox Family Communication: none present. Patient alert and understands plan again 7/7  Disposition Plan: hopefully return to [facility]  Brendia Sacks, MD  [facility]  Pager [phone] If 7PM-7AM, please contact night-coverage at [email or web address], password [facility] 12/07/2014, 8:00 AM  LOS: 3 days   Consultants:    Procedures:  ETT 7/6 >>  Antibiotics:  Vancomycin 7/4 >>  Zosyn 7/4 >>  HPI/Subjective: Last evening patient became  profoundly hypoxic, bradycardic and was emergently intubated. Never lost pulse, no ACLS drugs.    Intubated, but awake and alert and follows commands.  Objective: Filed Vitals:   12/07/14 0615 12/07/14 0630 12/07/14 0645 12/07/14 0700  BP: 122/59 128/57 124/46 111/61  Pulse: 71 69 71 70  Temp:      TempSrc:      Resp: Height:      Weight:      SpO2: 92% 92% 94% 95%    Intake/Output Summary (Last 24 hours) at 12/07/14 0800 Last data filed at 12/07/14 0605  Gross per 24 hour  Intake 772.29 ml  Output    800 ml  Net -27.71 ml     Filed Weights   12/20/2014 2255 12/05/14 0500 12/07/14 0500  Weight: 109.77 kg (242 lb) 105.7 kg (233 lb 0.4 oz) 108.5 kg (239 lb 3.2 oz)    Exam:    Afebrile, VSS, FiO2 50%, PEEP 5 General: Appears calm and comfortable; awake, alert Eyes: PERRL, normal lids, irises   ENT: grossly normal hearing, lips   Cardiovascular: RRR, no m/r/g. No LE edema. Telemetry: SR, no arrhythmias  Respiratory: coarse breath sounds bilaterally, fair air movement, no wheezes or rales. resp effort appears stable on vent Abdomen: soft, ntnd Skin: no rash noted Musculoskeletal: grossly normal tone BUE/LLE, moves to command. RLE hemiplegia (chronic) Psychiatric: grossly normal mood and affect Neurologic: grossly non-focal.  New data reviewed:  ABG 7.545/36/53  BMP stable  Lactic acid normal  WBC 14.1  Hgb stable 11.5  CXR ETT in position. Left base atelectasis  EKG SR, PVC  Pertinent data since admission:  Hemoglobin A1c 6.8  CT angiogram of the chest 7/4: No PE. Left lower lobe airspace opacification concerning for aspiration. Right thyroid lobe mass  seen.  Pending data:  Blood cultures  Scheduled Meds: . albuterol  2.5 mg Nebulization BID  . antiseptic oral rinse  7 mL Mouth Rinse QID  . aspirin EC  162 mg Oral Daily  . carvedilol  25 mg Oral BID WC  . chlorhexidine  15 mL Mouth Rinse BID  . docusate sodium  100 mg Oral BID  .  enoxaparin (LOVENOX) injection  40 mg Subcutaneous Q24H  . ezetimibe  10 mg Oral Daily  . famotidine (PEPCID) IV  20 mg Intravenous Q12H  . fluconazole  100 mg Oral Daily  . folic acid  1 mg Oral Daily  . gabapentin  100 mg Oral TID  . insulin aspart  0-15 Units Subcutaneous 6 times per day  . insulin detemir  5 Units Subcutaneous QHS  . levothyroxine  112 mcg Oral QAC breakfast  . niacin  500 mg Oral QHS  . piperacillin-tazobactam (ZOSYN)  IV  3.375 g Intravenous Q8H  . potassium chloride SA  20 mEq Oral BID  . pravastatin  80 mg Oral Daily  . vancomycin  1,250 mg Intravenous Q12H   Continuous Infusions: . sodium chloride 10 mL/hr at 12/07/14 0605  . fentaNYL infusion INTRAVENOUS 100 mcg/hr (12/07/14 [id number])    Principal Problem:   Acute respiratory failure with hypoxia Active Problems:   Essential hypertension   DM type 2 (diabetes mellitus, type 2)   Aspiration pneumonia   Thyroid mass   Time spent 35 minutes

## 2014-12-08 ENCOUNTER — Inpatient Hospital Stay (HOSPITAL_COMMUNITY): Payer: Commercial Managed Care - HMO

## 2014-12-08 LAB — GLUCOSE, CAPILLARY
GLUCOSE-CAPILLARY: 159 mg/dL — AB (ref 65–99)
GLUCOSE-CAPILLARY: 162 mg/dL — AB (ref 65–99)
GLUCOSE-CAPILLARY: 189 mg/dL — AB (ref 65–99)
Glucose-Capillary: 161 mg/dL — ABNORMAL HIGH (ref 65–99)
Glucose-Capillary: 148 mg/dL — ABNORMAL HIGH (ref 65–99)
Glucose-Capillary: 157 mg/dL — ABNORMAL HIGH (ref 65–99)
Glucose-Capillary: 177 mg/dL — ABNORMAL HIGH (ref 65–99)

## 2014-12-08 LAB — BLOOD GAS, ARTERIAL
ACID-BASE EXCESS: 3 mmol/L — AB (ref 0.0–2.0)
Bicarbonate: 26.1 mEq/L — ABNORMAL HIGH (ref 20.0–24.0)
Drawn by: 382351
FIO2: 0.4 %
LHR: 16 {breaths}/min
O2 Saturation: 97.5 %
PCO2 ART: 33.7 mmHg — AB (ref 35.0–45.0)
PEEP: 5 cmH2O
Patient temperature: 37
TCO2: 23.3 mmol/L (ref 0–100)
VT: 500 mL
pH, Arterial: 7.501 — ABNORMAL HIGH (ref 7.350–7.450)
pO2, Arterial: 90.2 mmHg (ref 80.0–100.0)

## 2014-12-08 LAB — CBC WITH DIFFERENTIAL/PLATELET
Basophils Absolute: 0 10*3/uL (ref 0.0–0.1)
Basophils Relative: 0 % (ref 0–1)
EOS ABS: 0 10*3/uL (ref 0.0–0.7)
Eosinophils Relative: 0 % (ref 0–5)
HEMATOCRIT: 35.9 % — AB (ref 36.0–46.0)
HEMOGLOBIN: 11.2 g/dL — AB (ref 12.0–15.0)
Lymphocytes Relative: 11 % — ABNORMAL LOW (ref 12–46)
Lymphs Abs: 0.8 10*3/uL (ref 0.7–4.0)
MCH: 26.8 pg (ref 26.0–34.0)
MCHC: 31.2 g/dL (ref 30.0–36.0)
MCV: 85.9 fL (ref 78.0–100.0)
MONO ABS: 0.4 10*3/uL (ref 0.1–1.0)
MONOS PCT: 5 % (ref 3–12)
NEUTROS ABS: 5.9 10*3/uL (ref 1.7–7.7)
Neutrophils Relative %: 84 % — ABNORMAL HIGH (ref 43–77)
Platelets: 208 10*3/uL (ref 150–400)
RBC: 4.18 MIL/uL (ref 3.87–5.11)
RDW: 15.5 % (ref 11.5–15.5)
WBC: 7 10*3/uL (ref 4.0–10.5)

## 2014-12-08 LAB — BASIC METABOLIC PANEL
ANION GAP: 8 (ref 5–15)
BUN: 39 mg/dL — ABNORMAL HIGH (ref 6–20)
CO2: 26 mmol/L (ref 22–32)
Calcium: 8.1 mg/dL — ABNORMAL LOW (ref 8.9–10.3)
Chloride: 111 mmol/L (ref 101–111)
Creatinine, Ser: 1.19 mg/dL — ABNORMAL HIGH (ref 0.44–1.00)
GFR calc non Af Amer: 46 mL/min — ABNORMAL LOW (ref >60)
GFR, EST AFRICAN AMERICAN: 53 mL/min — AB (ref >60)
Glucose, Bld: 177 mg/dL — ABNORMAL HIGH (ref 65–99)
POTASSIUM: 4.4 mmol/L (ref 3.5–5.1)
Sodium: 145 mmol/L (ref 135–145)

## 2014-12-08 LAB — VANCOMYCIN, TROUGH: Vancomycin Tr: 57 ug/mL (ref 10.0–20.0)

## 2014-12-08 MED ORDER — FENTANYL CITRATE (PF) 2500 MCG/50ML IJ SOLN
INTRAMUSCULAR | Status: AC
  Filled 2014-12-08: qty 50

## 2014-12-08 MED ORDER — LORAZEPAM 2 MG/ML IJ SOLN
0.5000 mg | INTRAMUSCULAR | Status: DC | PRN
  Administered 2014-12-08 – 2014-12-12 (×7): 0.5 mg via INTRAVENOUS
  Filled 2014-12-08 (×8): qty 1

## 2014-12-08 NOTE — Progress Notes (Signed)
Subjective: She is awake and alert and weaning from the ventilator. It is noted that she had a very weak cough effort when she was on the floor and is concerned that she has aspirated.  Objective: Vital signs in last 24 hours: Temp:  [97.5 F (36.4 C)-98.2 F (36.8 C)] 97.5 F (36.4 C) (07/08 0400) Pulse Rate:  [51-94] 56 (07/08 0700) Resp:  [13-28] 16 (07/08 0700) BP: (105-155)/(49-117) 134/66 mmHg (07/08 0700) SpO2:  [92 %-100 %] 95 % (07/08 0700) FiO2 (%):  [40 %-50 %] 40 % (07/08 0735) Weight:  [108.6 kg (239 lb 6.7 oz)] 108.6 kg (239 lb 6.7 oz) (07/08 0500) Weight change: 0.1 kg (3.5 oz) Last BM Date: 12/06/14  Intake/Output from previous day: 07/07 0701 - 07/08 0700 In: 1657.5 [I.V.:1197.5; NG/GT:40; IV Piggyback:300] Out: 1075 [Urine:1000; Emesis/NG output:75]  PHYSICAL EXAM General appearance: alert, cooperative and mild distress Resp: clear to auscultation bilaterally Cardio: regular rate and rhythm, S1, S2 normal, no murmur, click, rub or gallop GI: soft, non-tender; bowel sounds normal; no masses,  no organomegaly Extremities: extremities normal, atraumatic, no cyanosis or edema  Lab Results:  Results for orders placed or performed during the hospital encounter of 12/15/2014 (from the past 48 hour(s))  Glucose, capillary     Status: Abnormal   Collection Time: 12/06/14 12:04 PM  Result Value Ref Range   Glucose-Capillary 162 (H) 65 - 99 mg/dL   Comment 1 Notify RN    Comment 2 Document in Chart   Glucose, capillary     Status: Abnormal   Collection Time: 12/06/14  4:32 PM  Result Value Ref Range   Glucose-Capillary 179 (H) 65 - 99 mg/dL   Comment 1 Notify RN    Comment 2 Document in Chart   Lactic acid, plasma     Status: None   Collection Time: 12/06/14  8:09 PM  Result Value Ref Range   Lactic Acid, Venous 2.0 0.5 - 2.0 mmol/L  Comprehensive metabolic panel     Status: Abnormal   Collection Time: 12/06/14  8:09 PM  Result Value Ref Range   Sodium 142 135  - 145 mmol/L   Potassium 4.9 3.5 - 5.1 mmol/L   Chloride 105 101 - 111 mmol/L   CO2 30 22 - 32 mmol/L   Glucose, Bld 234 (H) 65 - 99 mg/dL   BUN 43 (H) 6 - 20 mg/dL   Creatinine, Ser 1.14 (H) 0.44 - 1.00 mg/dL   Calcium 7.9 (L) 8.9 - 10.3 mg/dL   Total Protein 6.3 (L) 6.5 - 8.1 g/dL   Albumin 3.0 (L) 3.5 - 5.0 g/dL   AST 32 15 - 41 U/L   ALT 47 14 - 54 U/L   Alkaline Phosphatase 56 38 - 126 U/L   Total Bilirubin 0.7 0.3 - 1.2 mg/dL   GFR calc non Af Amer 48 (L) >60 mL/min   GFR calc Af Amer 56 (L) >60 mL/min    Comment: (NOTE) The eGFR has been calculated using the CKD EPI equation. This calculation has not been validated in all clinical situations. eGFR's persistently <60 mL/min signify possible Chronic Kidney Disease.    Anion gap 7 5 - 15  CBC with Differential/Platelet     Status: Abnormal   Collection Time: 12/06/14  8:09 PM  Result Value Ref Range   WBC 12.4 (H) 4.0 - 10.5 K/uL   RBC 4.52 3.87 - 5.11 MIL/uL   Hemoglobin 12.1 12.0 - 15.0 g/dL   HCT 40.8 36.0 -  46.0 %   MCV 90.3 78.0 - 100.0 fL   MCH 26.8 26.0 - 34.0 pg   MCHC 29.7 (L) 30.0 - 36.0 g/dL   RDW 15.2 11.5 - 15.5 %   Platelets 231 150 - 400 K/uL   Neutrophils Relative % 91 (H) 43 - 77 %   Neutro Abs 11.2 (H) 1.7 - 7.7 K/uL   Lymphocytes Relative 6 (L) 12 - 46 %   Lymphs Abs 0.8 0.7 - 4.0 K/uL   Monocytes Relative 3 3 - 12 %   Monocytes Absolute 0.4 0.1 - 1.0 K/uL   Eosinophils Relative 0 0 - 5 %   Eosinophils Absolute 0.0 0.0 - 0.7 K/uL   Basophils Relative 0 0 - 1 %   Basophils Absolute 0.0 0.0 - 0.1 K/uL  Glucose, capillary     Status: Abnormal   Collection Time: 12/06/14  9:04 PM  Result Value Ref Range   Glucose-Capillary 199 (H) 65 - 99 mg/dL  Draw ABG 1 hour after initiation of ventilator     Status: Abnormal   Collection Time: 12/06/14  9:17 PM  Result Value Ref Range   FIO2 0.50 %   Delivery systems VENTILATOR    Mode PRESSURE REGULATED VOLUME CONTROL    VT 500 mL   Rate 12 resp/min    Peep/cpap 6.0 cm H20   pH, Arterial 7.365 7.350 - 7.450   pCO2 arterial 58.8 (HH) 35.0 - 45.0 mmHg    Comment: CRITICAL RESULT CALLED TO, READ BACK BY AND VERIFIED WITH: S.HAMMOCK,RN BY B.STOPHEL,RRT,RCP ON 12/06/14 AT 21:23.    pO2, Arterial 53.2 (L) 80.0 - 100.0 mmHg   Bicarbonate 32.8 (H) 20.0 - 24.0 mEq/L   TCO2 30.0 0 - 100 mmol/L   Acid-Base Excess 7.5 (H) 0.0 - 2.0 mmol/L   O2 Saturation 87.1 %   Patient temperature 37.0    Collection site RIGHT RADIAL    Drawn by [phone]    Sample type ARTERIAL DRAW    Allens test (pass/fail) PASS PASS  Glucose, capillary     Status: Abnormal   Collection Time: 12/07/14 12:37 AM  Result Value Ref Range   Glucose-Capillary 155 (H) 65 - 99 mg/dL  Glucose, capillary     Status: Abnormal   Collection Time: 12/07/14  5:01 AM  Result Value Ref Range   Glucose-Capillary 104 (H) 65 - 99 mg/dL  CBC with Differential/Platelet     Status: Abnormal   Collection Time: 12/07/14  5:14 AM  Result Value Ref Range   WBC 14.1 (H) 4.0 - 10.5 K/uL   RBC 4.33 3.87 - 5.11 MIL/uL   Hemoglobin 11.5 (L) 12.0 - 15.0 g/dL   HCT 38.3 36.0 - 46.0 %   MCV 88.5 78.0 - 100.0 fL   MCH 26.6 26.0 - 34.0 pg   MCHC 30.0 30.0 - 36.0 g/dL   RDW 15.1 11.5 - 15.5 %   Platelets 239 150 - 400 K/uL   Neutrophils Relative % 72 43 - 77 %   Neutro Abs 10.2 (H) 1.7 - 7.7 K/uL   Lymphocytes Relative 20 12 - 46 %   Lymphs Abs 2.8 0.7 - 4.0 K/uL   Monocytes Relative 8 3 - 12 %   Monocytes Absolute 1.1 (H) 0.1 - 1.0 K/uL   Eosinophils Relative 0 0 - 5 %   Eosinophils Absolute 0.0 0.0 - 0.7 K/uL   Basophils Relative 0 0 - 1 %   Basophils Absolute 0.0 0.0 - 0.1 K/uL  Basic metabolic panel     Status: Abnormal   Collection Time: 12/07/14  5:14 AM  Result Value Ref Range   Sodium 145 135 - 145 mmol/L   Potassium 4.2 3.5 - 5.1 mmol/L   Chloride 107 101 - 111 mmol/L   CO2 30 22 - 32 mmol/L   Glucose, Bld 112 (H) 65 - 99 mg/dL   BUN 41 (H) 6 - 20 mg/dL   Creatinine, Ser 1.14 (H) 0.44  - 1.00 mg/dL   Calcium 8.2 (L) 8.9 - 10.3 mg/dL   GFR calc non Af Amer 48 (L) >60 mL/min   GFR calc Af Amer 56 (L) >60 mL/min    Comment: (NOTE) The eGFR has been calculated using the CKD EPI equation. This calculation has not been validated in all clinical situations. eGFR's persistently <60 mL/min signify possible Chronic Kidney Disease.    Anion gap 8 5 - 15  Blood gas, arterial     Status: Abnormal   Collection Time: 12/07/14  5:20 AM  Result Value Ref Range   FIO2 0.55 %   Delivery systems VENTILATOR    Mode PRESSURE REGULATED VOLUME CONTROL    VT 500 mL   Rate 18 resp/min   Peep/cpap 6.0 cm H20   pH, Arterial 7.545 (H) 7.350 - 7.450   pCO2 arterial 36.5 35.0 - 45.0 mmHg   pO2, Arterial 53.1 (L) 80.0 - 100.0 mmHg   Bicarbonate 31.5 (H) 20.0 - 24.0 mEq/L   TCO2 27.8 0 - 100 mmol/L   Acid-Base Excess 8.4 (H) 0.0 - 2.0 mmol/L   O2 Saturation 91.5 %   Patient temperature 37.0    Collection site RIGHT RADIAL    Drawn by [phone]    Sample type ARTERIAL DRAW    Allens test (pass/fail) PASS PASS  Glucose, capillary     Status: Abnormal   Collection Time: 12/07/14  7:40 AM  Result Value Ref Range   Glucose-Capillary 100 (H) 65 - 99 mg/dL   Comment 1 Notify RN    Comment 2 Document in Chart   Glucose, capillary     Status: Abnormal   Collection Time: 12/07/14 11:58 AM  Result Value Ref Range   Glucose-Capillary 131 (H) 65 - 99 mg/dL   Comment 1 Notify RN    Comment 2 Document in Chart   Glucose, capillary     Status: Abnormal   Collection Time: 12/07/14  4:41 PM  Result Value Ref Range   Glucose-Capillary 187 (H) 65 - 99 mg/dL   Comment 1 Notify RN    Comment 2 Document in Chart   Glucose, capillary     Status: Abnormal   Collection Time: 12/07/14  7:29 PM  Result Value Ref Range   Glucose-Capillary 189 (H) 65 - 99 mg/dL  Glucose, capillary     Status: Abnormal   Collection Time: 12/08/14 12:37 AM  Result Value Ref Range   Glucose-Capillary 189 (H) 65 - 99 mg/dL   Glucose, capillary     Status: Abnormal   Collection Time: 12/08/14  4:30 AM  Result Value Ref Range   Glucose-Capillary 161 (H) 65 - 99 mg/dL  Blood gas, arterial     Status: Abnormal   Collection Time: 12/08/14  5:03 AM  Result Value Ref Range   FIO2 0.40 %   Delivery systems VENTILATOR    Mode PRESSURE REGULATED VOLUME CONTROL    VT 500 mL   Rate 16 resp/min   Peep/cpap 5.0 cm H20   pH,  Arterial 7.501 (H) 7.350 - 7.450   pCO2 arterial 33.7 (L) 35.0 - 45.0 mmHg   pO2, Arterial 90.2 80.0 - 100.0 mmHg   Bicarbonate 26.1 (H) 20.0 - 24.0 mEq/L   TCO2 23.3 0 - 100 mmol/L   Acid-Base Excess 3.0 (H) 0.0 - 2.0 mmol/L   O2 Saturation 97.5 %   Patient temperature 37.0    Collection site RIGHT RADIAL    Drawn by [phone]    Sample type ARTERIAL DRAW    Allens test (pass/fail) PASS PASS  Vancomycin, trough     Status: Abnormal   Collection Time: 12/08/14  5:05 AM  Result Value Ref Range   Vancomycin Tr 57 (HH) 10.0 - 20.0 ug/mL    Comment: CRITICAL RESULT CALLED TO, READ BACK BY AND VERIFIED WITH: DANIEL,J AT 6:20AM ON 12/08/14 BY [facility]   Basic metabolic panel     Status: Abnormal   Collection Time: 12/08/14  5:05 AM  Result Value Ref Range   Sodium 145 135 - 145 mmol/L   Potassium 4.4 3.5 - 5.1 mmol/L   Chloride 111 101 - 111 mmol/L   CO2 26 22 - 32 mmol/L   Glucose, Bld 177 (H) 65 - 99 mg/dL   BUN 39 (H) 6 - 20 mg/dL   Creatinine, Ser 1.19 (H) 0.44 - 1.00 mg/dL   Calcium 8.1 (L) 8.9 - 10.3 mg/dL   GFR calc non Af Amer 46 (L) >60 mL/min   GFR calc Af Amer 53 (L) >60 mL/min    Comment: (NOTE) The eGFR has been calculated using the CKD EPI equation. This calculation has not been validated in all clinical situations. eGFR's persistently <60 mL/min signify possible Chronic Kidney Disease.    Anion gap 8 5 - 15  CBC with Differential/Platelet     Status: Abnormal   Collection Time: 12/08/14  5:20 AM  Result Value Ref Range   WBC 7.0 4.0 - 10.5 K/uL   RBC 4.18 3.87 -  5.11 MIL/uL   Hemoglobin 11.2 (L) 12.0 - 15.0 g/dL   HCT 35.9 (L) 36.0 - 46.0 %   MCV 85.9 78.0 - 100.0 fL   MCH 26.8 26.0 - 34.0 pg   MCHC 31.2 30.0 - 36.0 g/dL   RDW 15.5 11.5 - 15.5 %   Platelets 208 150 - 400 K/uL   Neutrophils Relative % 84 (H) 43 - 77 %   Neutro Abs 5.9 1.7 - 7.7 K/uL   Lymphocytes Relative 11 (L) 12 - 46 %   Lymphs Abs 0.8 0.7 - 4.0 K/uL   Monocytes Relative 5 3 - 12 %   Monocytes Absolute 0.4 0.1 - 1.0 K/uL   Eosinophils Relative 0 0 - 5 %   Eosinophils Absolute 0.0 0.0 - 0.7 K/uL   Basophils Relative 0 0 - 1 %   Basophils Absolute 0.0 0.0 - 0.1 K/uL    ABGS  Recent Labs  12/08/14 0503  PHART 7.501*  PO2ART 90.2  TCO2 23.3  HCO3 26.1*   CULTURES Recent Results (from the past 240 hour(s))  Blood culture (routine x 2)     Status: None (Preliminary result)   Collection Time: 12/04/14  4:00 AM  Result Value Ref Range Status   Specimen Description RIGHT ANTECUBITAL  Final   Special Requests BOTTLES DRAWN AEROBIC ONLY [place]  Final   Culture NO GROWTH 3 DAYS  Final   Report Status PENDING  Incomplete  Blood culture (routine x 2)     Status: None (  Preliminary result)   Collection Time: 12/04/14  4:16 AM  Result Value Ref Range Status   Specimen Description RIGHT ANTECUBITAL  Final   Special Requests BOTTLES DRAWN AEROBIC ONLY 6CC  Final   Culture NO GROWTH 3 DAYS  Final   Report Status PENDING  Incomplete  MRSA PCR Screening     Status: None   Collection Time: 12/04/14  4:24 AM  Result Value Ref Range Status   MRSA by PCR NEGATIVE NEGATIVE Final    Comment:        The GeneXpert MRSA Assay (FDA approved for NASAL specimens only), is one component of a comprehensive MRSA colonization surveillance program. It is not intended to diagnose MRSA infection nor to guide or monitor treatment for MRSA infections.    Studies/Results: Dg Abd 1 View  12/06/2014   CLINICAL DATA:  Code blue. Nasogastric tube placement. Initial encounter.  EXAM: ABDOMEN -  1 VIEW  COMPARISON:  CT of the abdomen and pelvis performed 11/17/2014, MRI of the lumbar spine performed 02/27/2012  FINDINGS: The patient's enteric tube is seen ending overlying the body of the stomach.  The visualized bowel gas pattern is unremarkable. The stomach is partially filled with air. Scattered air and stool filled loops of colon are seen; no abnormal dilatation of small bowel loops is seen to suggest small bowel obstruction. No free intra-abdominal air is identified, though evaluation for free air is limited on a single supine view.  No acute osseous abnormalities are seen. Bridging osteophytes are noted along the lower thoracic and lumbar spine; the sacroiliac joints are unremarkable in appearance.  IMPRESSION: Enteric tube seen ending overlying the body of the stomach.   Electronically Signed   By: Garald Balding M.D.   On: 12/06/2014 19:53   Dg Chest Port 1 View  12/08/2014   CLINICAL DATA:  Routine follow-up.  Ventilator patient.  EXAM: PORTABLE CHEST - 1 VIEW  COMPARISON:  12/06/2014 radiographs.  CT 12/04/2014.  FINDINGS: 0612 hr. Endotracheal tube tip remains the midtrachea. Nasogastric tube projects below the diaphragm, tip not visualized. Overall pulmonary aeration has improved with mild residual left greater than right basilar atelectasis. There is no edema, pleural effusion or pneumothorax. The heart size and mediastinal contours are stable.  IMPRESSION: Interval improved aeration of the lung bases. Stable support system.   Electronically Signed   By: Richardean Sale M.D.   On: 12/08/2014 07:51   Portable Chest Xray  12/06/2014   CLINICAL DATA:  Status post intubation and ET tube placement.  EXAM: PORTABLE CHEST - 1 VIEW  COMPARISON:  CT chest 12/04/2014. Single view of the chest 12/30/2014.  FINDINGS: NG tube is in place the side port visualized in the stomach. Endotracheal tube is also in place with the tip 4.8 cm above the carina. The right lung is clear. Left basilar airspace opacity,  likely atelectasis, is noted and unchanged. Heart size is normal. No pneumothorax identified.  IMPRESSION: ET tube and NG tube in good position.  Left basilar atelectasis.   Electronically Signed   By: Inge Rise M.D.   On: 12/06/2014 19:53    Medications:  Prior to Admission:  Prescriptions prior to admission  Medication Sig Dispense Refill Last Dose  . Alum & Mag Hydroxide-Simeth (MAGIC MOUTHWASH) SOLN Take 10 mLs by mouth 3 (three) times daily.  0 12/04/2014 at Unknown time  . aspirin 81 MG tablet Take 162 mg by mouth daily.    12/04/2014 at Unknown time  . BREO ELLIPTA  100-25 MCG/INH AEPB Inhale 1 puff into the lungs daily.   12/04/2014 at Unknown time  . carvedilol (COREG) 25 MG tablet Take 1 tablet (25 mg total) by mouth 2 (two) times daily with a meal. 60 tablet 11 12/04/2014 at 0900  . Coenzyme Q10 (CO Q 10) 100 MG CAPS Take 100 mg by mouth 2 (two) times daily.    12/04/2014 at Unknown time  . ferrous sulfate 325 (65 FE) MG tablet Take 325 mg by mouth daily.   12/04/2014 at Unknown time  . fluconazole (DIFLUCAN) 100 MG tablet Take 1 tablet (100 mg total) by mouth daily. For 7 days 7 tablet 0 12/04/2014 at Unknown time  . folic acid (FOLVITE) 1 MG tablet Take 1 tablet (1 mg total) by mouth daily. 90 tablet 3 12/04/2014 at Unknown time  . furosemide (LASIX) 40 MG tablet Take 40 mg by mouth daily.    12/04/2014 at Unknown time  . gabapentin (NEURONTIN) 100 MG capsule Take 100 mg by mouth 3 (three) times daily.   12/04/2014 at Unknown time  . levalbuterol (XOPENEX) 0.63 MG/3ML nebulizer solution Take 3 mLs by nebulization every 6 (six) hours as needed for wheezing or shortness of breath.    unknown  . levothyroxine (SYNTHROID, LEVOTHROID) 112 MCG tablet Take 112 mcg by mouth daily before breakfast.   12/04/2014 at Unknown time  . lidocaine (XYLOCAINE) 2 % solution Use as directed 10 mLs in the mouth or throat 3 (three) times daily. 100 mL 0 12/04/2014 at Unknown time  . niacin 500 MG tablet Take 500 mg by  mouth at bedtime.    12/09/2014 at Unknown time  . ondansetron (ZOFRAN ODT) 8 MG disintegrating tablet Take 1 tablet (8 mg total) by mouth every 8 (eight) hours as needed. 20m ODT q4 hours prn nausea 12 tablet 0 unknown  . potassium chloride SA (K-DUR,KLOR-CON) 20 MEQ tablet Take 20 mEq by mouth 2 (two) times daily.   12/04/2014 at Unknown time  . pravastatin (PRAVACHOL) 80 MG tablet Take 1 tablet (80 mg total) by mouth daily. 90 tablet 3 12/25/2014 at Unknown time  . predniSONE (DELTASONE) 20 MG tablet Take 421mpo daily for 3 more days (Patient taking differently: Take 40 mg by mouth 3 (three) times daily. )   12/04/2014 at Unknown time  . ranitidine (ZANTAC) 150 MG tablet Take 150 mg by mouth at bedtime.   12/09/2014 at Unknown time  . SPIRIVA HANDIHALER 18 MCG inhalation capsule Take 18 mcg by mouth daily.   12/04/2014 at Unknown time  . ZETIA 10 MG tablet TAKE ONE TABLET BY MOUTH ONCE DAILY. 30 tablet 11 12/04/2014 at Unknown time   Scheduled: . antiseptic oral rinse  7 mL Mouth Rinse QID  . carvedilol  25 mg Per Tube BID WC  . chlorhexidine  15 mL Mouth Rinse BID  . enoxaparin (LOVENOX) injection  40 mg Subcutaneous Q24H  . famotidine (PEPCID) IV  20 mg Intravenous Q12H  . fluconazole (DIFLUCAN) IV  100 mg Intravenous Q24H  . insulin aspart  0-15 Units Subcutaneous 6 times per day  . ipratropium-albuterol  3 mL Nebulization Q4H  . levothyroxine  112 mcg Per Tube QAC breakfast  . methylPREDNISolone (SOLU-MEDROL) injection  40 mg Intravenous Q12H  . piperacillin-tazobactam (ZOSYN)  IV  3.375 g Intravenous Q8H  . polyethylene glycol  17 g Per Tube Daily  . potassium chloride  20 mEq Per Tube BID  . vancomycin  1,250 mg Intravenous Q12H  Continuous: . sodium chloride 10 mL/hr at 12/07/14 0605  . sodium chloride 50 mL/hr at 12/08/14 0600  . fentaNYL infusion INTRAVENOUS 100 mcg/hr (12/08/14 0600)   VUY:EBXIDHWYS, fentaNYL, midazolam, midazolam, ondansetron **OR** ondansetron (ZOFRAN)  IV  Assesment: She was admitted with acute hypoxic respiratory failure. She was initially not requiring ventilator support that it appears that she had acute aspiration and required ventilator support at that time. She has been having a fairly weak cough effort prior to intubation. We will need to be cautious with extubating her and make sure that she can provide a reasonable cough. Principal Problem:   Acute respiratory failure with hypoxia Active Problems:   Essential hypertension   DM type 2 (diabetes mellitus, type 2)   Aspiration pneumonia   Thyroid mass    Plan: I think she probably will be able to be extubated today but we need to be cautious as noted    LOS: 4 days   Samantha [name] L 12/08/2014, 8:03 AM

## 2014-12-08 NOTE — Progress Notes (Signed)
Nutrition Follow-up   INTERVENTION: Recommend if unable to extubate in a.m. Initiate Vital High Protein @ 20 ml/hr via NGT and increase by 10 ml every 4 hours to goal rate of 50 ml/hr.   Add 30 ml Prostat BID   Tube feeding regimen provides 1400 kcal, 135 gr grams of protein, and  ml of H2O.     RD will continue to follow  NUTRITION DIAGNOSIS: Inadequate oral intake related to inability to eat as evidenced by vent status, NPO     GOAL: Pt to advance diet (Dysphagia 3 with nectar-thick liquids) and meet 100% of protein and 75% energy needs daily.    MONITOR: Respiratory status and diet advancement as appropriate    REASON FOR ASSESSMENT:   Ventilator     ASSESSMENT: Patient is an obese 69 yo female who is currently intubated on ventilator support. She is expected to be extubated next 24-48 hrs. She is awake. Severe weight loss over the past year per hospital records. Hx of CHF and thyroid mass may be contributing??  Pt was unable to wean today. Maybe in the morning?? Intubated on 7/6 @ 1930.  MV: 8.2 L/min  Temp (24hrs), Avg:97.6 F (36.4 C), Min:97.4 F (36.3 C), Max:98.1 F (36.7 C)  Height:  Ht Readings from Last 1 Encounters:  12/08/14 5\' 11"  (1.803 m)    Weight:  Wt Readings from Last 1 Encounters:  12/08/14 239 lb 6.7 oz (108.6 kg)    Ideal Body Weight:  70.45 kg  Wt Readings from Last 10 Encounters:  12/08/14 239 lb 6.7 oz (108.6 kg)  11/30/14 242 lb 9.6 oz (110.043 kg)  11/17/14 260 lb (117.935 kg)  10/18/14 260 lb (117.935 kg)  09/15/14 264 lb (119.75 kg)  07/24/14 274 lb (124.286 kg)  03/18/14 289 lb 3.9 oz (131.2 kg)  02/08/14 292 lb (132.45 kg)  02/03/14 292 lb (132.45 kg)  01/04/14 296 lb 1.6 oz (134.31 kg)    BMI:  Body mass index is 33.41 kg/(m^2). Obesity class I  Estimated Nutritional Needs:  Kcal:  1795 (Hypockaloric goal 1440)  Protein:  140 gr  Fluid:  >1.8 liters daily  Skin:   intact  Diet Order:  Diet NPO time  specified  EDUCATION NEEDS: none at this time    Intake/Output Summary (Last 24 hours) at 12/08/14 1529 Last data filed at 12/08/14 1011  Gross per 24 hour  Intake 1657.5 ml  Output   1350 ml  Net  307.5 ml    Last BM:  7/7-watery stool  Royann ShiversLynn Zev Blue MS,RD,CSG,LDN Office: [phone] Pager: [phone]

## 2014-12-08 NOTE — Progress Notes (Signed)
Shift Summary- Walked into to room to find, day shift nurse at the bedside, patient sitting upright in bed with 100% NRB, patient pale, awake, cyanotic around the lips, O2 sats in the 60's; heart rate brady; code called at 1910, Dr Manus Gunningancour from the ED in to intubate the patient at [id number], Dr Onalee Huaavid at the bedside, [facility] in the room monitoring pateint condition, patient given 20mg  of Etomidate and 150mg  of Succ, intubated with 7.5 ETT 23cm at the lip, good color change on the ETCo2 detector, bilateral breath sounds on asculation; second IV was started in the right FA after several attempts; NGT inserted to the right nare with ease placed to LIWS and secured with tape, patient tolerated insertion well, portable chest x-ray completed which verified placement of ETT and NGT; patient started on a Fentanyl drip and titrated per protocol; labs drawn; husband in the room at the bedside talking with Dr Onalee Huaavid about the wishes of the patient; patient to remain full code; patient was given a bath, sacral dressing for protection placed, foley placed per order; family back in the room at the bedside; vital signs stable; will continue to monitor and document any changes in patient condition

## 2014-12-08 NOTE — Clinical Social Work Note (Signed)
CSW updated PNC on pt. Will remain in hospital through weekend per MD. Pt will require PT evaluation when stable for re-authorization for SNF. CSW to continue to follow.  Derenda FennelKara Hektor [place]uston, LCSW [phone]

## 2014-12-08 NOTE — Progress Notes (Signed)
PROGRESS NOTE  Samantha RubensteinBarbara A [name] ZOX:[id number] DOB: [birth year] DOA: 12/08/2014 PCP: Colette RibasGOLDING, Samantha CABOT, MD  Summary: 69 year old woman with history of COPD, dysphagia presented with shortness of breath and hypoxic respiratory failure requiring high flow oxygen. CT chest revealed aspiration pneumonia. Treated with IV abx with some improvement. Subsequently decompensated with severe hypoxia and was intubated.  Assessment/Plan: 1. Acute hypoxic, hypercapnic respiratory failure with respiratory acidosis secondary to aspiration pneumonia. Intubated 7/6, suspect aspiration event. Appears better today.  2. Aspiration pneumonia. Afebrile. Leukocytosis has resolved. CT negative for PE. Repeat CXR without significant change.  3. COPD, appears stable. 4. Chronic dysphagia. Last admission recommendations per speech therapy: Dysphagia 3 diet with nectar thick liquids, no straws. 5. Hypothyroidism. Solitary 2.9 cm right thyroid mass found on ultrasound. Suggest outpatient evaluation. TSH pending. Consider outpatient FNA. 6. DM. Stable.    Overall improved. Appreciate pulmonology. Hopefully can extubate today.  Continue IV abx.  Will repeat ST eval when extubated.  F/u TSH  Will discuss with husband today, last night when I spoke to him he did not express desire for PMT. Will revisit today.   Code Status: full code DVT prophylaxis: Lovenox Family Communication: none present. Patient alert and understands plan again 7/7  Disposition Plan: hopefully return to [facility]  Samantha Sacksaniel Arshan Jabs, MD  [facility]  Pager [phone] If 7PM-7AM, please contact night-coverage at [email or web address], password [email or web address] 12/08/2014, 8:25 AM  LOS: 4 days   Consultants:    Procedures:  ETT 7/6 >>   Antibiotics:  Vancomycin 7/4 >>  Zosyn 7/4 >>  HPI/Subjective: Possible extubation today.  Communicates with pen/paper and gestures. No pain. Breathing ok. No n/v.  Objective: Filed Vitals:   12/08/14 0615  12/08/14 0630 12/08/14 0700 12/08/14 0810  BP: 147/77 154/69 134/66   Pulse: 65 60 56   Temp:    97.5 F (36.4 C)  TempSrc:    Axillary  Resp: 16 16 16    Height:      Weight:      SpO2: 100% 98% 95%     Intake/Output Summary (Last 24 hours) at 12/08/14 0825 Last data filed at 12/08/14 0600  Gross per 24 hour  Intake 1657.5 ml  Output   1075 ml  Net  582.5 ml     Filed Weights   12/05/14 0500 12/07/14 0500 12/08/14 0500  Weight: 105.7 kg (233 lb 0.4 oz) 108.5 kg (239 lb 3.2 oz) 108.6 kg (239 lb 6.7 oz)    Exam:    Afebrile, vital signs are stable General:  Appears comfortable, calm. Intubated.  Eyes: PERRL, normal lids  ENT: grossly normal hearing, lips Cardiovascular: Regular rate and rhythm, no murmur, rub or gallop. No lower extremity edema. Respiratory: Clear to auscultation bilaterally, no wheezes, rales or rhonchi. Normal respiratory effort. FIO2 40%. PEEP 5. Psychiatric: grossly normal mood and affect  New data reviewed:  ABG 7.5/33/90    basic metabolic panel unremarkable  Leukocytosis has resolved. Hemoglobin is stable 11.2  Pertinent data since admission:  Hemoglobin A1c 6.8  CT angiogram of the chest 7/4: No PE. Left lower lobe airspace opacification concerning for aspiration. Right thyroid lobe mass seen.  Pending data:  Blood cultures  Scheduled Meds: . antiseptic oral rinse  7 mL Mouth Rinse QID  . carvedilol  25 mg Per Tube BID WC  . chlorhexidine  15 mL Mouth Rinse BID  . enoxaparin (LOVENOX) injection  40 mg Subcutaneous Q24H  . famotidine (PEPCID) IV  20 mg Intravenous Q12H  .  fluconazole (DIFLUCAN) IV  100 mg Intravenous Q24H  . insulin aspart  0-15 Units Subcutaneous 6 times per day  . ipratropium-albuterol  3 mL Nebulization Q4H  . levothyroxine  112 mcg Per Tube QAC breakfast  . methylPREDNISolone (SOLU-MEDROL) injection  40 mg Intravenous Q12H  . piperacillin-tazobactam (ZOSYN)  IV  3.375 g Intravenous Q8H  . polyethylene glycol   17 g Per Tube Daily  . potassium chloride  20 mEq Per Tube BID  . vancomycin  1,250 mg Intravenous Q12H   Continuous Infusions: . sodium chloride 10 mL/hr at 12/07/14 0605  . sodium chloride 50 mL/hr at 12/08/14 0600  . fentaNYL infusion INTRAVENOUS 100 mcg/hr (12/08/14 0600)    Principal Problem:   Acute respiratory failure with hypoxia Active Problems:   Essential hypertension   DM type 2 (diabetes mellitus, type 2)   Aspiration pneumonia   Thyroid mass   Time spent 20 minutes

## 2014-12-08 NOTE — Care Management (Signed)
Important Message  Patient Details  Name: Lesly RubensteinBarbara A Laminack MRN: [id number] Date of Birth: 1945-12-15   Medicare Important Message Given:  Yes-third notification given    Malcolm MetroChildress, Berneice Zettlemoyer Demske, RN 12/08/2014, 12:55 PM

## 2014-12-08 NOTE — Progress Notes (Signed)
ANTIBIOTIC CONSULT NOTE - FOLLOW UP  Pharmacy Consult for Vancomycin and Zosyn  Indication: pneumonia  Allergies  Allergen Reactions  . Clarithromycin Anaphylaxis  . Levofloxacin Anaphylaxis  . Metronidazole Anaphylaxis  . Sulfonamide Derivatives Anaphylaxis  . Amoxicillin-Pot Clavulanate     Heart palpitations  . Augmentin [Amoxicillin-Pot Clavulanate] Other (See Comments)    Heart palpatation  . Clindamycin/Lincomycin Diarrhea  . Lansoprazole     SOB, heart racing when took Prevpac  . Metronidazole   . Sulfamethoxazole-Trimethoprim Swelling    Patient Measurements: Height:  (180.3 cm) Weight: 239 lb 6.7 oz (108.6 kg) IBW/kg (Calculated) : 70.8   Vital Signs: Temp: 97.5 F (36.4 C) (07/08 0810) Temp Source: Axillary (07/08 0810) BP: 128/66 mmHg (07/08 1000) Pulse Rate: 63 (07/08 1000) Intake/Output from previous day: 07/07 0701 - 07/08 0700 In: 1657.5 [I.V.:1197.5; NG/GT:40; IV Piggyback:300] Out: 1075 [Urine:1000; Emesis/NG output:75] Intake/Output from this shift: Total I/O In: 150 [IV Piggyback:150] Out: 275 [Urine:275]  Labs:  Recent Labs  12/06/14 2009 12/07/14 0514 12/08/14 0505 12/08/14 0520  WBC 12.4* 14.1*  --  7.0  HGB 12.1 11.5*  --  11.2*  PLT 231 239  --  208  CREATININE 1.14* 1.14* 1.19*  --    Estimated Creatinine Clearance: 61.4 mL/min (by C-G formula based on Cr of 1.19).  Recent Labs  12/08/14 0505  VANCOTROUGH 57*     Microbiology: Recent Results (from the past 720 hour(s))  Urine culture     Status: None   Collection Time: 11/17/14  6:35 AM  Result Value Ref Range Status   Specimen Description URINE, CATHETERIZED  Final   Special Requests NONE  Final   Culture   Final    NO GROWTH 2 DAYS Performed at [facility]    Report Status 11/19/2014 FINAL  Final  Blood culture (routine x 2)     Status: None (Preliminary result)   Collection Time: 12/04/14  4:00 AM  Result Value Ref Range Status   Specimen  Description RIGHT ANTECUBITAL  Final   Special Requests BOTTLES DRAWN AEROBIC ONLY 7CC  Final   Culture NO GROWTH 3 DAYS  Final   Report Status PENDING  Incomplete  Blood culture (routine x 2)     Status: None (Preliminary result)   Collection Time: 12/04/14  4:16 AM  Result Value Ref Range Status   Specimen Description RIGHT ANTECUBITAL  Final   Special Requests BOTTLES DRAWN AEROBIC ONLY 6CC  Final   Culture NO GROWTH 3 DAYS  Final   Report Status PENDING  Incomplete  MRSA PCR Screening     Status: None   Collection Time: 12/04/14  4:24 AM  Result Value Ref Range Status   MRSA by PCR NEGATIVE NEGATIVE Final    Comment:        The GeneXpert MRSA Assay (FDA approved for NASAL specimens only), is one component of a comprehensive MRSA colonization surveillance program. It is not intended to diagnose MRSA infection nor to guide or monitor treatment for MRSA infections.     Anti-infectives    Start     Dose/Rate Route Frequency Ordered Stop   12/07/14 1000  fluconazole (DIFLUCAN) IVPB 100 mg     100 mg 50 mL/hr over 60 Minutes Intravenous Every 24 hours 12/07/14 0855 12/08/14 1106   12/04/14 1800  vancomycin (VANCOCIN) 1,250 mg in sodium chloride 0.9 % 250 mL IVPB  Status:  Discontinued     1,250 mg 166.7 mL/hr over 90 Minutes Intravenous  Every 12 hours 12/04/14 0816 12/08/14 1148   12/04/14 1400  piperacillin-tazobactam (ZOSYN) IVPB 3.375 g     3.375 g 12.5 mL/hr over 240 Minutes Intravenous Every 8 hours 12/04/14 0817     12/04/14 1000  fluconazole (DIFLUCAN) tablet 100 mg  Status:  Discontinued     100 mg Oral Daily 12/04/14 0454 12/07/14 0855   12/04/14 0530  vancomycin (VANCOCIN) IVPB 1000 mg/200 mL premix     1,000 mg 200 mL/hr over 60 Minutes Intravenous  Once 12/04/14 0524 12/04/14 0704   12/04/14 0330  clindamycin (CLEOCIN) IVPB 600 mg  Status:  Discontinued     600 mg 100 mL/hr over 30 Minutes Intravenous  Once 12/04/14 0315 12/04/14 0454   12/04/14 0330   vancomycin (VANCOCIN) IVPB 1000 mg/200 mL premix     1,000 mg 200 mL/hr over 60 Minutes Intravenous  Once 12/04/14 0315 12/04/14 0530   12/04/14 0330  piperacillin-tazobactam (ZOSYN) IVPB 3.375 g     3.375 g 100 mL/hr over 30 Minutes Intravenous  Once 12/04/14 0315 12/04/14 0500     Assessment: Okay for Protocol, trough above goal, renal function stable.  Acute hypoxic, hypercapnic respiratory failure with respiratory acidosis secondary to aspiration pneumonia. Intubated 7/6, suspect aspiration event.  Goal of Therapy:  Vancomycin trough level 15-20 mcg/ml  Plan:  Continue Zosyn 3.375gm IV every 8 hours. Follow-up micro data, labs, vitals.  Stop standing Vancomycin order. Random level in AM and reduce when level approaches goal range. Measure antibiotic drug levels at steady state Follow up culture results  Mady GemmaHayes, Aviyanna Colbaugh R 12/08/2014,11:49 AM

## 2014-12-09 ENCOUNTER — Inpatient Hospital Stay (HOSPITAL_COMMUNITY): Payer: Commercial Managed Care - HMO

## 2014-12-09 LAB — GLUCOSE, CAPILLARY
GLUCOSE-CAPILLARY: 147 mg/dL — AB (ref 65–99)
GLUCOSE-CAPILLARY: 159 mg/dL — AB (ref 65–99)
Glucose-Capillary: 171 mg/dL — ABNORMAL HIGH (ref 65–99)
Glucose-Capillary: 144 mg/dL — ABNORMAL HIGH (ref 65–99)
Glucose-Capillary: 141 mg/dL — ABNORMAL HIGH (ref 65–99)
Glucose-Capillary: 144 mg/dL — ABNORMAL HIGH (ref 65–99)

## 2014-12-09 LAB — CULTURE, BLOOD (ROUTINE X 2)
CULTURE: NO GROWTH
Culture: NO GROWTH

## 2014-12-09 LAB — VANCOMYCIN, RANDOM: Vancomycin Rm: 36 ug/mL

## 2014-12-09 LAB — CBC
HCT: 41.6 % (ref 36.0–46.0)
HEMOGLOBIN: 13.1 g/dL (ref 12.0–15.0)
MCH: 27.3 pg (ref 26.0–34.0)
MCHC: 31.5 g/dL (ref 30.0–36.0)
MCV: 86.8 fL (ref 78.0–100.0)
Platelets: 169 10*3/uL (ref 150–400)
RBC: 4.79 MIL/uL (ref 3.87–5.11)
RDW: 15.5 % (ref 11.5–15.5)
WBC: 12.5 10*3/uL — ABNORMAL HIGH (ref 4.0–10.5)

## 2014-12-09 LAB — BLOOD GAS, ARTERIAL
Acid-Base Excess: 1.1 mmol/L (ref 0.0–2.0)
BICARBONATE: 24.8 meq/L — AB (ref 20.0–24.0)
Drawn by: 22223
FIO2: 40 %
MECHVT: 500 mL
O2 Saturation: 97.7 %
PCO2 ART: 36.6 mmHg (ref 35.0–45.0)
PEEP: 5 cmH2O
PH ART: 7.446 (ref 7.350–7.450)
Patient temperature: 37
RATE: 16 resp/min
TCO2: 22.3 mmol/L (ref 0–100)
pO2, Arterial: 102 mmHg — ABNORMAL HIGH (ref 80.0–100.0)

## 2014-12-09 LAB — TSH: TSH: 0.386 u[IU]/mL (ref 0.350–4.500)

## 2014-12-09 MED ORDER — VITAL HIGH PROTEIN PO LIQD
1000.0000 mL | ORAL | Status: DC
  Administered 2014-12-09 – 2014-12-10 (×4): 1000 mL
  Filled 2014-12-09 (×3): qty 1000

## 2014-12-09 NOTE — Progress Notes (Signed)
Subjective: She was intubated about 48 hours ago. She is hopeful of being extubated. Yesterday she was able to wean but seemed to tire developed tachycardia and some respiratory distress and had to be started back on full ventilator support. She is awake and alert and able to respond to questions and requests  Objective: Vital signs in last 24 hours: Temp:  [97.3 F (36.3 C)-98.1 F (36.7 C)] 97.4 F (36.3 C) (07/09 0809) Pulse Rate:  [38-101] 56 (07/09 0809) Resp:  [13-23] 16 (07/09 0809) BP: (76-163)/(44-120) 146/72 mmHg (07/09 0800) SpO2:  [93 %-100 %] 98 % (07/09 0809) FiO2 (%):  [40 %-60 %] 40 % (07/09 0809) Weight:  [108.3 kg (238 lb 12.1 oz)] 108.3 kg (238 lb 12.1 oz) (07/09 0400) Weight change: -0.3 kg (-10.6 oz) Last BM Date: 12/06/14  Intake/Output from previous day: 07/08 0701 - 07/09 0700 In: 1872.4 [I.V.:1332.4; NG/GT:190; IV Piggyback:350] Out: 1225 [Urine:1225]  PHYSICAL EXAM General appearance: alert, cooperative and Intubated on mechanical ventilation Resp: rhonchi bilaterally Cardio: regular rate and rhythm, S1, S2 normal, no murmur, click, rub or gallop GI: soft, non-tender; bowel sounds normal; no masses,  no organomegaly Extremities: extremities normal, atraumatic, no cyanosis or edema  Lab Results:  Results for orders placed or performed during the hospital encounter of 12/13/2014 (from the past 48 hour(s))  Glucose, capillary     Status: Abnormal   Collection Time: 12/07/14 11:58 AM  Result Value Ref Range   Glucose-Capillary 131 (H) 65 - 99 mg/dL   Comment 1 Notify RN    Comment 2 Document in Chart   Glucose, capillary     Status: Abnormal   Collection Time: 12/07/14  4:41 PM  Result Value Ref Range   Glucose-Capillary 187 (H) 65 - 99 mg/dL   Comment 1 Notify RN    Comment 2 Document in Chart   Glucose, capillary     Status: Abnormal   Collection Time: 12/07/14  7:29 PM  Result Value Ref Range   Glucose-Capillary 189 (H) 65 - 99 mg/dL  Glucose,  capillary     Status: Abnormal   Collection Time: 12/08/14 12:37 AM  Result Value Ref Range   Glucose-Capillary 189 (H) 65 - 99 mg/dL  Glucose, capillary     Status: Abnormal   Collection Time: 12/08/14  4:30 AM  Result Value Ref Range   Glucose-Capillary 161 (H) 65 - 99 mg/dL  Blood gas, arterial     Status: Abnormal   Collection Time: 12/08/14  5:03 AM  Result Value Ref Range   FIO2 0.40 %   Delivery systems VENTILATOR    Mode PRESSURE REGULATED VOLUME CONTROL    VT 500 mL   Rate 16 resp/min   Peep/cpap 5.0 cm H20   pH, Arterial 7.501 (H) 7.350 - 7.450   pCO2 arterial 33.7 ([name]) 35.0 - 45.0 mmHg   pO2, Arterial 90.2 80.0 - 100.0 mmHg   Bicarbonate 26.1 (H) 20.0 - 24.0 mEq/[name]   TCO2 23.3 0 - 100 mmol/[name]   Acid-Base Excess 3.0 (H) 0.0 - 2.0 mmol/[name]   O2 Saturation 97.5 %   Patient temperature 37.0    Collection site RIGHT RADIAL    Drawn by [id number]    Sample type ARTERIAL DRAW    Allens test (pass/fail) PASS PASS  Vancomycin, trough     Status: Abnormal   Collection Time: 12/08/14  5:05 AM  Result Value Ref Range   Vancomycin Tr 57 (HH) 10.0 - 20.0 ug/mL    Comment:  CRITICAL RESULT CALLED TO, READ BACK BY AND VERIFIED WITH: DANIEL,J AT 6:20AM ON 12/08/14 BY [facility]   Basic metabolic panel     Status: Abnormal   Collection Time: 12/08/14  5:05 AM  Result Value Ref Range   Sodium 145 135 - 145 mmol/[name]   Potassium 4.4 3.5 - 5.1 mmol/[name]   Chloride 111 101 - 111 mmol/[name]   CO2 26 22 - 32 mmol/[name]   Glucose, Bld 177 (H) 65 - 99 mg/dL   BUN 39 (H) 6 - 20 mg/dL   Creatinine, Ser 1.19 (H) 0.44 - 1.00 mg/dL   Calcium 8.1 ([name]) 8.9 - 10.3 mg/dL   GFR calc non Af Amer 46 ([name]) >60 mL/min   GFR calc Af Amer 53 ([name]) >60 mL/min    Comment: (NOTE) The eGFR has been calculated using the CKD EPI equation. This calculation has not been validated in all clinical situations. eGFR's persistently <60 mL/min signify possible Chronic Kidney Disease.    Anion gap 8 5 - 15  CBC with  Differential/Platelet     Status: Abnormal   Collection Time: 12/08/14  5:20 AM  Result Value Ref Range   WBC 7.0 4.0 - 10.5 K/uL   RBC 4.18 3.87 - 5.11 MIL/uL   Hemoglobin 11.2 ([name]) 12.0 - 15.0 g/dL   HCT 35.9 ([name]) 36.0 - 46.0 %   MCV 85.9 78.0 - 100.0 fL   MCH 26.8 26.0 - 34.0 pg   MCHC 31.2 30.0 - 36.0 g/dL   RDW 15.5 11.5 - 15.5 %   Platelets 208 150 - 400 K/uL   Neutrophils Relative % 84 (H) 43 - 77 %   Neutro Abs 5.9 1.7 - 7.7 K/uL   Lymphocytes Relative 11 ([name]) 12 - 46 %   Lymphs Abs 0.8 0.7 - 4.0 K/uL   Monocytes Relative 5 3 - 12 %   Monocytes Absolute 0.4 0.1 - 1.0 K/uL   Eosinophils Relative 0 0 - 5 %   Eosinophils Absolute 0.0 0.0 - 0.7 K/uL   Basophils Relative 0 0 - 1 %   Basophils Absolute 0.0 0.0 - 0.1 K/uL  Glucose, capillary     Status: Abnormal   Collection Time: 12/08/14  7:30 AM  Result Value Ref Range   Glucose-Capillary 148 (H) 65 - 99 mg/dL   Comment 1 Notify RN   Glucose, capillary     Status: Abnormal   Collection Time: 12/08/14 11:38 AM  Result Value Ref Range   Glucose-Capillary 157 (H) 65 - 99 mg/dL   Comment 1 Notify RN   Glucose, capillary     Status: Abnormal   Collection Time: 12/08/14  4:37 PM  Result Value Ref Range   Glucose-Capillary 177 (H) 65 - 99 mg/dL   Comment 1 Notify RN    Comment 2 Document in Chart   Glucose, capillary     Status: Abnormal   Collection Time: 12/08/14  8:06 PM  Result Value Ref Range   Glucose-Capillary 162 (H) 65 - 99 mg/dL   Comment 1 Notify RN   Glucose, capillary     Status: Abnormal   Collection Time: 12/08/14 11:02 PM  Result Value Ref Range   Glucose-Capillary 159 (H) 65 - 99 mg/dL   Comment 1 Notify RN   Glucose, capillary     Status: Abnormal   Collection Time: 12/09/14  3:05 AM  Result Value Ref Range   Glucose-Capillary 171 (H) 65 - 99 mg/dL   Comment 1 Notify RN  Vancomycin, random     Status: None   Collection Time: 12/09/14  4:52 AM  Result Value Ref Range   Vancomycin Rm 36 ug/mL     Comment:        Random Vancomycin therapeutic range is dependent on dosage and time of specimen collection. A peak range is 20.0-40.0 ug/mL A trough range is 5.0-15.0 ug/mL          Blood gas, arterial     Status: Abnormal   Collection Time: 12/09/14  5:05 AM  Result Value Ref Range   FIO2 40.00 %   Delivery systems VENTILATOR    Mode PRESSURE REGULATED VOLUME CONTROL    VT 500 mL   Rate 16 resp/min   Peep/cpap 5.0 cm H20   pH, Arterial 7.446 7.350 - 7.450   pCO2 arterial 36.6 35.0 - 45.0 mmHg   pO2, Arterial 102 (H) 80.0 - 100.0 mmHg   Bicarbonate 24.8 (H) 20.0 - 24.0 mEq/[name]   TCO2 22.3 0 - 100 mmol/[name]   Acid-Base Excess 1.1 0.0 - 2.0 mmol/[name]   O2 Saturation 97.7 %   Patient temperature 37.0    Collection site LEFT RADIAL    Drawn by [id number]    Sample type ARTERIAL    Allens test (pass/fail) PASS PASS  Glucose, capillary     Status: Abnormal   Collection Time: 12/09/14  7:50 AM  Result Value Ref Range   Glucose-Capillary 144 (H) 65 - 99 mg/dL   Comment 1 Notify RN     ABGS  Recent Labs  12/09/14 0505  PHART 7.446  PO2ART 102*  TCO2 22.3  HCO3 24.8*   CULTURES Recent Results (from the past 240 hour(s))  Blood culture (routine x 2)     Status: None   Collection Time: 12/04/14  4:00 AM  Result Value Ref Range Status   Specimen Description RIGHT ANTECUBITAL  Final   Special Requests BOTTLES DRAWN AEROBIC ONLY [place]  Final   Culture NO GROWTH 5 DAYS  Final   Report Status 12/09/2014 FINAL  Final  Blood culture (routine x 2)     Status: None   Collection Time: 12/04/14  4:16 AM  Result Value Ref Range Status   Specimen Description RIGHT ANTECUBITAL  Final   Special Requests BOTTLES DRAWN AEROBIC ONLY 6CC  Final   Culture NO GROWTH 5 DAYS  Final   Report Status 12/09/2014 FINAL  Final  MRSA PCR Screening     Status: None   Collection Time: 12/04/14  4:24 AM  Result Value Ref Range Status   MRSA by PCR NEGATIVE NEGATIVE Final    Comment:        The GeneXpert MRSA  Assay (FDA approved for NASAL specimens only), is one component of a comprehensive MRSA colonization surveillance program. It is not intended to diagnose MRSA infection nor to guide or monitor treatment for MRSA infections.    Studies/Results: Dg Chest Port 1 View  12/08/2014   CLINICAL DATA:  Routine follow-up.  Ventilator patient.  EXAM: PORTABLE CHEST - 1 VIEW  COMPARISON:  12/06/2014 radiographs.  CT 12/04/2014.  FINDINGS: 0612 hr. Endotracheal tube tip remains the midtrachea. Nasogastric tube projects below the diaphragm, tip not visualized. Overall pulmonary aeration has improved with mild residual left greater than right basilar atelectasis. There is no edema, pleural effusion or pneumothorax. The heart size and mediastinal contours are stable.  IMPRESSION: Interval improved aeration of the lung bases. Stable support system.   Electronically Signed   By:  Richardean Sale M.D.   On: 12/08/2014 07:51    Medications:  Prior to Admission:  Prescriptions prior to admission  Medication Sig Dispense Refill Last Dose  . Alum & Mag Hydroxide-Simeth (MAGIC MOUTHWASH) SOLN Take 10 mLs by mouth 3 (three) times daily.  0 12/04/2014 at Unknown time  . aspirin 81 MG tablet Take 162 mg by mouth daily.    12/04/2014 at Unknown time  . BREO ELLIPTA 100-25 MCG/INH AEPB Inhale 1 puff into the lungs daily.   12/04/2014 at Unknown time  . carvedilol (COREG) 25 MG tablet Take 1 tablet (25 mg total) by mouth 2 (two) times daily with a meal. 60 tablet 11 12/04/2014 at 0900  . Coenzyme Q10 (CO Q 10) 100 MG CAPS Take 100 mg by mouth 2 (two) times daily.    12/04/2014 at Unknown time  . ferrous sulfate 325 (65 FE) MG tablet Take 325 mg by mouth daily.   12/04/2014 at Unknown time  . fluconazole (DIFLUCAN) 100 MG tablet Take 1 tablet (100 mg total) by mouth daily. For 7 days 7 tablet 0 12/04/2014 at Unknown time  . folic acid (FOLVITE) 1 MG tablet Take 1 tablet (1 mg total) by mouth daily. 90 tablet 3 12/04/2014 at Unknown time   . furosemide (LASIX) 40 MG tablet Take 40 mg by mouth daily.    12/04/2014 at Unknown time  . gabapentin (NEURONTIN) 100 MG capsule Take 100 mg by mouth 3 (three) times daily.   12/04/2014 at Unknown time  . levalbuterol (XOPENEX) 0.63 MG/3ML nebulizer solution Take 3 mLs by nebulization every 6 (six) hours as needed for wheezing or shortness of breath.    unknown  . levothyroxine (SYNTHROID, LEVOTHROID) 112 MCG tablet Take 112 mcg by mouth daily before breakfast.   12/04/2014 at Unknown time  . lidocaine (XYLOCAINE) 2 % solution Use as directed 10 mLs in the mouth or throat 3 (three) times daily. 100 mL 0 12/04/2014 at Unknown time  . niacin 500 MG tablet Take 500 mg by mouth at bedtime.    12/30/2014 at Unknown time  . ondansetron (ZOFRAN ODT) 8 MG disintegrating tablet Take 1 tablet (8 mg total) by mouth every 8 (eight) hours as needed. 75m ODT q4 hours prn nausea 12 tablet 0 unknown  . potassium chloride SA (K-DUR,KLOR-CON) 20 MEQ tablet Take 20 mEq by mouth 2 (two) times daily.   12/04/2014 at Unknown time  . pravastatin (PRAVACHOL) 80 MG tablet Take 1 tablet (80 mg total) by mouth daily. 90 tablet 3 12/04/2014 at Unknown time  . predniSONE (DELTASONE) 20 MG tablet Take 425mpo daily for 3 more days (Patient taking differently: Take 40 mg by mouth 3 (three) times daily. )   12/04/2014 at Unknown time  . ranitidine (ZANTAC) 150 MG tablet Take 150 mg by mouth at bedtime.   12/23/2014 at Unknown time  . SPIRIVA HANDIHALER 18 MCG inhalation capsule Take 18 mcg by mouth daily.   12/04/2014 at Unknown time  . ZETIA 10 MG tablet TAKE ONE TABLET BY MOUTH ONCE DAILY. 30 tablet 11 12/04/2014 at Unknown time   Scheduled: . antiseptic oral rinse  7 mL Mouth Rinse QID  . carvedilol  25 mg Per Tube BID WC  . chlorhexidine  15 mL Mouth Rinse BID  . enoxaparin (LOVENOX) injection  40 mg Subcutaneous Q24H  . famotidine (PEPCID) IV  20 mg Intravenous Q12H  . insulin aspart  0-15 Units Subcutaneous 6 times per day  .  ipratropium-albuterol  3 mL Nebulization Q4H  . levothyroxine  112 mcg Per Tube QAC breakfast  . methylPREDNISolone (SOLU-MEDROL) injection  40 mg Intravenous Q12H  . piperacillin-tazobactam (ZOSYN)  IV  3.375 g Intravenous Q8H  . polyethylene glycol  17 g Per Tube Daily  . potassium chloride  20 mEq Per Tube BID   Continuous: . sodium chloride 10 mL/hr at 12/07/14 0605  . sodium chloride 50 mL/hr at 12/09/14 0001  . fentaNYL infusion INTRAVENOUS 75 mcg/hr (12/09/14 0327)   EWY:[id number], fentaNYL, LORazepam, midazolam, midazolam, ondansetron **OR** ondansetron (ZOFRAN) IV  Assesment: She was admitted with acute respiratory failure with hypoxia. She had been doing fairly well and it appears that she aspirated. Even prior to the episode of aspiration she was noted to have a fairly weak cough effort. She is awake and alert and may be ready for weaning/extubation. Principal Problem:   Acute respiratory failure with hypoxia Active Problems:   Essential hypertension   DM type 2 (diabetes mellitus, type 2)   Aspiration pneumonia   Thyroid mass    Plan: Continue treatments. Attempt weaning/extubation today    LOS: 5 days   Samantha [name] 12/09/2014, 9:20 AM

## 2014-12-09 NOTE — Progress Notes (Signed)
ANTIBIOTIC CONSULT NOTE - FOLLOW UP  Pharmacy Consult for Vancomycin and Zosyn  Indication: pneumonia  Allergies  Allergen Reactions  . Clarithromycin Anaphylaxis  . Levofloxacin Anaphylaxis  . Metronidazole Anaphylaxis  . Sulfonamide Derivatives Anaphylaxis  . Amoxicillin-Pot Clavulanate     Heart palpitations  . Augmentin [Amoxicillin-Pot Clavulanate] Other (See Comments)    Heart palpatation  . Clindamycin/Lincomycin Diarrhea  . Lansoprazole     SOB, heart racing when took Prevpac  . Metronidazole   . Sulfamethoxazole-Trimethoprim Swelling    Patient Measurements: Height: 5\' 11"  (180.3 cm) Weight: 238 lb 12.1 oz (108.3 kg) IBW/kg (Calculated) : 70.8   Vital Signs: Temp: 97.4 F (36.3 C) (07/09 0809) Temp Source: Axillary (07/09 0809) BP: 146/72 mmHg (07/09 0800) Pulse Rate: 56 (07/09 0809) Intake/Output from previous day: 07/08 0701 - 07/09 0700 In: 1872.4 [I.V.:1332.4; NG/GT:190; IV Piggyback:350] Out: 1225 [Urine:1225] Intake/Output from this shift:    Labs:  Recent Labs  12/06/14 2009 12/07/14 0514 12/08/14 0505 12/08/14 0520  WBC 12.4* 14.1*  --  7.0  HGB 12.1 11.5*  --  11.2*  PLT 231 239  --  208  CREATININE 1.14* 1.14* 1.19*  --    Estimated Creatinine Clearance: 61.3 mL/min (by C-G formula based on Cr of 1.19).  Recent Labs  12/08/14 0505 12/09/14 0452  VANCOTROUGH 57*  --   VANCORANDOM  --  36     Microbiology: Recent Results (from the past 720 hour(s))  Urine culture     Status: None   Collection Time: 11/17/14  6:35 AM  Result Value Ref Range Status   Specimen Description URINE, CATHETERIZED  Final   Special Requests NONE  Final   Culture   Final    NO GROWTH 2 DAYS [facility]    Report Status 11/19/2014 FINAL  Final  Blood culture (routine x 2)     Status: None   Collection Time: 12/04/14  4:00 AM  Result Value Ref Range Status   Specimen Description RIGHT ANTECUBITAL  Final   Special Requests BOTTLES  DRAWN AEROBIC ONLY 7CC  Final   Culture NO GROWTH 5 DAYS  Final   Report Status 12/09/2014 FINAL  Final  Blood culture (routine x 2)     Status: None   Collection Time: 12/04/14  4:16 AM  Result Value Ref Range Status   Specimen Description RIGHT ANTECUBITAL  Final   Special Requests BOTTLES DRAWN AEROBIC ONLY 6CC  Final   Culture NO GROWTH 5 DAYS  Final   Report Status 12/09/2014 FINAL  Final  MRSA PCR Screening     Status: None   Collection Time: 12/04/14  4:24 AM  Result Value Ref Range Status   MRSA by PCR NEGATIVE NEGATIVE Final    Comment:        The GeneXpert MRSA Assay (FDA approved for NASAL specimens only), is one component of a comprehensive MRSA colonization surveillance program. It is not intended to diagnose MRSA infection nor to guide or monitor treatment for MRSA infections.     Anti-infectives    Start     Dose/Rate Route Frequency Ordered Stop   12/07/14 1000  fluconazole (DIFLUCAN) IVPB 100 mg     100 mg 50 mL/hr over 60 Minutes Intravenous Every 24 hours 12/07/14 0855 12/08/14 1106   12/04/14 1800  vancomycin (VANCOCIN) 1,250 mg in sodium chloride 0.9 % 250 mL IVPB  Status:  Discontinued     1,250 mg 166.7 mL/hr over 90 Minutes Intravenous  Every 12 hours 12/04/14 0816 12/08/14 1148   12/04/14 1400  piperacillin-tazobactam (ZOSYN) IVPB 3.375 g     3.375 g 12.5 mL/hr over 240 Minutes Intravenous Every 8 hours 12/04/14 0817     12/04/14 1000  fluconazole (DIFLUCAN) tablet 100 mg  Status:  Discontinued     100 mg Oral Daily 12/04/14 0454 12/07/14 0855   12/04/14 0530  vancomycin (VANCOCIN) IVPB 1000 mg/200 mL premix     1,000 mg 200 mL/hr over 60 Minutes Intravenous  Once 12/04/14 0524 12/04/14 0704   12/04/14 0330  clindamycin (CLEOCIN) IVPB 600 mg  Status:  Discontinued     600 mg 100 mL/hr over 30 Minutes Intravenous  Once 12/04/14 0315 12/04/14 0454   12/04/14 0330  vancomycin (VANCOCIN) IVPB 1000 mg/200 mL premix     1,000 mg 200 mL/hr over 60  Minutes Intravenous  Once 12/04/14 0315 12/04/14 0530   12/04/14 0330  piperacillin-tazobactam (ZOSYN) IVPB 3.375 g     3.375 g 100 mL/hr over 30 Minutes Intravenous  Once 12/04/14 0315 12/04/14 0500     Assessment: Okay for Protocol, trough still above goal, renal function stable.  Acute hypoxic, hypercapnic respiratory failure with respiratory acidosis secondary to aspiration pneumonia. Intubated 7/6, suspect aspiration event.  Goal of Therapy:  Vancomycin trough level 15-20 mcg/ml  Plan:  Continue Zosyn 3.375gm IV every 8 hours. Follow-up micro data, labs, vitals.  Stop standing Vancomycin order. Random level in AM and reduce when level approaches goal range. Measure antibiotic drug levels at steady state Follow up culture results  Samantha [name], Samantha [name] 12/09/2014,9:17 AM

## 2014-12-09 NOTE — Progress Notes (Addendum)
PROGRESS NOTE  Samantha [name] WGN:[id number] DOB: [birth year] DOA: 12/20/2014 PCP: Colette Ribas, MD  Summary: 69 year old woman with history of COPD, dysphagia presented with shortness of breath and hypoxic respiratory failure requiring high flow oxygen. CT chest revealed aspiration pneumonia. Treated with IV abx with some improvement. Subsequently decompensated with severe hypoxia and was intubated.  Assessment/Plan: 1. Acute hypoxic, hypercapnic respiratory failure with respiratory acidosis secondary to aspiration pneumonia. Intubated 7/6, suspect aspiration event. Overall seems to be improving. FiO2 at 40%. Hopefully can be extubated today. 2. Aspiration pneumonia. Remains afebrile. CT negative for PE. Appears be improving clinically. 3. COPD, appears stable. 4. Chronic dysphagia. Last admission recommendations per speech therapy: Dysphagia 3 diet with nectar thick liquids, no straws. 5. Hypothyroidism. Solitary 2.9 cm right thyroid mass found on ultrasound. TSH pending. Consider outpatient FNA. 6. DM. Remains stable.    Overall appears stable. She is awake and alert and appears calm on the ventilator. She remains afebrile with stable vital signs. ABG is reassuring.  Hopeful for extubation today. Plan to continue antibiotics.  Will repeat ST eval when extubated.  F/u TSH  if unable to extubate will initiate Vital High Protein @ 20 ml/hr via NGT and increase by 10 ml every 4 hours to goal rate of 50 ml/hr  ADDENDUM Unable to extubate today.  Approximately 1530 became hypoxic after attempt at weaning, requiring increased FiO2 CXR showed new opacities throughout left lung.  Hemodynamics stable. Patient was suctioned and quickly improved. Remains awake, alert, somewhat hypertensive, spO2 100% and no distress. Suspect transient phenomenon/mucous plug given quick recovery rather than worsening infection or edema.   Code Status: full code DVT prophylaxis: Lovenox Family  Communication: none present. Patient alert and understands plan again 7/7  Disposition Plan: hopefully return to [facility]  Brendia Sacks, MD  [facility]  Pager [phone] If 7PM-7AM, please contact night-coverage at [email or web address], password [facility] 12/09/2014, 8:09 AM  LOS: 5 days   Consultants:    Procedures:  ETT 7/6 >>   Antibiotics:  Vancomycin 7/4 >>  Zosyn 7/4 >>  HPI/Subjective: No issues overnight.  Patient communicates via gestures. Breathing okay. No pain or nausea.  Objective: Filed Vitals:   12/09/14 0645 12/09/14 0700 12/09/14 0757 12/09/14 0809  BP: 131/67 130/59    Pulse: 55 53    Temp:    97.4 F (36.3 C)  TempSrc:    Axillary  Resp: 16 16    Height:      Weight:      SpO2: 96% 96% 98%     Intake/Output Summary (Last 24 hours) at 12/09/14 0809 Last data filed at 12/09/14 0550  Gross per 24 hour  Intake 1872.38 ml  Output   1225 ml  Net 647.38 ml     Filed Weights   12/07/14 0500 12/08/14 0500 12/09/14 0400  Weight: 108.5 kg (239 lb 3.2 oz) 108.6 kg (239 lb 6.7 oz) 108.3 kg (238 lb 12.1 oz)    Exam:    Afebrile, VSS, FiO2 40% General:  Appears calm and comfortable on ventilator. Eyes: PERRL, normal lids, irises appear grossly normal Cardiovascular: RRR, no m/r/g. No LE edema. Telemetry: SR, no arrhythmias  Respiratory: CTA bilaterally, no w/r/r. Normal respiratory effort. Psychiatric: grossly normal mood and affect, speech fluent and appropriate Neurologic: Moves left lower extremity to command  New data reviewed:  ABG 7.446/36/102  UOP 1225  Pertinent data since admission:  Hemoglobin A1c 6.8  CT angiogram of the chest 7/4: No PE. Left lower  lobe airspace opacification concerning for aspiration. Right thyroid lobe mass seen.  BC no growth, final  Pending data:  TSH  Scheduled Meds: . antiseptic oral rinse  7 mL Mouth Rinse QID  . carvedilol  25 mg Per Tube BID WC  . chlorhexidine  15 mL Mouth Rinse BID  .  enoxaparin (LOVENOX) injection  40 mg Subcutaneous Q24H  . famotidine (PEPCID) IV  20 mg Intravenous Q12H  . insulin aspart  0-15 Units Subcutaneous 6 times per day  . ipratropium-albuterol  3 mL Nebulization Q4H  . levothyroxine  112 mcg Per Tube QAC breakfast  . methylPREDNISolone (SOLU-MEDROL) injection  40 mg Intravenous Q12H  . piperacillin-tazobactam (ZOSYN)  IV  3.375 g Intravenous Q8H  . polyethylene glycol  17 g Per Tube Daily  . potassium chloride  20 mEq Per Tube BID   Continuous Infusions: . sodium chloride 10 mL/hr at 12/07/14 0605  . sodium chloride 50 mL/hr at 12/09/14 0001  . fentaNYL infusion INTRAVENOUS 75 mcg/hr (12/09/14 0327)    Principal Problem:   Acute respiratory failure with hypoxia Active Problems:   Essential hypertension   DM type 2 (diabetes mellitus, type 2)   Aspiration pneumonia   Thyroid mass   Time spent 20 minutes

## 2014-12-10 ENCOUNTER — Inpatient Hospital Stay (HOSPITAL_COMMUNITY): Payer: Commercial Managed Care - HMO

## 2014-12-10 LAB — BLOOD GAS, ARTERIAL
ACID-BASE DEFICIT: 0.3 mmol/L (ref 0.0–2.0)
Bicarbonate: 23.7 mEq/L (ref 20.0–24.0)
Drawn by: 22223
FIO2: 65 %
LHR: 12 {breaths}/min
O2 Saturation: 88.6 %
PATIENT TEMPERATURE: 37
PEEP: 10 cmH2O
PH ART: 7.415 (ref 7.350–7.450)
TCO2: 21.2 mmol/L (ref 0–100)
VT: 560 mL
pCO2 arterial: 37.6 mmHg (ref 35.0–45.0)
pO2, Arterial: 57.2 mmHg — ABNORMAL LOW (ref 80.0–100.0)

## 2014-12-10 LAB — BASIC METABOLIC PANEL
Anion gap: 8 (ref 5–15)
BUN: 40 mg/dL — ABNORMAL HIGH (ref 6–20)
CO2: 23 mmol/L (ref 22–32)
CREATININE: 1.14 mg/dL — AB (ref 0.44–1.00)
Calcium: 8.3 mg/dL — ABNORMAL LOW (ref 8.9–10.3)
Chloride: 113 mmol/L — ABNORMAL HIGH (ref 101–111)
GFR calc non Af Amer: 48 mL/min — ABNORMAL LOW (ref >60)
GFR, EST AFRICAN AMERICAN: 56 mL/min — AB (ref >60)
Glucose, Bld: 215 mg/dL — ABNORMAL HIGH (ref 65–99)
Potassium: 4.6 mmol/L (ref 3.5–5.1)
Sodium: 144 mmol/L (ref 135–145)

## 2014-12-10 LAB — CBC
HEMATOCRIT: 41.5 % (ref 36.0–46.0)
HEMATOCRIT: 42.9 % (ref 36.0–46.0)
Hemoglobin: 13.1 g/dL (ref 12.0–15.0)
Hemoglobin: 13.6 g/dL (ref 12.0–15.0)
MCH: 27.2 pg (ref 26.0–34.0)
MCH: 27.3 pg (ref 26.0–34.0)
MCHC: 31.6 g/dL (ref 30.0–36.0)
MCHC: 31.7 g/dL (ref 30.0–36.0)
MCV: 86.1 fL (ref 78.0–100.0)
MCV: 86.1 fL (ref 78.0–100.0)
Platelets: 180 10*3/uL (ref 150–400)
Platelets: 227 10*3/uL (ref 150–400)
RBC: 4.82 MIL/uL (ref 3.87–5.11)
RBC: 4.98 MIL/uL (ref 3.87–5.11)
RDW: 15.6 % — ABNORMAL HIGH (ref 11.5–15.5)
RDW: 15.6 % — ABNORMAL HIGH (ref 11.5–15.5)
WBC: 20.4 10*3/uL — AB (ref 4.0–10.5)
WBC: 21.8 10*3/uL — ABNORMAL HIGH (ref 4.0–10.5)

## 2014-12-10 LAB — GLUCOSE, CAPILLARY
GLUCOSE-CAPILLARY: 204 mg/dL — AB (ref 65–99)
GLUCOSE-CAPILLARY: 193 mg/dL — AB (ref 65–99)
GLUCOSE-CAPILLARY: 220 mg/dL — AB (ref 65–99)
Glucose-Capillary: 194 mg/dL — ABNORMAL HIGH (ref 65–99)
Glucose-Capillary: 217 mg/dL — ABNORMAL HIGH (ref 65–99)
Glucose-Capillary: 206 mg/dL — ABNORMAL HIGH (ref 65–99)

## 2014-12-10 LAB — VANCOMYCIN, RANDOM: VANCOMYCIN RM: 27 ug/mL

## 2014-12-10 MED ORDER — SODIUM CHLORIDE 0.9 % IJ SOLN: 10.0000 mL | INTRAMUSCULAR | Status: DC | PRN

## 2014-12-10 MED ORDER — FUROSEMIDE 40 MG PO TABS
40.0000 mg | ORAL_TABLET | Freq: Every day | ORAL | Status: DC
  Administered 2014-12-10: 40 mg
  Filled 2014-12-10: qty 1

## 2014-12-10 MED ORDER — FENTANYL CITRATE (PF) 2500 MCG/50ML IJ SOLN
INTRAMUSCULAR | Status: AC
  Filled 2014-12-10: qty 50

## 2014-12-10 MED ORDER — SODIUM CHLORIDE 0.9 % IJ SOLN
10.0000 mL | Freq: Two times a day (BID) | INTRAMUSCULAR | Status: DC
  Administered 2014-12-10: 40 mL
  Administered 2014-12-11: 10 mL
  Administered 2014-12-11: 12 mL
  Administered 2014-12-12 – 2014-12-14 (×3): 10 mL

## 2014-12-10 MED ORDER — CARVEDILOL 12.5 MG PO TABS
12.5000 mg | ORAL_TABLET | Freq: Two times a day (BID) | ORAL | Status: DC
  Administered 2014-12-11: 12.5 mg
  Filled 2014-12-10: qty 1

## 2014-12-10 NOTE — Progress Notes (Signed)
eLink Physician-Brief Progress Note Patient Name: Samantha RubensteinBarbara A [name] DOB: 1946-01-06 MRN: [id number]   Date of Service  12/10/2014  HPI/Events of Note  Pt on Fio2 .80 and Peep 5. Has had L lung collapse, now LLL collapse on CXR.  eICU Interventions  Increased peep 10, titration of fio2 ordered per RT for sats >/= 90%.  Pt may benefit from FOB      Intervention Category Major Interventions: Respiratory failure - evaluation and management  Shan Levansatrick Elkin Belfield 12/10/2014, 12:56 AM

## 2014-12-10 NOTE — Progress Notes (Signed)
Peripherally Inserted Central Catheter/Midline Placement  The IV Nurse has discussed with the patient and/or persons authorized to consent for the patient, the purpose of this procedure and the potential benefits and risks involved with this procedure.  The benefits include less needle sticks, lab draws from the catheter and patient may be discharged home with the catheter.  Risks include, but not limited to, infection, bleeding, blood clot (thrombus formation), and puncture of an artery; nerve damage and irregular heat beat.  Alternatives to this procedure were also discussed.  PICC/Midline Placement Documentation  PICC / Midline Double Lumen 12/10/14 PICC Left Basilic 42 cm 0 cm (Active)  Indication for Insertion or Continuance of Line Limited venous access - need for IV therapy >5 days (PICC only) 12/10/2014  3:16 PM  Exposed Catheter (cm) 0 cm 12/10/2014  3:16 PM  Site Assessment Clean;Dry;Intact 12/10/2014  3:16 PM  Lumen #1 Status Flushed;Capped (Central line);Blood return noted 12/10/2014  3:16 PM  Lumen #2 Status Flushed;Capped (Central line);Blood return noted 12/10/2014  3:16 PM  Dressing Type Transparent 12/10/2014  3:16 PM  Dressing Status Clean;Dry;Intact 12/10/2014  3:16 PM  Line Care Connections checked and tightened 12/10/2014  3:16 PM  Dressing Intervention New dressing 12/10/2014  3:16 PM    PICC originally attempted in R basilic vein but unable to advance to shoulder. Introducer removed and  Pressure applied to site.   Trenton GammonWitherspoon, Flavio Lindroth S 12/10/2014, 3:22 PM

## 2014-12-10 NOTE — Progress Notes (Signed)
PROGRESS NOTE  Samantha [name] ZOX:[id number] DOB: Mar 22, 1946 DOA: 12/08/2014 PCP: Colette Ribas, MD  Summary: 69 year old woman with history of COPD, dysphagia presented with shortness of breath and hypoxic respiratory failure requiring high flow oxygen. CT chest revealed aspiration pneumonia. Treated with IV abx with some improvement. Subsequently decompensated with severe hypoxia and was intubated.  Assessment/Plan: 1. Acute hypoxic, hypoxemic respiratory failure secondary to aspiration pneumonia. Intubated 7/6, suspect aspiration event. S/p spontaneous LLL collapse 7/9 which is improving radiographically. PEEP increased. 2. Aspiration pneumonia. Remains afebrile, WBC without change (on steroids). CT negative for PE. Appears be improving clinically. 3. COPD, stable. On steroids.  4. Chronic dysphagia. Last admission recommendations per speech therapy: Dysphagia 3 diet with nectar thick liquids, no straws. 5. Hypothyroidism. Solitary 2.9 cm right thyroid mass found on ultrasound. TSH pending. Consider outpatient FNA. 6. DM. Stable.    Overall appears to be improving since last evening with improving aeration at the left lung. FiO2 is being decreased. Not a candidate for extubation today. Continue increased PEEP at 10.  However her condition remains critical.  Appreciate pulmonology. For now, patient has deferred bronchoscopy but may need to reconsider this.  Start daily Lasix  Continue tube feeds.  PICC line.  Discussed with multiple family members including husband at bedside.  Code Status: full code DVT prophylaxis: Lovenox Family Communication:  Disposition Plan: hopefully return to [facility]  Brendia Sacks, MD  [facility]  Pager [phone] If 7PM-7AM, please contact night-coverage at [email or web address], password [facility] 12/10/2014, 8:35 AM  LOS: 6 days   Consultants:    Procedures:  ETT 7/6 >>   Antibiotics:  Vancomycin 7/4 >>  Zosyn 7/4  >>  HPI/Subjective: PEEP increased  CC feeling okay. Communicates via gestures. No pain except with ET tube.  Objective: Filed Vitals:   12/10/14 0630 12/10/14 0700 12/10/14 0753 12/10/14 0754  BP: 134/66 136/71    Pulse: 78 80  88  Temp:      TempSrc:      Resp: Height:      Weight:      SpO2: 93% 92% 93% 93%    Intake/Output Summary (Last 24 hours) at 12/10/14 0835 Last data filed at 12/10/14 0504  Gross per 24 hour  Intake 1675.42 ml  Output   1250 ml  Net 425.42 ml     Filed Weights   12/08/14 0500 12/09/14 0400 12/10/14 0600  Weight: 108.6 kg (239 lb 6.7 oz) 108.3 kg (238 lb 12.1 oz) 108.9 kg (240 lb 1.3 oz)    Exam:    Afebrile, VSS, FiO2 65%, SpO2 93% General:  Appears comfortable, calm, sitting up in bed. Eyes: PERRL, normal lids ENT: grossly normal hearing, lips Cardiovascular: Regular rate and rhythm, no murmur, rub or gallop. 1-2+ bilateral lower extremity edema. Telemetry: Sinus rhythm, no arrhythmias  Respiratory: Clear to auscultation bilaterally, no wheezes, rales or rhonchi. Normal respiratory effort. Musculoskeletal: grossly unchanged Psychiatric: grossly normal mood and affect Neurologic: grossly unchanged  New data reviewed:  UOP 1250  CXR 7/9 1815: Slight improved left upper lobe lung aeration. Independently reviewed, I concur  CXR 7/10: continuing improvement LUL aeration, my interpretation  ABG 7.4/37/57  BMP stable  WBC without sig change, 20.4. Hgb stable 13.1  Pertinent data since admission:  Hemoglobin A1c 6.8  CT angiogram of the chest 7/4: No PE. Left lower lobe airspace opacification concerning for aspiration. Right thyroid lobe mass seen.  BC no growth, final  Pending data:  TSH  Scheduled Meds: . antiseptic oral rinse  7 mL Mouth Rinse QID  . carvedilol  25 mg Per Tube BID WC  . chlorhexidine  15 mL Mouth Rinse BID  . famotidine (PEPCID) IV  20 mg Intravenous Q12H  . feeding supplement (VITAL HIGH  PROTEIN)  1,000 mL Per Tube Q24H  . insulin aspart  0-15 Units Subcutaneous 6 times per day  . ipratropium-albuterol  3 mL Nebulization Q4H  . levothyroxine  112 mcg Per Tube QAC breakfast  . methylPREDNISolone (SOLU-MEDROL) injection  40 mg Intravenous Q12H  . piperacillin-tazobactam (ZOSYN)  IV  3.375 g Intravenous Q8H  . polyethylene glycol  17 g Per Tube Daily  . potassium chloride  20 mEq Per Tube BID   Continuous Infusions: . sodium chloride 10 mL/hr at 12/09/14 2300  . fentaNYL infusion INTRAVENOUS 75 mcg/hr (12/10/14 [id number])    Principal Problem:   Acute respiratory failure with hypoxia Active Problems:   Essential hypertension   DM type 2 (diabetes mellitus, type 2)   Aspiration pneumonia   Thyroid mass   Time spent 25 minutes

## 2014-12-10 NOTE — Progress Notes (Signed)
Subjective: She has required higher FiO2. She is on more PEEP as well. She is awake and alert and looks comfortable.  Objective: Vital signs in last 24 hours: Temp:  [96.9 F (36.1 C)-97.7 F (36.5 C)] 97 F (36.1 C) (07/10 0400) Pulse Rate:  [53-120] 88 (07/10 0754) Resp:  [8-22] 17 (07/10 0754) BP: (108-168)/(59-116) 136/71 mmHg (07/10 0700) SpO2:  [85 %-100 %] 93 % (07/10 0754) FiO2 (%):  [40 %-100 %] 65 % (07/10 0754) Weight:  [108.9 kg (240 lb 1.3 oz)] 108.9 kg (240 lb 1.3 oz) (07/10 0600) Weight change: 0.6 kg (1 lb 5.2 oz) Last BM Date: 12/06/14  Intake/Output from previous day: 07/09 0701 - 07/10 0700 In: 1675.4 [I.V.:887.9; NG/GT:497.5; IV Piggyback:250] Out: 1250 [Urine:1250]  PHYSICAL EXAM General appearance: alert, cooperative, no distress and Intubated on mechanical ventilation Resp: rhonchi bilaterally Cardio: regular rate and rhythm, S1, S2 normal, no murmur, click, rub or gallop GI: soft, non-tender; bowel sounds normal; no masses,  no organomegaly Extremities: extremities normal, atraumatic, no cyanosis or edema  Lab Results:  Results for orders placed or performed during the hospital encounter of 12/30/2014 (from the past 48 hour(s))  Glucose, capillary     Status: Abnormal   Collection Time: 12/08/14 11:38 AM  Result Value Ref Range   Glucose-Capillary 157 (H) 65 - 99 mg/dL   Comment 1 Notify RN   Glucose, capillary     Status: Abnormal   Collection Time: 12/08/14  4:37 PM  Result Value Ref Range   Glucose-Capillary 177 (H) 65 - 99 mg/dL   Comment 1 Notify RN    Comment 2 Document in Chart   Glucose, capillary     Status: Abnormal   Collection Time: 12/08/14  8:06 PM  Result Value Ref Range   Glucose-Capillary 162 (H) 65 - 99 mg/dL   Comment 1 Notify RN   Glucose, capillary     Status: Abnormal   Collection Time: 12/08/14 11:02 PM  Result Value Ref Range   Glucose-Capillary 159 (H) 65 - 99 mg/dL   Comment 1 Notify RN   Glucose, capillary      Status: Abnormal   Collection Time: 12/09/14  3:05 AM  Result Value Ref Range   Glucose-Capillary 171 (H) 65 - 99 mg/dL   Comment 1 Notify RN   TSH     Status: None   Collection Time: 12/09/14  4:52 AM  Result Value Ref Range   TSH 0.386 0.350 - 4.500 uIU/mL  Vancomycin, random     Status: None   Collection Time: 12/09/14  4:52 AM  Result Value Ref Range   Vancomycin Rm 36 ug/mL    Comment:        Random Vancomycin therapeutic range is dependent on dosage and time of specimen collection. A peak range is 20.0-40.0 ug/mL A trough range is 5.0-15.0 ug/mL          Blood gas, arterial     Status: Abnormal   Collection Time: 12/09/14  5:05 AM  Result Value Ref Range   FIO2 40.00 %   Delivery systems VENTILATOR    Mode PRESSURE REGULATED VOLUME CONTROL    VT 500 mL   Rate 16 resp/min   Peep/cpap 5.0 cm H20   pH, Arterial 7.446 7.350 - 7.450   pCO2 arterial 36.6 35.0 - 45.0 mmHg   pO2, Arterial 102 (H) 80.0 - 100.0 mmHg   Bicarbonate 24.8 (H) 20.0 - 24.0 mEq/L   TCO2 22.3 0 - 100 mmol/L  Acid-Base Excess 1.1 0.0 - 2.0 mmol/L   O2 Saturation 97.7 %   Patient temperature 37.0    Collection site LEFT RADIAL    Drawn by [id number]    Sample type ARTERIAL    Allens test (pass/fail) PASS PASS  Glucose, capillary     Status: Abnormal   Collection Time: 12/09/14  7:50 AM  Result Value Ref Range   Glucose-Capillary 144 (H) 65 - 99 mg/dL   Comment 1 Notify RN   Glucose, capillary     Status: Abnormal   Collection Time: 12/09/14 11:29 AM  Result Value Ref Range   Glucose-Capillary 141 (H) 65 - 99 mg/dL   Comment 1 Notify RN   Glucose, capillary     Status: Abnormal   Collection Time: 12/09/14  4:15 PM  Result Value Ref Range   Glucose-Capillary 147 (H) 65 - 99 mg/dL   Comment 1 Notify RN   CBC     Status: Abnormal   Collection Time: 12/09/14  6:09 PM  Result Value Ref Range   WBC 12.5 (H) 4.0 - 10.5 K/uL   RBC 4.79 3.87 - 5.11 MIL/uL   Hemoglobin 13.1 12.0 - 15.0 g/dL   HCT  41.6 36.0 - 46.0 %   MCV 86.8 78.0 - 100.0 fL   MCH 27.3 26.0 - 34.0 pg   MCHC 31.5 30.0 - 36.0 g/dL   RDW 15.5 11.5 - 15.5 %   Platelets 169 150 - 400 K/uL  Glucose, capillary     Status: Abnormal   Collection Time: 12/09/14  7:48 PM  Result Value Ref Range   Glucose-Capillary 144 (H) 65 - 99 mg/dL   Comment 1 Notify RN   Glucose, capillary     Status: Abnormal   Collection Time: 12/09/14 11:31 PM  Result Value Ref Range   Glucose-Capillary 159 (H) 65 - 99 mg/dL   Comment 1 Notify RN   CBC     Status: Abnormal   Collection Time: 12/10/14 12:07 AM  Result Value Ref Range   WBC 21.8 (H) 4.0 - 10.5 K/uL   RBC 4.98 3.87 - 5.11 MIL/uL   Hemoglobin 13.6 12.0 - 15.0 g/dL   HCT 42.9 36.0 - 46.0 %   MCV 86.1 78.0 - 100.0 fL   MCH 27.3 26.0 - 34.0 pg   MCHC 31.7 30.0 - 36.0 g/dL   RDW 15.6 (H) 11.5 - 15.5 %   Platelets 227 150 - 400 K/uL  Glucose, capillary     Status: Abnormal   Collection Time: 12/10/14  3:20 AM  Result Value Ref Range   Glucose-Capillary 194 (H) 65 - 99 mg/dL   Comment 1 Notify RN   Vancomycin, random     Status: None   Collection Time: 12/10/14  4:45 AM  Result Value Ref Range   Vancomycin Rm 27 ug/mL    Comment:        Random Vancomycin therapeutic range is dependent on dosage and time of specimen collection. A peak range is 20.0-40.0 ug/mL A trough range is 5.0-15.0 ug/mL          Basic metabolic panel     Status: Abnormal   Collection Time: 12/10/14  4:45 AM  Result Value Ref Range   Sodium 144 135 - 145 mmol/L   Potassium 4.6 3.5 - 5.1 mmol/L   Chloride 113 (H) 101 - 111 mmol/L   CO2 23 22 - 32 mmol/L   Glucose, Bld 215 (H) 65 - 99 mg/dL  BUN 40 (H) 6 - 20 mg/dL   Creatinine, Ser 1.14 (H) 0.44 - 1.00 mg/dL   Calcium 8.3 (L) 8.9 - 10.3 mg/dL   GFR calc non Af Amer 48 (L) >60 mL/min   GFR calc Af Amer 56 (L) >60 mL/min    Comment: (NOTE) The eGFR has been calculated using the CKD EPI equation. This calculation has not been validated in all  clinical situations. eGFR's persistently <60 mL/min signify possible Chronic Kidney Disease.    Anion gap 8 5 - 15  CBC     Status: Abnormal   Collection Time: 12/10/14  4:45 AM  Result Value Ref Range   WBC 20.4 (H) 4.0 - 10.5 K/uL   RBC 4.82 3.87 - 5.11 MIL/uL   Hemoglobin 13.1 12.0 - 15.0 g/dL   HCT 41.5 36.0 - 46.0 %   MCV 86.1 78.0 - 100.0 fL   MCH 27.2 26.0 - 34.0 pg   MCHC 31.6 30.0 - 36.0 g/dL   RDW 15.6 (H) 11.5 - 15.5 %   Platelets 180 150 - 400 K/uL  Blood gas, arterial     Status: Abnormal   Collection Time: 12/10/14  5:39 AM  Result Value Ref Range   FIO2 65.00 %   Delivery systems VENTILATOR    Mode PRESSURE REGULATED VOLUME CONTROL    VT 560 mL   Rate 12 resp/min   Peep/cpap 10.0 cm H20   pH, Arterial 7.415 7.350 - 7.450   pCO2 arterial 37.6 35.0 - 45.0 mmHg   pO2, Arterial 57.2 (L) 80.0 - 100.0 mmHg   Bicarbonate 23.7 20.0 - 24.0 mEq/L   TCO2 21.2 0 - 100 mmol/L   Acid-base deficit 0.3 0.0 - 2.0 mmol/L   O2 Saturation 88.6 %   Patient temperature 37.0    Collection site LEFT RADIAL    Drawn by [id number]    Sample type ARTERIAL    Allens test (pass/fail) PASS PASS  Glucose, capillary     Status: Abnormal   Collection Time: 12/10/14  7:20 AM  Result Value Ref Range   Glucose-Capillary 217 (H) 65 - 99 mg/dL    ABGS  Recent Labs  12/10/14 0539  PHART 7.415  PO2ART 57.2*  TCO2 21.2  HCO3 23.7   CULTURES Recent Results (from the past 240 hour(s))  Blood culture (routine x 2)     Status: None   Collection Time: 12/04/14  4:00 AM  Result Value Ref Range Status   Specimen Description RIGHT ANTECUBITAL  Final   Special Requests BOTTLES DRAWN AEROBIC ONLY [place]  Final   Culture NO GROWTH 5 DAYS  Final   Report Status 12/09/2014 FINAL  Final  Blood culture (routine x 2)     Status: None   Collection Time: 12/04/14  4:16 AM  Result Value Ref Range Status   Specimen Description RIGHT ANTECUBITAL  Final   Special Requests BOTTLES DRAWN AEROBIC ONLY 6CC   Final   Culture NO GROWTH 5 DAYS  Final   Report Status 12/09/2014 FINAL  Final  MRSA PCR Screening     Status: None   Collection Time: 12/04/14  4:24 AM  Result Value Ref Range Status   MRSA by PCR NEGATIVE NEGATIVE Final    Comment:        The GeneXpert MRSA Assay (FDA approved for NASAL specimens only), is one component of a comprehensive MRSA colonization surveillance program. It is not intended to diagnose MRSA infection nor to guide or monitor treatment for  MRSA infections.    Studies/Results: Dg Chest Port 1 View  12/09/2014   CLINICAL DATA:  Orogastric tube placement  EXAM: PORTABLE CHEST - 1 VIEW  COMPARISON:  Earlier film, same date  FINDINGS: The endotracheal tube is stable. The NG tube is coursing down the esophagus and into the stomach. Slight improved left upper lobe lung aeration. The right lung remains clear per  IMPRESSION: New NG tube is coursing down the esophagus and into the stomach.  Slight improved left upper lobe lung aeration.   Electronically Signed   By: Marijo Sanes M.D.   On: 12/09/2014 18:27   Dg Chest Port 1v Same Day  12/09/2014   CLINICAL DATA:  Patient with aspiration pneumonia. Prior intubation.  EXAM: PORTABLE CHEST - 1 VIEW SAME DAY  COMPARISON:  Chest radiograph 12/08/2014  FINDINGS: Monitoring leads overlie the patient. ET tube terminates in the mid trachea. Interval removal enteric tube. Interval development of heterogeneous opacities throughout the left lung. Elevation of the left hemidiaphragm. Possible small left pleural effusion. The right lung is clear.  IMPRESSION: Interval development of diffuse heterogeneous opacities throughout the left lung which may represent sequelae of aspiration, infection or asymmetric edema.   Electronically Signed   By: Lovey Newcomer M.D.   On: 12/09/2014 16:08    Medications:  Prior to Admission:  Prescriptions prior to admission  Medication Sig Dispense Refill Last Dose  . Alum & Mag Hydroxide-Simeth (MAGIC  MOUTHWASH) SOLN Take 10 mLs by mouth 3 (three) times daily.  0 12/04/2014 at Unknown time  . aspirin 81 MG tablet Take 162 mg by mouth daily.    12/04/2014 at Unknown time  . BREO ELLIPTA 100-25 MCG/INH AEPB Inhale 1 puff into the lungs daily.   12/04/2014 at Unknown time  . carvedilol (COREG) 25 MG tablet Take 1 tablet (25 mg total) by mouth 2 (two) times daily with a meal. 60 tablet 11 12/04/2014 at 0900  . Coenzyme Q10 (CO Q 10) 100 MG CAPS Take 100 mg by mouth 2 (two) times daily.    12/04/2014 at Unknown time  . ferrous sulfate 325 (65 FE) MG tablet Take 325 mg by mouth daily.   12/04/2014 at Unknown time  . fluconazole (DIFLUCAN) 100 MG tablet Take 1 tablet (100 mg total) by mouth daily. For 7 days 7 tablet 0 12/04/2014 at Unknown time  . folic acid (FOLVITE) 1 MG tablet Take 1 tablet (1 mg total) by mouth daily. 90 tablet 3 12/04/2014 at Unknown time  . furosemide (LASIX) 40 MG tablet Take 40 mg by mouth daily.    12/04/2014 at Unknown time  . gabapentin (NEURONTIN) 100 MG capsule Take 100 mg by mouth 3 (three) times daily.   12/04/2014 at Unknown time  . levalbuterol (XOPENEX) 0.63 MG/3ML nebulizer solution Take 3 mLs by nebulization every 6 (six) hours as needed for wheezing or shortness of breath.    unknown  . levothyroxine (SYNTHROID, LEVOTHROID) 112 MCG tablet Take 112 mcg by mouth daily before breakfast.   12/04/2014 at Unknown time  . lidocaine (XYLOCAINE) 2 % solution Use as directed 10 mLs in the mouth or throat 3 (three) times daily. 100 mL 0 12/04/2014 at Unknown time  . niacin 500 MG tablet Take 500 mg by mouth at bedtime.    12/18/2014 at Unknown time  . ondansetron (ZOFRAN ODT) 8 MG disintegrating tablet Take 1 tablet (8 mg total) by mouth every 8 (eight) hours as needed. 73m ODT q4 hours prn nausea  12 tablet 0 unknown  . potassium chloride SA (K-DUR,KLOR-CON) 20 MEQ tablet Take 20 mEq by mouth 2 (two) times daily.   12/04/2014 at Unknown time  . pravastatin (PRAVACHOL) 80 MG tablet Take 1 tablet (80 mg  total) by mouth daily. 90 tablet 3 12/28/2014 at Unknown time  . predniSONE (DELTASONE) 20 MG tablet Take 41m po daily for 3 more days (Patient taking differently: Take 40 mg by mouth 3 (three) times daily. )   12/04/2014 at Unknown time  . ranitidine (ZANTAC) 150 MG tablet Take 150 mg by mouth at bedtime.   12/02/2014 at Unknown time  . SPIRIVA HANDIHALER 18 MCG inhalation capsule Take 18 mcg by mouth daily.   12/04/2014 at Unknown time  . ZETIA 10 MG tablet TAKE ONE TABLET BY MOUTH ONCE DAILY. 30 tablet 11 12/04/2014 at Unknown time   Scheduled: . antiseptic oral rinse  7 mL Mouth Rinse QID  . carvedilol  25 mg Per Tube BID WC  . chlorhexidine  15 mL Mouth Rinse BID  . famotidine (PEPCID) IV  20 mg Intravenous Q12H  . feeding supplement (VITAL HIGH PROTEIN)  1,000 mL Per Tube Q24H  . insulin aspart  0-15 Units Subcutaneous 6 times per day  . ipratropium-albuterol  3 mL Nebulization Q4H  . levothyroxine  112 mcg Per Tube QAC breakfast  . methylPREDNISolone (SOLU-MEDROL) injection  40 mg Intravenous Q12H  . piperacillin-tazobactam (ZOSYN)  IV  3.375 g Intravenous Q8H  . polyethylene glycol  17 g Per Tube Daily  . potassium chloride  20 mEq Per Tube BID   Continuous: . sodium chloride 10 mL/hr at 12/09/14 2300  . fentaNYL infusion INTRAVENOUS 75 mcg/hr (12/10/14 0629)   PNUU:[id number] fentaNYL, LORazepam, midazolam, midazolam, ondansetron **OR** ondansetron (ZOFRAN) IV  Assesment: She has acute ventilator-dependent respiratory failure. This is likely due to aspiration. She may have left lower lobe collapse. Discussed bronchoscopy with her. She is not in favor of that at this time. I told her we can continue to try intensive pulmonary toilet recheck her chest x-ray tomorrow but she still shows findings of left lower lobe collapse she may need to reconsider bronchoscopy Principal Problem:   Acute respiratory failure with hypoxia Active Problems:   Essential hypertension   DM type 2 (diabetes  mellitus, type 2)   Aspiration pneumonia   Thyroid mass    Plan: As above    LOS: 6 days   Izetta Sakamoto L 12/10/2014, 8:40 AM

## 2014-12-11 ENCOUNTER — Inpatient Hospital Stay (HOSPITAL_COMMUNITY): Payer: Commercial Managed Care - HMO

## 2014-12-11 LAB — BLOOD GAS, ARTERIAL
ACID-BASE DEFICIT: 1.6 mmol/L (ref 0.0–2.0)
ACID-BASE DEFICIT: 1.7 mmol/L (ref 0.0–2.0)
BICARBONATE: 22.4 meq/L (ref 20.0–24.0)
Bicarbonate: 23.6 mEq/L (ref 20.0–24.0)
DRAWN BY: 25788
DRAWN BY: 22223
FIO2: 40 %
FIO2: 40 %
MECHVT: 560 mL
MODE: POSITIVE
O2 SAT: 97.6 %
O2 Saturation: 97.1 %
PATIENT TEMPERATURE: 37
PCO2 ART: 46.6 mmHg — AB (ref 35.0–45.0)
PCO2 ART: 37.6 mmHg (ref 35.0–45.0)
PEEP: 5 cmH2O
PEEP: 10 cmH2O
PO2 ART: 110 mmHg — AB (ref 80.0–100.0)
Patient temperature: 37
Pressure support: 5 cmH2O
RATE: 12 resp/min
TCO2: 21.6 mmol/L (ref 0–100)
TCO2: 20.4 mmol/L (ref 0–100)
pH, Arterial: 7.324 — ABNORMAL LOW (ref 7.350–7.450)
pH, Arterial: 7.393 (ref 7.350–7.450)
pO2, Arterial: 100 mmHg (ref 80.0–100.0)

## 2014-12-11 LAB — VANCOMYCIN, RANDOM: Vancomycin Rm: 20 ug/mL

## 2014-12-11 LAB — GLUCOSE, CAPILLARY
GLUCOSE-CAPILLARY: 227 mg/dL — AB (ref 65–99)
GLUCOSE-CAPILLARY: 154 mg/dL — AB (ref 65–99)
Glucose-Capillary: 237 mg/dL — ABNORMAL HIGH (ref 65–99)
Glucose-Capillary: 190 mg/dL — ABNORMAL HIGH (ref 65–99)
Glucose-Capillary: 164 mg/dL — ABNORMAL HIGH (ref 65–99)

## 2014-12-11 MED ORDER — SENNA 8.6 MG PO TABS: 1.0000 | ORAL_TABLET | Freq: Every day | ORAL | Status: DC

## 2014-12-11 MED ORDER — DILTIAZEM HCL 100 MG IV SOLR
5.0000 mg/h | INTRAVENOUS | Status: DC
  Administered 2014-12-11: 5 mg/h via INTRAVENOUS
  Administered 2014-12-12 (×3): 15 mg/h via INTRAVENOUS
  Administered 2014-12-13: 12 mg/h via INTRAVENOUS
  Administered 2014-12-13: 15 mg/h via INTRAVENOUS
  Filled 2014-12-11: qty 100

## 2014-12-11 MED ORDER — VANCOMYCIN HCL IN DEXTROSE 1-5 GM/200ML-% IV SOLN
1000.0000 mg | Freq: Once | INTRAVENOUS | Status: AC
  Administered 2014-12-11: 1000 mg via INTRAVENOUS
  Filled 2014-12-11: qty 200

## 2014-12-11 MED ORDER — CARVEDILOL 12.5 MG PO TABS
12.5000 mg | ORAL_TABLET | Freq: Two times a day (BID) | ORAL | Status: DC
  Administered 2014-12-11: 12.5 mg via ORAL
  Filled 2014-12-11: qty 1

## 2014-12-11 MED ORDER — POLYETHYLENE GLYCOL 3350 17 G PO PACK: 17.0000 g | PACK | Freq: Two times a day (BID) | ORAL | Status: DC

## 2014-12-11 MED ORDER — POLYETHYLENE GLYCOL 3350 17 G PO PACK
17.0000 g | PACK | Freq: Two times a day (BID) | ORAL | Status: DC
  Administered 2014-12-12 – 2014-12-13 (×2): 17 g via ORAL
  Filled 2014-12-11 (×3): qty 1

## 2014-12-11 MED ORDER — SENNA 8.6 MG PO TABS
1.0000 | ORAL_TABLET | Freq: Every day | ORAL | Status: DC
  Administered 2014-12-12: 8.6 mg via ORAL
  Filled 2014-12-11 (×2): qty 1

## 2014-12-11 MED ORDER — FUROSEMIDE 40 MG PO TABS
40.0000 mg | ORAL_TABLET | Freq: Every day | ORAL | Status: DC
  Administered 2014-12-12: 40 mg via ORAL
  Filled 2014-12-11: qty 1

## 2014-12-11 MED ORDER — POTASSIUM CHLORIDE 10 MEQ/100ML IV SOLN
10.0000 meq | INTRAVENOUS | Status: DC
Start: 2014-12-11 — End: 2014-12-11

## 2014-12-11 MED ORDER — DILTIAZEM LOAD VIA INFUSION
10.0000 mg | Freq: Once | INTRAVENOUS | Status: AC
  Administered 2014-12-11: 10 mg via INTRAVENOUS
  Filled 2014-12-11: qty 10

## 2014-12-11 MED ORDER — LEVOTHYROXINE SODIUM 112 MCG PO TABS
112.0000 ug | ORAL_TABLET | Freq: Every day | ORAL | Status: DC
  Administered 2014-12-12: 112 ug via ORAL
  Filled 2014-12-11: qty 1

## 2014-12-11 NOTE — Progress Notes (Signed)
Subjective: She is alert. She has been able to come down to 40% oxygen. Chest x-ray today is a little less suggestive that she has mucus plugging and lobar collapse.  Objective: Vital signs in last 24 hours: Temp:  [97.4 F (36.3 C)-98 F (36.7 C)] 98 F (36.7 C) (07/11 0400) Pulse Rate:  [55-89] 78 (07/11 0800) Resp:  [13-35] 18 (07/11 0800) BP: (99-170)/(61-92) 154/85 mmHg (07/11 0800) SpO2:  [94 %-100 %] 99 % (07/11 0800) FiO2 (%):  [40 %-65 %] 40 % (07/11 0740) Weight:  [108.7 kg (239 lb 10.2 oz)] 108.7 kg (239 lb 10.2 oz) (07/11 0530) Weight change: -0.2 kg (-7.1 oz) Last BM Date: 12/06/14  Intake/Output from previous day: 07/10 0701 - 07/11 0700 In: 1543.7 [I.V.:358.7; NG/GT:935; IV Piggyback:250] Out: 1100 [Urine:1100]  PHYSICAL EXAM General appearance: alert, cooperative, mild distress and Intubated on the ventilator but alert Resp: rhonchi bilaterally Cardio: regular rate and rhythm, S1, S2 normal, no murmur, click, rub or gallop GI: soft, non-tender; bowel sounds normal; no masses,  no organomegaly Extremities: extremities normal, atraumatic, no cyanosis or edema  Lab Results:  Results for orders placed or performed during the hospital encounter of 12/17/2014 (from the past 48 hour(s))  Glucose, capillary     Status: Abnormal   Collection Time: 12/09/14 11:29 AM  Result Value Ref Range   Glucose-Capillary 141 (H) 65 - 99 mg/dL   Comment 1 Notify RN   Glucose, capillary     Status: Abnormal   Collection Time: 12/09/14  4:15 PM  Result Value Ref Range   Glucose-Capillary 147 (H) 65 - 99 mg/dL   Comment 1 Notify RN   CBC     Status: Abnormal   Collection Time: 12/09/14  6:09 PM  Result Value Ref Range   WBC 12.5 (H) 4.0 - 10.5 K/uL   RBC 4.79 3.87 - 5.11 MIL/uL   Hemoglobin 13.1 12.0 - 15.0 g/dL   HCT 41.6 36.0 - 46.0 %   MCV 86.8 78.0 - 100.0 fL   MCH 27.3 26.0 - 34.0 pg   MCHC 31.5 30.0 - 36.0 g/dL   RDW 15.5 11.5 - 15.5 %   Platelets 169 150 - 400 K/uL   Glucose, capillary     Status: Abnormal   Collection Time: 12/09/14  7:48 PM  Result Value Ref Range   Glucose-Capillary 144 (H) 65 - 99 mg/dL   Comment 1 Notify RN   Glucose, capillary     Status: Abnormal   Collection Time: 12/09/14 11:31 PM  Result Value Ref Range   Glucose-Capillary 159 (H) 65 - 99 mg/dL   Comment 1 Notify RN   CBC     Status: Abnormal   Collection Time: 12/10/14 12:07 AM  Result Value Ref Range   WBC 21.8 (H) 4.0 - 10.5 K/uL   RBC 4.98 3.87 - 5.11 MIL/uL   Hemoglobin 13.6 12.0 - 15.0 g/dL   HCT 42.9 36.0 - 46.0 %   MCV 86.1 78.0 - 100.0 fL   MCH 27.3 26.0 - 34.0 pg   MCHC 31.7 30.0 - 36.0 g/dL   RDW 15.6 (H) 11.5 - 15.5 %   Platelets 227 150 - 400 K/uL  Glucose, capillary     Status: Abnormal   Collection Time: 12/10/14  3:20 AM  Result Value Ref Range   Glucose-Capillary 194 (H) 65 - 99 mg/dL   Comment 1 Notify RN   Vancomycin, random     Status: None   Collection Time: 12/10/14  4:45 AM  Result Value Ref Range   Vancomycin Rm 27 ug/mL    Comment:        Random Vancomycin therapeutic range is dependent on dosage and time of specimen collection. A peak range is 20.0-40.0 ug/mL A trough range is 5.0-15.0 ug/mL          Basic metabolic panel     Status: Abnormal   Collection Time: 12/10/14  4:45 AM  Result Value Ref Range   Sodium 144 135 - 145 mmol/L   Potassium 4.6 3.5 - 5.1 mmol/L   Chloride 113 (H) 101 - 111 mmol/L   CO2 23 22 - 32 mmol/L   Glucose, Bld 215 (H) 65 - 99 mg/dL   BUN 40 (H) 6 - 20 mg/dL   Creatinine, Ser 1.14 (H) 0.44 - 1.00 mg/dL   Calcium 8.3 (L) 8.9 - 10.3 mg/dL   GFR calc non Af Amer 48 (L) >60 mL/min   GFR calc Af Amer 56 (L) >60 mL/min    Comment: (NOTE) The eGFR has been calculated using the CKD EPI equation. This calculation has not been validated in all clinical situations. eGFR's persistently <60 mL/min signify possible Chronic Kidney Disease.    Anion gap 8 5 - 15  CBC     Status: Abnormal   Collection  Time: 12/10/14  4:45 AM  Result Value Ref Range   WBC 20.4 (H) 4.0 - 10.5 K/uL   RBC 4.82 3.87 - 5.11 MIL/uL   Hemoglobin 13.1 12.0 - 15.0 g/dL   HCT 41.5 36.0 - 46.0 %   MCV 86.1 78.0 - 100.0 fL   MCH 27.2 26.0 - 34.0 pg   MCHC 31.6 30.0 - 36.0 g/dL   RDW 15.6 (H) 11.5 - 15.5 %   Platelets 180 150 - 400 K/uL  Blood gas, arterial     Status: Abnormal   Collection Time: 12/10/14  5:39 AM  Result Value Ref Range   FIO2 65.00 %   Delivery systems VENTILATOR    Mode PRESSURE REGULATED VOLUME CONTROL    VT 560 mL   Rate 12 resp/min   Peep/cpap 10.0 cm H20   pH, Arterial 7.415 7.350 - 7.450   pCO2 arterial 37.6 35.0 - 45.0 mmHg   pO2, Arterial 57.2 (L) 80.0 - 100.0 mmHg   Bicarbonate 23.7 20.0 - 24.0 mEq/L   TCO2 21.2 0 - 100 mmol/L   Acid-base deficit 0.3 0.0 - 2.0 mmol/L   O2 Saturation 88.6 %   Patient temperature 37.0    Collection site LEFT RADIAL    Drawn by [id number]    Sample type ARTERIAL    Allens test (pass/fail) PASS PASS  Glucose, capillary     Status: Abnormal   Collection Time: 12/10/14  7:20 AM  Result Value Ref Range   Glucose-Capillary 217 (H) 65 - 99 mg/dL  Glucose, capillary     Status: Abnormal   Collection Time: 12/10/14 11:10 AM  Result Value Ref Range   Glucose-Capillary 204 (H) 65 - 99 mg/dL  Glucose, capillary     Status: Abnormal   Collection Time: 12/10/14  3:32 PM  Result Value Ref Range   Glucose-Capillary 193 (H) 65 - 99 mg/dL   Comment 1 Notify RN   Glucose, capillary     Status: Abnormal   Collection Time: 12/10/14  8:02 PM  Result Value Ref Range   Glucose-Capillary 220 (H) 65 - 99 mg/dL   Comment 1 Notify RN    Comment 2  Document in Chart   Glucose, capillary     Status: Abnormal   Collection Time: 12/10/14 11:27 PM  Result Value Ref Range   Glucose-Capillary 206 (H) 65 - 99 mg/dL  Glucose, capillary     Status: Abnormal   Collection Time: 12/11/14  3:53 AM  Result Value Ref Range   Glucose-Capillary 237 (H) 65 - 99 mg/dL   Comment 1  Notify RN   Vancomycin, random     Status: None   Collection Time: 12/11/14  4:58 AM  Result Value Ref Range   Vancomycin Rm 20 ug/mL    Comment:        Random Vancomycin therapeutic range is dependent on dosage and time of specimen collection. A peak range is 20.0-40.0 ug/mL A trough range is 5.0-15.0 ug/mL          Blood gas, arterial     Status: None   Collection Time: 12/11/14  5:30 AM  Result Value Ref Range   FIO2 40.00 %   Delivery systems VENTILATOR    Mode PRESSURE REGULATED VOLUME CONTROL    VT 560 mL   Rate 12 resp/min   Peep/cpap 10.0 cm H20   pH, Arterial 7.393 7.350 - 7.450   pCO2 arterial 37.6 35.0 - 45.0 mmHg   pO2, Arterial 100 80.0 - 100.0 mmHg   Bicarbonate 22.4 20.0 - 24.0 mEq/L   TCO2 20.4 0 - 100 mmol/L   Acid-base deficit 1.7 0.0 - 2.0 mmol/L   O2 Saturation 97.1 %   Patient temperature 37.0    Collection site LEFT RADIAL    Drawn by [id number]    Sample type ARTERIAL    Allens test (pass/fail) PASS PASS  Glucose, capillary     Status: Abnormal   Collection Time: 12/11/14  7:30 AM  Result Value Ref Range   Glucose-Capillary 227 (H) 65 - 99 mg/dL   Comment 1 Notify RN    Comment 2 Document in Chart     ABGS  Recent Labs  12/11/14 0530  PHART 7.393  PO2ART 100  TCO2 20.4  HCO3 22.4   CULTURES Recent Results (from the past 240 hour(s))  Blood culture (routine x 2)     Status: None   Collection Time: 12/04/14  4:00 AM  Result Value Ref Range Status   Specimen Description RIGHT ANTECUBITAL  Final   Special Requests BOTTLES DRAWN AEROBIC ONLY [place]  Final   Culture NO GROWTH 5 DAYS  Final   Report Status 12/09/2014 FINAL  Final  Blood culture (routine x 2)     Status: None   Collection Time: 12/04/14  4:16 AM  Result Value Ref Range Status   Specimen Description RIGHT ANTECUBITAL  Final   Special Requests BOTTLES DRAWN AEROBIC ONLY 6CC  Final   Culture NO GROWTH 5 DAYS  Final   Report Status 12/09/2014 FINAL  Final  MRSA PCR Screening      Status: None   Collection Time: 12/04/14  4:24 AM  Result Value Ref Range Status   MRSA by PCR NEGATIVE NEGATIVE Final    Comment:        The GeneXpert MRSA Assay (FDA approved for NASAL specimens only), is one component of a comprehensive MRSA colonization surveillance program. It is not intended to diagnose MRSA infection nor to guide or monitor treatment for MRSA infections.    Studies/Results: Dg Chest Port 1 View  12/11/2014   CLINICAL DATA:  69 year old female with shortness of breath and intubated. Possible  aspiration.  EXAM: PORTABLE CHEST - 1 VIEW  COMPARISON:  Prior chest x-ray 12/10/2014  FINDINGS: The endotracheal tube is 4 cm above the carina. A nasogastric tube is present. The tip is not identified but lies below the diaphragm presumably within the stomach. A left upper extremity PICC is well positioned with the tip overlying the superior cavoatrial junction. Cardiac and mediastinal contours remain within normal limits. Atherosclerotic calcifications are present in the transverse aorta. Persistent elevation of the left hemidiaphragm with associated small left pleural effusion and left basilar atelectasis or consolidation. The right lung base is clear. No evidence of new right basilar infiltrate. No pneumothorax or pulmonary edema. No acute osseous abnormality.  IMPRESSION: 1. No significant interval change in the appearance of the chest. 2. Persistent elevation of the left hemidiaphragm with associated left basilar atelectasis versus infiltrate. 3. Trace left pleural effusion suspected. 4. Stable and satisfactory support apparatus.   Electronically Signed   By: Jacqulynn Cadet M.D.   On: 12/11/2014 07:52   Dg Chest Port 1 View  12/10/2014   CLINICAL DATA:  PICC line placement  EXAM: PORTABLE CHEST - 1 VIEW  COMPARISON:  12/10/2014 at 0614 hr  FINDINGS: Patchy left lower lobe opacity, suspicious for pneumonia. No pleural effusion or pneumothorax.  Left arm PICC terminates at the  cavoatrial junction.  The heart is normal in size.  Enteric tube courses into the stomach.  IMPRESSION: Patchy left lower lobe opacity, suspicious for pneumonia.  Left arm PICC terminates at the cavoatrial junction.   Electronically Signed   By: Julian Hy M.D.   On: 12/10/2014 15:34   Dg Chest Port 1 View  12/10/2014   CLINICAL DATA:  Aspiration pneumonia follow-up.  EXAM: PORTABLE CHEST - 1 VIEW  COMPARISON:  12/09/2014 and prior radiographs  FINDINGS: An endotracheal tube with tip 3.5 cm above carina and NG tube entering the stomach with tip off the field of view again noted.  Left lower lung airspace disease again identified and not significantly changed.  There is no evidence of pneumothorax.  No other interval changes identified.  IMPRESSION: Little interval change with left lower lung airspace disease/ pneumonia.   Electronically Signed   By: Margarette Canada M.D.   On: 12/10/2014 09:27   Dg Chest Port 1 View  12/09/2014   CLINICAL DATA:  Orogastric tube placement  EXAM: PORTABLE CHEST - 1 VIEW  COMPARISON:  Earlier film, same date  FINDINGS: The endotracheal tube is stable. The NG tube is coursing down the esophagus and into the stomach. Slight improved left upper lobe lung aeration. The right lung remains clear per  IMPRESSION: New NG tube is coursing down the esophagus and into the stomach.  Slight improved left upper lobe lung aeration.   Electronically Signed   By: Marijo Sanes M.D.   On: 12/09/2014 18:27   Dg Chest Port 1v Same Day  12/09/2014   CLINICAL DATA:  Patient with aspiration pneumonia. Prior intubation.  EXAM: PORTABLE CHEST - 1 VIEW SAME DAY  COMPARISON:  Chest radiograph 12/08/2014  FINDINGS: Monitoring leads overlie the patient. ET tube terminates in the mid trachea. Interval removal enteric tube. Interval development of heterogeneous opacities throughout the left lung. Elevation of the left hemidiaphragm. Possible small left pleural effusion. The right lung is clear.  IMPRESSION:  Interval development of diffuse heterogeneous opacities throughout the left lung which may represent sequelae of aspiration, infection or asymmetric edema.   Electronically Signed   By: Polly Cobia.D.  On: 12/09/2014 16:08    Medications:  Prior to Admission:  Prescriptions prior to admission  Medication Sig Dispense Refill Last Dose  . Alum & Mag Hydroxide-Simeth (MAGIC MOUTHWASH) SOLN Take 10 mLs by mouth 3 (three) times daily.  0 12/04/2014 at Unknown time  . aspirin 81 MG tablet Take 162 mg by mouth daily.    12/04/2014 at Unknown time  . BREO ELLIPTA 100-25 MCG/INH AEPB Inhale 1 puff into the lungs daily.   12/04/2014 at Unknown time  . carvedilol (COREG) 25 MG tablet Take 1 tablet (25 mg total) by mouth 2 (two) times daily with a meal. 60 tablet 11 12/04/2014 at 0900  . Coenzyme Q10 (CO Q 10) 100 MG CAPS Take 100 mg by mouth 2 (two) times daily.    12/04/2014 at Unknown time  . ferrous sulfate 325 (65 FE) MG tablet Take 325 mg by mouth daily.   12/04/2014 at Unknown time  . fluconazole (DIFLUCAN) 100 MG tablet Take 1 tablet (100 mg total) by mouth daily. For 7 days 7 tablet 0 12/04/2014 at Unknown time  . folic acid (FOLVITE) 1 MG tablet Take 1 tablet (1 mg total) by mouth daily. 90 tablet 3 12/04/2014 at Unknown time  . furosemide (LASIX) 40 MG tablet Take 40 mg by mouth daily.    12/04/2014 at Unknown time  . gabapentin (NEURONTIN) 100 MG capsule Take 100 mg by mouth 3 (three) times daily.   12/04/2014 at Unknown time  . levalbuterol (XOPENEX) 0.63 MG/3ML nebulizer solution Take 3 mLs by nebulization every 6 (six) hours as needed for wheezing or shortness of breath.    unknown  . levothyroxine (SYNTHROID, LEVOTHROID) 112 MCG tablet Take 112 mcg by mouth daily before breakfast.   12/04/2014 at Unknown time  . lidocaine (XYLOCAINE) 2 % solution Use as directed 10 mLs in the mouth or throat 3 (three) times daily. 100 mL 0 12/04/2014 at Unknown time  . niacin 500 MG tablet Take 500 mg by mouth at bedtime.     12/23/2014 at Unknown time  . ondansetron (ZOFRAN ODT) 8 MG disintegrating tablet Take 1 tablet (8 mg total) by mouth every 8 (eight) hours as needed. 59m ODT q4 hours prn nausea 12 tablet 0 unknown  . potassium chloride SA (K-DUR,KLOR-CON) 20 MEQ tablet Take 20 mEq by mouth 2 (two) times daily.   12/04/2014 at Unknown time  . pravastatin (PRAVACHOL) 80 MG tablet Take 1 tablet (80 mg total) by mouth daily. 90 tablet 3 12/28/2014 at Unknown time  . predniSONE (DELTASONE) 20 MG tablet Take 452mpo daily for 3 more days (Patient taking differently: Take 40 mg by mouth 3 (three) times daily. )   12/04/2014 at Unknown time  . ranitidine (ZANTAC) 150 MG tablet Take 150 mg by mouth at bedtime.   12/11/2014 at Unknown time  . SPIRIVA HANDIHALER 18 MCG inhalation capsule Take 18 mcg by mouth daily.   12/04/2014 at Unknown time  . ZETIA 10 MG tablet TAKE ONE TABLET BY MOUTH ONCE DAILY. 30 tablet 11 12/04/2014 at Unknown time   Scheduled: . antiseptic oral rinse  7 mL Mouth Rinse QID  . carvedilol  12.5 mg Per Tube BID WC  . chlorhexidine  15 mL Mouth Rinse BID  . famotidine (PEPCID) IV  20 mg Intravenous Q12H  . feeding supplement (VITAL HIGH PROTEIN)  1,000 mL Per Tube Q24H  . furosemide  40 mg Per Tube Daily  . insulin aspart  0-15 Units Subcutaneous 6  times per day  . ipratropium-albuterol  3 mL Nebulization Q4H  . levothyroxine  112 mcg Per Tube QAC breakfast  . methylPREDNISolone (SOLU-MEDROL) injection  40 mg Intravenous Q12H  . piperacillin-tazobactam (ZOSYN)  IV  3.375 g Intravenous Q8H  . polyethylene glycol  17 g Per Tube Daily  . potassium chloride  20 mEq Per Tube BID  . sodium chloride  10-40 mL Intracatheter Q12H   Continuous: . sodium chloride 10 mL/hr at 12/10/14 2200  . fentaNYL infusion INTRAVENOUS 100 mcg/hr (12/11/14 0745)   ZXY:OFVWAQLRJ, fentaNYL, LORazepam, midazolam, midazolam, ondansetron **OR** ondansetron (ZOFRAN) IV, sodium chloride  Assesment: She was admitted with shortness of  breath then had aspiration and acute respiratory failure. She required intubation and mechanical ventilation. There was some question of lobar collapse on her chest x-ray and chest x-ray today looks a little bit less like that. She generally looks better. At baseline she has a fairly poor cough effort. Principal Problem:   Acute respiratory failure with hypoxia Active Problems:   Essential hypertension   DM type 2 (diabetes mellitus, type 2)   Aspiration pneumonia   Thyroid mass    Plan: See if she is able to be extubated today    LOS: 7 days   Nellene Courtois L 12/11/2014, 8:04 AM

## 2014-12-11 NOTE — Progress Notes (Signed)
Wasted 200cc of 2500mcg Fentanyl in 250cc in 0.9% NS in sink. Witnessed by Helen Hashimotoynthia Phillips, RN.

## 2014-12-11 NOTE — Evaluation (Signed)
Clinical/Bedside Swallow Evaluation Patient Details  Name: Samantha [name] MRN: [id number] Date of Birth: [birth year]  Today's Date: 12/11/2014 Time: SLP Start Time (ACUTE ONLY): 1700 SLP Stop Time (ACUTE ONLY): 1735 SLP Time Calculation (min) (ACUTE ONLY): 35 min  Past Medical History:  Past Medical History  Diagnosis Date  . Brain aneurysm   . Neuropathy   . Diabetes mellitus   . Anemia   . Thyroid disease   . Arthritis   . Macular degeneration   . Chest pain   . Hypertension   . Hyperlipidemia   . Obesity   . Abnormal stress test     -false positive, prob breast attenuation  . Hx of cardiac catheterization 11/2013    normal coronary arteries  . Sleep apnea     doesn't wear CPAP, sleeps in a lift chair  . Difficult intubation 02/08/14    re-intubated in PACU  . COPD (chronic obstructive pulmonary disease)   . CHF (congestive heart failure)   . Brain aneurysm 2004  . Diabetes mellitus without complication   . Renal disorder   . Malnutrition    Past Surgical History:  Past Surgical History  Procedure Laterality Date  . Tonsillectomy    . Lower back    . Heel spurs    . Hammer toes    . Right knee sark    . Cerebral aneurysm repair    . Cardiac catheterization  12/22/13    done for "angina" and abnormal nuc study- normal coronary arteries  . Cardiac catheterization  2004    insignificant CAD  . Cholecystectomy N/A 02/08/2014    Procedure: LAPAROSCOPIC CHOLECYSTECTOMY;  Surgeon: Dalia HeadingMark A Jenkins, MD;  Location: [facility];  Service: General;  Laterality: N/A;  . Esophagogastroduodenoscopy  2007    Dr. Jena Gaussourk: mottled patchy erythema of stomach, small bowel bx negative  . Left heart catheterization with coronary angiogram N/A 12/22/2013    Procedure: LEFT HEART CATHETERIZATION WITH CORONARY ANGIOGRAM;  Surgeon: Runell GessJonathan J Berry, MD;  [facility];  Service: Cardiovascular;  Laterality: N/A;  . Transthoracic echocardiogram  10/05/2012    normal EF  . Doppler  echocardiography  09/27/2010    EF>55%  . Nm myoview ltd  08/26/2011    EF 51% Low risk scan  . Cardiac catheterization  11/16/2002  . Lower ext arterial dopplers   11/11/2012    Abnormal dopplers  . Lower ext arterial dopplers  04/10/2011    abnormal dopplers  . Carotid duplex  04/10/2011    mildly abnormal carotid duplex  . Abdominal aortogram  05/07/2006  . Sleep study  08/10/2011  . Cpap report  09/03/2011  . Cholecystectomy    . Appendectomy    . Back surgery  [id number]   HPI:  69 year old woman with history of COPD, dysphagia presented with shortness of breath and hypoxic respiratory failure requiring high flow oxygen. CT chest revealed aspiration pneumonia. Treated with IV abx with some improvement. Subsequently decompensated with severe hypoxia and was intubated.   Assessment / Plan / Recommendation Clinical Impression  Recommend D2/chopped with NTL; no thin liquids, ok for occasional ice chip for comfort after oral care. Pt had MBSS during last admission and D3 with nectar-thick liquids was recommended, pt aspirated thin and NTL (with straw). Pt with audible swallow suggestive of air in pharynx/decreased vocal fold closure. Continue with pharyngeal strengthening and vocal fold adduction exercises. CT neck showed mass near thyroid -question whether this is impacting swallow. SLP to follow. Pt and  spouse in agreement with plan of care. SLP to follow for diet tolerance and upgrades as appropriate.    Aspiration Risk  Mild    Diet Recommendation Dysphagia 2 (Fine chop);Nectar   Medication Administration: Crushed with puree Compensations: Slow rate;Small sips/bites;Multiple dry swallows after each bite/sip    Other  Recommendations Oral Care Recommendations: Oral care BID Other Recommendations: Order thickener from pharmacy;Have oral suction available;Clarify dietary restrictions   Follow Up Recommendations       Frequency and Duration min 2x/week  1 week   Pertinent Vitals/Pain VSS    SLP Swallow Goals   Pt will demonstrate safe and efficient consumption of least restrictive diet with use of strategies as needed.    Swallow Study Prior Functional Status       General Date of Onset: 11/29/14 Other Pertinent Information: 69 year old woman with history of COPD, dysphagia presented with shortness of breath and hypoxic respiratory failure requiring high flow oxygen. CT chest revealed aspiration pneumonia. Treated with IV abx with some improvement. Subsequently decompensated with severe hypoxia and was intubated. Type of Study: Bedside swallow evaluation Previous Swallow Assessment: 12/01/2014 MBSS Diet Prior to this Study: NPO Temperature Spikes Noted: No Respiratory Status: Supplemental O2 delivered via (comment) History of Recent Intubation: Yes Length of Intubations (days): 7 days Date extubated: 12/11/14 Behavior/Cognition: Alert;Cooperative;Pleasant mood Oral Cavity - Dentition: Adequate natural dentition/normal for age Self-Feeding Abilities: Able to feed self Patient Positioning: Upright in bed Baseline Vocal Quality: Normal;Low vocal intensity Volitional Cough: Weak Volitional Swallow: Able to elicit    Oral/Motor/Sensory Function Overall Oral Motor/Sensory Function: Appears within functional limits for tasks assessed   Ice Chips Ice chips: Within functional limits Presentation: Spoon   Thin Liquid Thin Liquid: Not tested    Nectar Thick Nectar Thick Liquid: Impaired Presentation: Cup;Self Fed Oral phase functional implications: Prolonged oral transit Pharyngeal Phase Impairments: Suspected delayed Swallow (audible swallow)   Honey Thick Honey Thick Liquid: Not tested   Puree Puree: Within functional limits Presentation: Self Fed;Spoon   Solid      Thank you,  Havery Moros, CCC-SLP [phone]  Solid: Impaired Presentation: Spoon Oral Phase Impairments:  (delayed) Pharyngeal Phase Impairments: Suspected delayed Swallow        PORTER,DABNEY 12/11/2014,5:51 PM

## 2014-12-11 NOTE — Progress Notes (Signed)
Pt has had a stroke and he gag reflex and cough is'nt good

## 2014-12-11 NOTE — Progress Notes (Signed)
ANTIBIOTIC CONSULT NOTE - FOLLOW UP  Pharmacy Consult for Vancomycin and Zosyn  Indication: pneumonia  Allergies  Allergen Reactions  . Clarithromycin Anaphylaxis  . Levofloxacin Anaphylaxis  . Metronidazole Anaphylaxis  . Sulfonamide Derivatives Anaphylaxis  . Amoxicillin-Pot Clavulanate     Heart palpitations  . Augmentin [Amoxicillin-Pot Clavulanate] Other (See Comments)    Heart palpatation  . Clindamycin/Lincomycin Diarrhea  . Lansoprazole     SOB, heart racing when took Prevpac  . Metronidazole   . Sulfamethoxazole-Trimethoprim Swelling    Patient Measurements: Height:  (180.3 cm) Weight: 239 lb 10.2 oz (108.7 kg) IBW/kg (Calculated) : 70.8   Vital Signs: Temp: 97.4 F (36.3 C) (07/11 0809) Temp Source: Oral (07/11 0809) BP: 135/85 mmHg (07/11 0951) Pulse Rate: 84 (07/11 0900) Intake/Output from previous day: 07/10 0701 - 07/11 0700 In: 1543.7 [I.V.:358.7; NG/GT:935; IV Piggyback:250] Out: 1100 [Urine:1100] Intake/Output from this shift: Total I/O In: 60 [I.V.:20; NG/GT:40] Out: -   Labs:  Recent Labs  12/09/14 1809 12/10/14 0007 12/10/14 0445  WBC 12.5* 21.8* 20.4*  HGB 13.1 13.6 13.1  PLT 169 227 180  CREATININE  --   --  1.14*   Estimated Creatinine Clearance: 64.1 mL/min (by C-G formula based on Cr of 1.14).  Recent Labs  12/10/14 0445 12/11/14 0458  VANCORANDOM 27 20     Microbiology: Recent Results (from the past 720 hour(s))  Urine culture     Status: None   Collection Time: 11/17/14  6:35 AM  Result Value Ref Range Status   Specimen Description URINE, CATHETERIZED  Final   Special Requests NONE  Final   Culture   Final    NO GROWTH 2 DAYS Performed at [facility]    Report Status 11/19/2014 FINAL  Final  Blood culture (routine x 2)     Status: None   Collection Time: 12/04/14  4:00 AM  Result Value Ref Range Status   Specimen Description RIGHT ANTECUBITAL  Final   Special Requests BOTTLES DRAWN AEROBIC ONLY  7CC  Final   Culture NO GROWTH 5 DAYS  Final   Report Status 12/09/2014 FINAL  Final  Blood culture (routine x 2)     Status: None   Collection Time: 12/04/14  4:16 AM  Result Value Ref Range Status   Specimen Description RIGHT ANTECUBITAL  Final   Special Requests BOTTLES DRAWN AEROBIC ONLY 6CC  Final   Culture NO GROWTH 5 DAYS  Final   Report Status 12/09/2014 FINAL  Final  MRSA PCR Screening     Status: None   Collection Time: 12/04/14  4:24 AM  Result Value Ref Range Status   MRSA by PCR NEGATIVE NEGATIVE Final    Comment:        The GeneXpert MRSA Assay (FDA approved for NASAL specimens only), is one component of a comprehensive MRSA colonization surveillance program. It is not intended to diagnose MRSA infection nor to guide or monitor treatment for MRSA infections.     Anti-infectives    Start     Dose/Rate Route Frequency Ordered Stop   12/07/14 1000  fluconazole (DIFLUCAN) IVPB 100 mg     100 mg 50 mL/hr over 60 Minutes Intravenous Every 24 hours 12/07/14 0855 12/08/14 1106   12/04/14 1800  vancomycin (VANCOCIN) 1,250 mg in sodium chloride 0.9 % 250 mL IVPB  Status:  Discontinued     1,250 mg 166.7 mL/hr over 90 Minutes Intravenous Every 12 hours 12/04/14 0816 12/08/14 1148   12/04/14 1400  piperacillin-tazobactam (ZOSYN) IVPB 3.375 g     3.375 g 12.5 mL/hr over 240 Minutes Intravenous Every 8 hours 12/04/14 0817     12/04/14 1000  fluconazole (DIFLUCAN) tablet 100 mg  Status:  Discontinued     100 mg Oral Daily 12/04/14 0454 12/07/14 0855   12/04/14 0530  vancomycin (VANCOCIN) IVPB 1000 mg/200 mL premix     1,000 mg 200 mL/hr over 60 Minutes Intravenous  Once 12/04/14 0524 12/04/14 0704   12/04/14 0330  clindamycin (CLEOCIN) IVPB 600 mg  Status:  Discontinued     600 mg 100 mL/hr over 30 Minutes Intravenous  Once 12/04/14 0315 12/04/14 0454   12/04/14 0330  vancomycin (VANCOCIN) IVPB 1000 mg/200 mL premix     1,000 mg 200 mL/hr over 60 Minutes Intravenous   Once 12/04/14 0315 12/04/14 0530   12/04/14 0330  piperacillin-tazobactam (ZOSYN) IVPB 3.375 g     3.375 g 100 mL/hr over 30 Minutes Intravenous  Once 12/04/14 0315 12/04/14 0500     Assessment: Okay for Protocol. Acute hypoxic, hypercapnic respiratory failure with respiratory acidosis secondary to aspiration pneumonia. Intubated 7/6, suspect aspiration event.  Extubated 7/11, random level this AM now approaching goal trough level (patient has lower than expected drug clearance).  Micro (-)  Goal of Therapy:  Vancomycin trough level 15-20 mcg/ml  Plan:  Continue Zosyn 3.375gm IV every 8 hours. Follow-up micro data, labs, vitals.  Vancomycin 1gm IV x 1, with patient's clearance this will likely be the last dose she will need unless unexpected extended therapy planned. Repeat random level in two days if therapy to continue.   Lamonte RicherHayes, Tena Linebaugh R 12/11/2014,10:48 AM

## 2014-12-11 NOTE — Progress Notes (Signed)
Nutrition Follow-up   INTERVENTION: Advance diet as medically appropriate. Pt will need oral nutrition supplement and protein modular once she is tolerating clear liquids. Will continue to follow diet progression/tolerance.   NUTRITION DIAGNOSIS: Inadequate oral intake related to swallow difficulty as evidenced by pt hx of stroke and texture modified diet with thickened liquids   GOAL: Pt to advance diet (Dysphagia 3 with nectar-thick liquids) and meet 100% of protein and 75% energy needs daily.    MONITOR: diet advancement as appropriate, percent meal intake, labs and wt trends       ASSESSMENT: Severe weight loss over the past year per hospital records. Hx of CHF and thyroid mass may be contributing??  Pt extubated this morning and therefore tube feeding has been discontinued.   Pt previously on Dysphagia 3 with nectar-thick liquids and last recorded po meal intake on 7/5-(30%). Suspect inadequate oral intake. Discussed with nursing, pt will be started on ice chips. Will add oral supplement as pt is able to tolerate.  Pt has not had a BM since 7/6???  CBG's 193-237 mg/dl. Diabetes coordinator following.  Height:  Ht Readings from Last 1 Encounters:  12/08/14 5\' 11"  (1.803 m)    Weight:  Wt Readings from Last 1 Encounters:  12/11/14 239 lb 10.2 oz (108.7 kg)    Ideal Body Weight:  70.45 kg  Wt Readings from Last 10 Encounters:  12/11/14 239 lb 10.2 oz (108.7 kg)  11/30/14 242 lb 9.6 oz (110.043 kg)  11/17/14 260 lb (117.935 kg)  10/18/14 260 lb (117.935 kg)  09/15/14 264 lb (119.75 kg)  07/24/14 274 lb (124.286 kg)  03/18/14 289 lb 3.9 oz (131.2 kg)  02/08/14 292 lb (132.45 kg)  02/03/14 292 lb (132.45 kg)  01/04/14 296 lb 1.6 oz (134.31 kg)    BMI:  Body mass index is 33.44 kg/(m^2). Obesity class I  Estimated Nutritional Needs:  Kcal:  1795   Protein:  140 gr  Fluid:  >1.8 liters daily  Skin:   intact  Diet Order:  Diet NPO time  specified  EDUCATION NEEDS: none at this time    Intake/Output Summary (Last 24 hours) at 12/11/14 0837 Last data filed at 12/11/14 0800  Gross per 24 hour  Intake 1603.71 ml  Output   1100 ml  Net 503.71 ml    Last BM:  7/6 watery stool  Royann ShiversLynn Kelson Queenan MS,RD,CSG,LDN Office: [phone] Pager: [phone]

## 2014-12-11 NOTE — Progress Notes (Signed)
PROGRESS NOTE  Samantha [name] QQV:[id number] DOB: 1945-09-16 DOA: 12/02/2014 PCP: Colette Ribas, MD  Summary: 69 year old woman with history of COPD, dysphagia presented with shortness of breath and hypoxic respiratory failure requiring high flow oxygen. CT chest revealed aspiration pneumonia. Treated with IV abx with some improvement. Subsequently decompensated with severe hypoxia and was intubated.  Assessment/Plan: 1. Acute hypoxic, hypoxemic respiratory failure secondary to aspiration pneumonia. Intubated 7/6, suspect aspiration event. Extubated 7/11. S/p spontaneous LLL collapse 7/9 which is gradually improving. 2. Aspiration pneumonia. Afebrile, hemodynamics stable. CT negative for PE. Improving radiographically. 3. COPD, appears stable. 4. Chronic dysphagia. Last admission recommendations per speech therapy: Dysphagia 3 diet with nectar thick liquids, no straws. 5. Hypothyroidism. Solitary 2.9 cm right thyroid mass found on ultrasound. TSH WNL. Recommend outpatient FNA. 6. DM. Remains stable.    Overall appears to be improving. Status post extubation this morning. Continue to monitor.  Continue antibiotics.  Speech therapy consultation.  Remain in ICU.  Code Status: full code DVT prophylaxis: Lovenox Family Communication:  Disposition Plan: hopefully return to [facility]  Brendia Sacks, MD  [facility]  Pager [phone] If 7PM-7AM, please contact night-coverage at [email or web address], password [facility] 12/11/2014, 8:00 AM  LOS: 7 days   Consultants:    Procedures:  ETT 7/6 >> 7/11  PICC 7/10 >>  Antibiotics:  Vancomycin 7/4 >>  Zosyn 7/4 >>  HPI/Subjective: No issues overnight. Intubated but communicates with gestures. Seems to be feeling well.  Objective: Filed Vitals:   12/11/14 0530 12/11/14 0600 12/11/14 0630 12/11/14 0738  BP: 135/73 170/92 129/71   Pulse: 70 81 77   Temp:      TempSrc:      Resp: Height:      Weight: 108.7 kg  (239 lb 10.2 oz)     SpO2: 97% 98% 99% 100%    Intake/Output Summary (Last 24 hours) at 12/11/14 0800 Last data filed at 12/11/14 0600  Gross per 24 hour  Intake 1543.71 ml  Output   1100 ml  Net 443.71 ml     Filed Weights   12/09/14 0400 12/10/14 0600 12/11/14 0530  Weight: 108.3 kg (238 lb 12.1 oz) 108.9 kg (240 lb 1.3 oz) 108.7 kg (239 lb 10.2 oz)    Exam:    Afebrile, VSS, FiO2 40%, SpO2 93% General:  Appears calm and comfortable, intubated. Sitting up in bed. Eyes: PERRL, normal lids, irises  ENT: grossly normal hearing, lips  Cardiovascular: RRR, no m/r/g. No LE edema. Telemetry: SR, no arrhythmias  Respiratory: CTA bilaterally, no w/r/r. Normal respiratory effort. Musculoskeletal: grossly normal tone BUE/LLE. Right lower extremity exam unchanged. Psychiatric: grossly normal mood and affect, communicates with gestures. Neurologic: unchanged.  New data reviewed:  UOP 1100  ABG 7.393/37/100  CXR 7/11: my interpretation, improved left base infiltrate and aeration of left lung. Per radiologist no significant change.  Pertinent data since admission:  Hemoglobin A1c 6.8  CT angiogram of the chest 7/4: No PE. Left lower lobe airspace opacification concerning for aspiration. Right thyroid lobe mass seen.  BC no growth, final  Pending data:    Scheduled Meds: . antiseptic oral rinse  7 mL Mouth Rinse QID  . carvedilol  12.5 mg Per Tube BID WC  . chlorhexidine  15 mL Mouth Rinse BID  . famotidine (PEPCID) IV  20 mg Intravenous Q12H  . feeding supplement (VITAL HIGH PROTEIN)  1,000 mL Per Tube Q24H  . furosemide  40 mg Per Tube  Daily  . insulin aspart  0-15 Units Subcutaneous 6 times per day  . ipratropium-albuterol  3 mL Nebulization Q4H  . levothyroxine  112 mcg Per Tube QAC breakfast  . methylPREDNISolone (SOLU-MEDROL) injection  40 mg Intravenous Q12H  . piperacillin-tazobactam (ZOSYN)  IV  3.375 g Intravenous Q8H  . polyethylene glycol  17 g Per Tube  Daily  . potassium chloride  20 mEq Per Tube BID  . sodium chloride  10-40 mL Intracatheter Q12H   Continuous Infusions: . sodium chloride 10 mL/hr at 12/10/14 2200  . fentaNYL infusion INTRAVENOUS 100 mcg/hr (12/10/14 2245)    Principal Problem:   Acute respiratory failure with hypoxia Active Problems:   Essential hypertension   DM type 2 (diabetes mellitus, type 2)   Aspiration pneumonia   Thyroid mass   Time spent 20 minutes

## 2014-12-11 NOTE — Procedures (Signed)
Extubation Procedure Note  Patient Details:   Name: Samantha [name] DOB: [birth year] MRN: [id number]   Airway Documentation:  Airway 7.5 mm (Active)  Secured at (cm) 23 cm 12/11/2014  7:40 AM  Measured From Teeth 12/11/2014  7:40 AM  Secured Location Center 12/11/2014  7:40 AM  Secured By [employer] Tube Holder 12/11/2014  7:40 AM  Tube Holder Repositioned Yes 12/11/2014  7:40 AM  Cuff Pressure (cm H2O) 22 cm H2O 12/10/2014  8:21 PM  Site Condition Dry 12/11/2014  7:40 AM    Evaluation  O2 sats: stable throughout Complications: No apparent complications Patient did tolerate procedure well. Bilateral Breath Sounds: Clear Suctioning: Oral Yes  Katheren Shamsowell, Rhianna Raulerson Farmer 12/11/2014, 9:49 AM

## 2014-12-11 NOTE — Progress Notes (Addendum)
Inpatient Diabetes Program Recommendations  AACE/ADA: New Consensus Statement on Inpatient Glycemic Control (2013)  Target Ranges:  Prepandial:   less than 140 mg/dL      Peak postprandial:   less than 180 mg/dL (1-2 hours)      Critically ill patients:  140 - 180 mg/dL   Review of Glycemic Control:  Results for Samantha [name], [name] (MRN [id number]) as of 12/11/2014 11:26  Ref. Range 12/10/2014 15:32 12/10/2014 20:02 12/10/2014 23:27 12/11/2014 03:53 12/11/2014 07:30  Glucose-Capillary Latest Ref Range: 65-99 mg/dL 191193 (H) 478220 (H) 295206 (H) 237 (H) 227 (H)   Diabetes history: Type 2 diabetes Outpatient Diabetes medications: None ordered Current orders for Inpatient glycemic control:  Novolog moderate q 4 hours  Consider adding Levemir 10 units daily due to elevated blood sugars.  Thanks, Beryl MeagerJenny Romar Woodrick, RN, BC-ADM Inpatient Diabetes Coordinator Pager [phone] (8a-5p)

## 2014-12-11 NOTE — Progress Notes (Signed)
Pt extubated per Dr, Juanetta GoslingHawkins. NIF -20, FVC 1.0L, Pt followed simple directions, leak test negative ABG in normal range. Pt is on 4L [place] with SATS 99% and HR 101.. Pt stable and in good spirits.

## 2014-12-12 ENCOUNTER — Inpatient Hospital Stay (HOSPITAL_COMMUNITY): Payer: Commercial Managed Care - HMO

## 2014-12-12 ENCOUNTER — Encounter (HOSPITAL_COMMUNITY): Admission: EM | Disposition: E | Payer: Self-pay | Source: Home / Self Care | Attending: Family Medicine

## 2014-12-12 DIAGNOSIS — J9602 Acute respiratory failure with hypercapnia: Secondary | ICD-10-CM

## 2014-12-12 DIAGNOSIS — J9809 Other diseases of bronchus, not elsewhere classified: Secondary | ICD-10-CM

## 2014-12-12 DIAGNOSIS — T17500A Unspecified foreign body in bronchus causing asphyxiation, initial encounter: Secondary | ICD-10-CM

## 2014-12-12 DIAGNOSIS — I4891 Unspecified atrial fibrillation: Secondary | ICD-10-CM

## 2014-12-12 LAB — GLUCOSE, CAPILLARY
GLUCOSE-CAPILLARY: 194 mg/dL — AB (ref 65–99)
GLUCOSE-CAPILLARY: 170 mg/dL — AB (ref 65–99)
Glucose-Capillary: 153 mg/dL — ABNORMAL HIGH (ref 65–99)
Glucose-Capillary: 212 mg/dL — ABNORMAL HIGH (ref 65–99)

## 2014-12-12 LAB — BLOOD GAS, ARTERIAL
Acid-base deficit: 0.7 mmol/L (ref 0.0–2.0)
Bicarbonate: 24.2 mEq/L — ABNORMAL HIGH (ref 20.0–24.0)
Drawn by: 317771
O2 CONTENT: 4 L/min
O2 Saturation: 90.1 %
PCO2 ART: 45.5 mmHg — AB (ref 35.0–45.0)
Patient temperature: 37
TCO2: 22 mmol/L (ref 0–100)
pH, Arterial: 7.346 — ABNORMAL LOW (ref 7.350–7.450)
pO2, Arterial: 62.8 mmHg — ABNORMAL LOW (ref 80.0–100.0)

## 2014-12-12 LAB — CBC WITH DIFFERENTIAL/PLATELET
Basophils Absolute: 0 10*3/uL (ref 0.0–0.1)
Basophils Relative: 0 % (ref 0–1)
Eosinophils Absolute: 0 10*3/uL (ref 0.0–0.7)
Eosinophils Relative: 0 % (ref 0–5)
HCT: 39.4 % (ref 36.0–46.0)
HEMOGLOBIN: 12.1 g/dL (ref 12.0–15.0)
LYMPHS ABS: 0.8 10*3/uL (ref 0.7–4.0)
Lymphocytes Relative: 4 % — ABNORMAL LOW (ref 12–46)
MCH: 26.8 pg (ref 26.0–34.0)
MCHC: 30.7 g/dL (ref 30.0–36.0)
MCV: 87.4 fL (ref 78.0–100.0)
Monocytes Absolute: 0.8 10*3/uL (ref 0.1–1.0)
Monocytes Relative: 4 % (ref 3–12)
NEUTROS ABS: 17.4 10*3/uL — AB (ref 1.7–7.7)
NEUTROS PCT: 92 % — AB (ref 43–77)
Platelets: 278 10*3/uL (ref 150–400)
RBC: 4.51 MIL/uL (ref 3.87–5.11)
RDW: 15.9 % — ABNORMAL HIGH (ref 11.5–15.5)
WBC: 19 10*3/uL — AB (ref 4.0–10.5)

## 2014-12-12 LAB — COMPREHENSIVE METABOLIC PANEL
ALBUMIN: 2.8 g/dL — AB (ref 3.5–5.0)
ALT: 24 U/L (ref 14–54)
AST: 12 U/L — ABNORMAL LOW (ref 15–41)
Alkaline Phosphatase: 47 U/L (ref 38–126)
Anion gap: 9 (ref 5–15)
BUN: 47 mg/dL — ABNORMAL HIGH (ref 6–20)
CALCIUM: 8.4 mg/dL — AB (ref 8.9–10.3)
CO2: 25 mmol/L (ref 22–32)
Chloride: 109 mmol/L (ref 101–111)
Creatinine, Ser: 0.96 mg/dL (ref 0.44–1.00)
GFR calc Af Amer: >60 mL/min (ref >60)
GFR calc non Af Amer: 59 mL/min — ABNORMAL LOW (ref >60)
Glucose, Bld: 289 mg/dL — ABNORMAL HIGH (ref 65–99)
POTASSIUM: 4 mmol/L (ref 3.5–5.1)
Sodium: 143 mmol/L (ref 135–145)
Total Bilirubin: 0.7 mg/dL (ref 0.3–1.2)
Total Protein: 6.2 g/dL — ABNORMAL LOW (ref 6.5–8.1)

## 2014-12-12 LAB — MAGNESIUM: Magnesium: 1.8 mg/dL (ref 1.7–2.4)

## 2014-12-12 SURGERY — BRONCHOSCOPY, FLEXIBLE
Anesthesia: Moderate Sedation | Laterality: Bilateral

## 2014-12-12 MED ORDER — CARVEDILOL 12.5 MG PO TABS
25.0000 mg | ORAL_TABLET | Freq: Two times a day (BID) | ORAL | Status: DC
  Administered 2014-12-12 – 2014-12-13 (×2): 25 mg via ORAL
  Filled 2014-12-12 (×2): qty 2

## 2014-12-12 MED ORDER — ACETYLCYSTEINE 20 % IN SOLN
2.0000 mL | RESPIRATORY_TRACT | Status: AC
  Administered 2014-12-12: 2 mL via RESPIRATORY_TRACT
  Filled 2014-12-12 (×2): qty 4

## 2014-12-12 MED ORDER — ACETYLCYSTEINE 10 % IN SOLN
4.0000 mL | RESPIRATORY_TRACT | Status: AC
  Administered 2014-12-12: 4 mL via RESPIRATORY_TRACT
  Filled 2014-12-12 (×3): qty 4

## 2014-12-12 MED ORDER — METOPROLOL TARTRATE 1 MG/ML IV SOLN
2.5000 mg | INTRAVENOUS | Status: DC | PRN
  Administered 2014-12-12: 5 mg via INTRAVENOUS
  Administered 2014-12-12: 2.5 mg via INTRAVENOUS
  Filled 2014-12-12 (×2): qty 5

## 2014-12-12 MED ORDER — LIDOCAINE VISCOUS 2 % MT SOLN
OROMUCOSAL | Status: AC
  Filled 2014-12-12: qty 15

## 2014-12-12 MED ORDER — LIDOCAINE HCL 2 % EX GEL
CUTANEOUS | Status: AC
Start: 2014-12-12 — End: 2014-12-12
  Filled 2014-12-12: qty 30

## 2014-12-12 MED ORDER — MORPHINE SULFATE 2 MG/ML IJ SOLN
1.0000 mg | INTRAMUSCULAR | Status: DC | PRN
  Administered 2014-12-12 – 2014-12-13 (×6): 1 mg via INTRAVENOUS
  Filled 2014-12-12 (×6): qty 1

## 2014-12-12 MED ORDER — FUROSEMIDE 10 MG/ML IJ SOLN
20.0000 mg | Freq: Every day | INTRAMUSCULAR | Status: DC
  Administered 2014-12-12 – 2014-12-14 (×3): 20 mg via INTRAVENOUS
  Filled 2014-12-12 (×3): qty 2

## 2014-12-12 MED ORDER — LIDOCAINE HCL (PF) 2 % IJ SOLN
INTRAMUSCULAR | Status: AC
Start: 2014-12-12 — End: 2014-12-12
  Filled 2014-12-12: qty 20

## 2014-12-12 NOTE — Progress Notes (Signed)
Dr Selena BattenKim called back and asked to take a look at the chest film that was completed on the patient; the entire left lung is white out on the film; Dr Selena BattenKim suggested called Dr Vassie LollAlva to see what recommendations he had at this time

## 2014-12-12 NOTE — Progress Notes (Signed)
She has had ongoing discussions with her family and with Dr. Irene LimboGoodrich. After consideration she has decided that she does not want to proceed with bronchoscopy. I told her that I think that is a reasonable decision and she understands that she may not survive this current admission. She wants comfort care which I think is appropriate. Her family was present for the discussion.

## 2014-12-12 NOTE — Progress Notes (Signed)
Subjective: She was able to be extubated yesterday but has had continued problems with respiratory arrest through the night. Chest x-ray now shows that she has almost a complete white out of the left lung. Her cough effort is poor and I think she probably has mucus plugging. I discussed this with her. She does not want to be reintubated. I told her we can do bronchoscopy but with her respiratory status there is significant risk. She wants to proceed.  Objective: Vital signs in last 24 hours: Temp:  [97.4 F (36.3 C)-98.2 F (36.8 C)] 97.4 F (36.3 C) (07/12 0400) Pulse Rate:  [28-139] 85 (07/12 0630) Resp:  [13-28] 20 (07/12 0630) BP: (79-177)/(50-129) 117/97 mmHg (07/12 0630) SpO2:  [86 %-99 %] 96 % (07/12 0800) FiO2 (%):  [60 %] 60 % (07/12 0328) Weight:  [107.6 kg (237 lb 3.4 oz)] 107.6 kg (237 lb 3.4 oz) (07/12 0500) Weight change: -1.1 kg (-2 lb 6.8 oz) Last BM Date: 12/11/14  Intake/Output from previous day: 07/11 0701 - 07/12 0700 In: 651.9 [I.V.:361.9; NG/GT:40; IV Piggyback:250] Out: 1850 [Urine:1850]  PHYSICAL EXAM General appearance: alert, cooperative and moderate distress Resp: rhonchi bilaterally Cardio: regular rate and rhythm, S1, S2 normal, no murmur, click, rub or gallop GI: soft, non-tender; bowel sounds normal; no masses,  no organomegaly Extremities: extremities normal, atraumatic, no cyanosis or edema  Lab Results:  Results for orders placed or performed during the hospital encounter of 12/16/2014 (from the past 48 hour(s))  Glucose, capillary     Status: Abnormal   Collection Time: 12/10/14 11:10 AM  Result Value Ref Range   Glucose-Capillary 204 (H) 65 - 99 mg/dL  Glucose, capillary     Status: Abnormal   Collection Time: 12/10/14  3:32 PM  Result Value Ref Range   Glucose-Capillary 193 (H) 65 - 99 mg/dL   Comment 1 Notify RN   Glucose, capillary     Status: Abnormal   Collection Time: 12/10/14  8:02 PM  Result Value Ref Range   Glucose-Capillary 220  (H) 65 - 99 mg/dL   Comment 1 Notify RN    Comment 2 Document in Chart   Glucose, capillary     Status: Abnormal   Collection Time: 12/10/14 11:27 PM  Result Value Ref Range   Glucose-Capillary 206 (H) 65 - 99 mg/dL  Glucose, capillary     Status: Abnormal   Collection Time: 12/11/14  3:53 AM  Result Value Ref Range   Glucose-Capillary 237 (H) 65 - 99 mg/dL   Comment 1 Notify RN   Vancomycin, random     Status: None   Collection Time: 12/11/14  4:58 AM  Result Value Ref Range   Vancomycin Rm 20 ug/mL    Comment:        Random Vancomycin therapeutic range is dependent on dosage and time of specimen collection. A peak range is 20.0-40.0 ug/mL A trough range is 5.0-15.0 ug/mL          Blood gas, arterial     Status: None   Collection Time: 12/11/14  5:30 AM  Result Value Ref Range   FIO2 40.00 %   Delivery systems VENTILATOR    Mode PRESSURE REGULATED VOLUME CONTROL    VT 560 mL   Rate 12 resp/min   Peep/cpap 10.0 cm H20   pH, Arterial 7.393 7.350 - 7.450   pCO2 arterial 37.6 35.0 - 45.0 mmHg   pO2, Arterial 100 80.0 - 100.0 mmHg   Bicarbonate 22.4 20.0 - 24.0  mEq/L   TCO2 20.4 0 - 100 mmol/L   Acid-base deficit 1.7 0.0 - 2.0 mmol/L   O2 Saturation 97.1 %   Patient temperature 37.0    Collection site LEFT RADIAL    Drawn by [id number]    Sample type ARTERIAL    Allens test (pass/fail) PASS PASS  Glucose, capillary     Status: Abnormal   Collection Time: 12/11/14  7:30 AM  Result Value Ref Range   Glucose-Capillary 227 (H) 65 - 99 mg/dL   Comment 1 Notify RN    Comment 2 Document in Chart   Glucose, capillary     Status: Abnormal   Collection Time: 12/11/14 11:26 AM  Result Value Ref Range   Glucose-Capillary 190 (H) 65 - 99 mg/dL   Comment 1 Notify RN    Comment 2 Document in Chart   Glucose, capillary     Status: Abnormal   Collection Time: 12/11/14  4:50 PM  Result Value Ref Range   Glucose-Capillary 154 (H) 65 - 99 mg/dL   Comment 1 Notify RN    Comment 2  Document in Chart   Glucose, capillary     Status: Abnormal   Collection Time: 12/11/14  7:33 PM  Result Value Ref Range   Glucose-Capillary 164 (H) 65 - 99 mg/dL  Glucose, capillary     Status: Abnormal   Collection Time: 12/11/14 11:59 PM  Result Value Ref Range   Glucose-Capillary 153 (H) 65 - 99 mg/dL  Blood gas, arterial     Status: Abnormal   Collection Time: 12/07/2014  2:00 AM  Result Value Ref Range   O2 Content 4.0 L/min   Delivery systems NASAL CANNULA    pH, Arterial 7.346 (L) 7.350 - 7.450   pCO2 arterial 45.5 (H) 35.0 - 45.0 mmHg   pO2, Arterial 62.8 (L) 80.0 - 100.0 mmHg   Bicarbonate 24.2 (H) 20.0 - 24.0 mEq/L   TCO2 22.0 0 - 100 mmol/L   Acid-base deficit 0.7 0.0 - 2.0 mmol/L   O2 Saturation 90.1 %   Patient temperature 37.0    Collection site RIGHT RADIAL    Drawn by [id number]    Sample type ARTERIAL DRAW    Allens test (pass/fail) PASS PASS  Glucose, capillary     Status: Abnormal   Collection Time: 12/13/2014  4:40 AM  Result Value Ref Range   Glucose-Capillary 212 (H) 65 - 99 mg/dL  CBC with Differential/Platelet     Status: Abnormal   Collection Time: 12/02/2014  5:45 AM  Result Value Ref Range   WBC 19.0 (H) 4.0 - 10.5 K/uL   RBC 4.51 3.87 - 5.11 MIL/uL   Hemoglobin 12.1 12.0 - 15.0 g/dL   HCT 39.4 36.0 - 46.0 %   MCV 87.4 78.0 - 100.0 fL   MCH 26.8 26.0 - 34.0 pg   MCHC 30.7 30.0 - 36.0 g/dL   RDW 15.9 (H) 11.5 - 15.5 %   Platelets 278 150 - 400 K/uL   Neutrophils Relative % 92 (H) 43 - 77 %   Neutro Abs 17.4 (H) 1.7 - 7.7 K/uL   Lymphocytes Relative 4 (L) 12 - 46 %   Lymphs Abs 0.8 0.7 - 4.0 K/uL   Monocytes Relative 4 3 - 12 %   Monocytes Absolute 0.8 0.1 - 1.0 K/uL   Eosinophils Relative 0 0 - 5 %   Eosinophils Absolute 0.0 0.0 - 0.7 K/uL   Basophils Relative 0 0 - 1 %  Basophils Absolute 0.0 0.0 - 0.1 K/uL  Comprehensive metabolic panel     Status: Abnormal   Collection Time: 12/08/2014  5:45 AM  Result Value Ref Range   Sodium 143 135 - 145  mmol/L   Potassium 4.0 3.5 - 5.1 mmol/L   Chloride 109 101 - 111 mmol/L   CO2 25 22 - 32 mmol/L   Glucose, Bld 289 (H) 65 - 99 mg/dL   BUN 47 (H) 6 - 20 mg/dL   Creatinine, Ser 0.96 0.44 - 1.00 mg/dL   Calcium 8.4 (L) 8.9 - 10.3 mg/dL   Total Protein 6.2 (L) 6.5 - 8.1 g/dL   Albumin 2.8 (L) 3.5 - 5.0 g/dL   AST 12 (L) 15 - 41 U/L   ALT 24 14 - 54 U/L   Alkaline Phosphatase 47 38 - 126 U/L   Total Bilirubin 0.7 0.3 - 1.2 mg/dL   GFR calc non Af Amer 59 (L) >60 mL/min   GFR calc Af Amer >60 >60 mL/min    Comment: (NOTE) The eGFR has been calculated using the CKD EPI equation. This calculation has not been validated in all clinical situations. eGFR's persistently <60 mL/min signify possible Chronic Kidney Disease.    Anion gap 9 5 - 15  Magnesium     Status: None   Collection Time: 12/18/2014  5:45 AM  Result Value Ref Range   Magnesium 1.8 1.7 - 2.4 mg/dL  Glucose, capillary     Status: Abnormal   Collection Time: 12/04/2014  7:36 AM  Result Value Ref Range   Glucose-Capillary 194 (H) 65 - 99 mg/dL   Comment 1 Notify RN    Comment 2 Document in Chart     ABGS  Recent Labs  12/24/2014 0200  PHART 7.346*  PO2ART 62.8*  TCO2 22.0  HCO3 24.2*   CULTURES Recent Results (from the past 240 hour(s))  Blood culture (routine x 2)     Status: None   Collection Time: 12/04/14  4:00 AM  Result Value Ref Range Status   Specimen Description RIGHT ANTECUBITAL  Final   Special Requests BOTTLES DRAWN AEROBIC ONLY [place]  Final   Culture NO GROWTH 5 DAYS  Final   Report Status 12/09/2014 FINAL  Final  Blood culture (routine x 2)     Status: None   Collection Time: 12/04/14  4:16 AM  Result Value Ref Range Status   Specimen Description RIGHT ANTECUBITAL  Final   Special Requests BOTTLES DRAWN AEROBIC ONLY 6CC  Final   Culture NO GROWTH 5 DAYS  Final   Report Status 12/09/2014 FINAL  Final  MRSA PCR Screening     Status: None   Collection Time: 12/04/14  4:24 AM  Result Value Ref Range  Status   MRSA by PCR NEGATIVE NEGATIVE Final    Comment:        The GeneXpert MRSA Assay (FDA approved for NASAL specimens only), is one component of a comprehensive MRSA colonization surveillance program. It is not intended to diagnose MRSA infection nor to guide or monitor treatment for MRSA infections.    Studies/Results: Dg Chest Port 1 View  12/15/2014   CLINICAL DATA:  69 year old female with shortness of breath  EXAM: PORTABLE CHEST - 1 VIEW  COMPARISON:  Chest radiograph dated 12/11/2014  FINDINGS: There is near complete consolidation of the left lung, worsened compared to the prior study. Small aerated area is noted in the left apical region. There is elevation of the left hemidiaphragm. The  right lung is clear. The cardiac borders are silhouetted. The osseous structures are grossly unremarkable.  IMPRESSION: Near complete consolidation of the left lung.   Electronically Signed   By: Anner Crete M.D.   On: 12/29/2014 02:07   Dg Chest Port 1 View  12/11/2014   CLINICAL DATA:  69 year old female with shortness of breath and intubated. Possible aspiration.  EXAM: PORTABLE CHEST - 1 VIEW  COMPARISON:  Prior chest x-ray 12/10/2014  FINDINGS: The endotracheal tube is 4 cm above the carina. A nasogastric tube is present. The tip is not identified but lies below the diaphragm presumably within the stomach. A left upper extremity PICC is well positioned with the tip overlying the superior cavoatrial junction. Cardiac and mediastinal contours remain within normal limits. Atherosclerotic calcifications are present in the transverse aorta. Persistent elevation of the left hemidiaphragm with associated small left pleural effusion and left basilar atelectasis or consolidation. The right lung base is clear. No evidence of new right basilar infiltrate. No pneumothorax or pulmonary edema. No acute osseous abnormality.  IMPRESSION: 1. No significant interval change in the appearance of the chest. 2.  Persistent elevation of the left hemidiaphragm with associated left basilar atelectasis versus infiltrate. 3. Trace left pleural effusion suspected. 4. Stable and satisfactory support apparatus.   Electronically Signed   By: Jacqulynn Cadet M.D.   On: 12/11/2014 07:52   Dg Chest Port 1 View  12/10/2014   CLINICAL DATA:  PICC line placement  EXAM: PORTABLE CHEST - 1 VIEW  COMPARISON:  12/10/2014 at 0614 hr  FINDINGS: Patchy left lower lobe opacity, suspicious for pneumonia. No pleural effusion or pneumothorax.  Left arm PICC terminates at the cavoatrial junction.  The heart is normal in size.  Enteric tube courses into the stomach.  IMPRESSION: Patchy left lower lobe opacity, suspicious for pneumonia.  Left arm PICC terminates at the cavoatrial junction.   Electronically Signed   By: Julian Hy M.D.   On: 12/10/2014 15:34    Medications:  Prior to Admission:  Prescriptions prior to admission  Medication Sig Dispense Refill Last Dose  . Alum & Mag Hydroxide-Simeth (MAGIC MOUTHWASH) SOLN Take 10 mLs by mouth 3 (three) times daily.  0 12/04/2014 at Unknown time  . aspirin 81 MG tablet Take 162 mg by mouth daily.    12/04/2014 at Unknown time  . BREO ELLIPTA 100-25 MCG/INH AEPB Inhale 1 puff into the lungs daily.   12/04/2014 at Unknown time  . carvedilol (COREG) 25 MG tablet Take 1 tablet (25 mg total) by mouth 2 (two) times daily with a meal. 60 tablet 11 12/04/2014 at 0900  . Coenzyme Q10 (CO Q 10) 100 MG CAPS Take 100 mg by mouth 2 (two) times daily.    12/04/2014 at Unknown time  . ferrous sulfate 325 (65 FE) MG tablet Take 325 mg by mouth daily.   12/04/2014 at Unknown time  . fluconazole (DIFLUCAN) 100 MG tablet Take 1 tablet (100 mg total) by mouth daily. For 7 days 7 tablet 0 12/04/2014 at Unknown time  . folic acid (FOLVITE) 1 MG tablet Take 1 tablet (1 mg total) by mouth daily. 90 tablet 3 12/04/2014 at Unknown time  . furosemide (LASIX) 40 MG tablet Take 40 mg by mouth daily.    12/04/2014 at Unknown  time  . gabapentin (NEURONTIN) 100 MG capsule Take 100 mg by mouth 3 (three) times daily.   12/04/2014 at Unknown time  . levalbuterol (XOPENEX) 0.63 MG/3ML nebulizer solution Take  3 mLs by nebulization every 6 (six) hours as needed for wheezing or shortness of breath.    unknown  . levothyroxine (SYNTHROID, LEVOTHROID) 112 MCG tablet Take 112 mcg by mouth daily before breakfast.   12/04/2014 at Unknown time  . lidocaine (XYLOCAINE) 2 % solution Use as directed 10 mLs in the mouth or throat 3 (three) times daily. 100 mL 0 12/04/2014 at Unknown time  . niacin 500 MG tablet Take 500 mg by mouth at bedtime.    12/24/2014 at Unknown time  . ondansetron (ZOFRAN ODT) 8 MG disintegrating tablet Take 1 tablet (8 mg total) by mouth every 8 (eight) hours as needed. 72m ODT q4 hours prn nausea 12 tablet 0 unknown  . potassium chloride SA (K-DUR,KLOR-CON) 20 MEQ tablet Take 20 mEq by mouth 2 (two) times daily.   12/04/2014 at Unknown time  . pravastatin (PRAVACHOL) 80 MG tablet Take 1 tablet (80 mg total) by mouth daily. 90 tablet 3 12/09/2014 at Unknown time  . predniSONE (DELTASONE) 20 MG tablet Take 419mpo daily for 3 more days (Patient taking differently: Take 40 mg by mouth 3 (three) times daily. )   12/04/2014 at Unknown time  . ranitidine (ZANTAC) 150 MG tablet Take 150 mg by mouth at bedtime.   12/26/2014 at Unknown time  . SPIRIVA HANDIHALER 18 MCG inhalation capsule Take 18 mcg by mouth daily.   12/04/2014 at Unknown time  . ZETIA 10 MG tablet TAKE ONE TABLET BY MOUTH ONCE DAILY. 30 tablet 11 12/04/2014 at Unknown time   Scheduled: . acetylcysteine  4 mL Nebulization Q4H  . antiseptic oral rinse  7 mL Mouth Rinse QID  . carvedilol  25 mg Oral BID WC  . chlorhexidine  15 mL Mouth Rinse BID  . famotidine (PEPCID) IV  20 mg Intravenous Q12H  . furosemide  40 mg Oral Daily  . insulin aspart  0-15 Units Subcutaneous 6 times per day  . ipratropium-albuterol  3 mL Nebulization Q4H  . levothyroxine  112 mcg Oral QAC  breakfast  . methylPREDNISolone (SOLU-MEDROL) injection  40 mg Intravenous Q12H  . piperacillin-tazobactam (ZOSYN)  IV  3.375 g Intravenous Q8H  . polyethylene glycol  17 g Oral BID  . senna  1 tablet Oral QHS  . sodium chloride  10-40 mL Intracatheter Q12H   Continuous: . sodium chloride 10 mL/hr at 12/31/2014 0600  . diltiazem (CARDIZEM) infusion 15 mg/hr (12/13/2014 0600)   PRZOX:WRUEAVWUJLORazepam, metoprolol, ondansetron **OR** ondansetron (ZOFRAN) IV, sodium chloride  Assesment: She was admitted with acute hypoxic respiratory failure. She then aspirated requiring intubation and mechanical ventilation. She was able to be extubated yesterday but then has developed increasing problems and now has an essentially whited out left lung. I think she has mucus plugging. She may have food particles as well. She has developed atrial fibrillation and this is being treated Principal Problem:   Acute respiratory failure with hypoxia Active Problems:   Essential hypertension   DM type 2 (diabetes mellitus, type 2)   Aspiration pneumonia   Thyroid mass    Plan: Repeat chest x-ray now  bronchoscopy later today    LOS: 8 days   Samantha [name] L 12/24/2014, 8:11 AM

## 2014-12-12 NOTE — Progress Notes (Signed)
eLink Physician-Brief Progress Note Patient Name: Samantha RubensteinBarbara A Mareno DOB: 04-19-1946 MRN: [id number]   Date of Service  12/24/2014  HPI/Events of Note  AF-RVR, inspiet of max cardizem gtt Extubated today  eICU Interventions  Add lopressor prn Increase coreg to 25 bid - home dose     Intervention Category Intermediate Interventions: Arrhythmia - evaluation and management  Anouk Critzer V. 12/05/2014, 12:32 AM

## 2014-12-12 NOTE — Progress Notes (Signed)
eLink Physician-Brief Progress Note Patient Name: Samantha RubensteinBarbara A [name] DOB: [birth year] MRN: [id number]   Date of Service  12/31/2014  HPI/Events of Note  pCXR shows left atx Weak cough per RN AF-RVR  eICU Interventions  Mucomyst/vest Trial of bipap If am CXR does not show open lung, may need bronchoscopy-defer to rounding MD     Intervention Category Intermediate Interventions: Diagnostic test evaluation  [name] V. 12/27/2014, 2:06 AM

## 2014-12-12 NOTE — Progress Notes (Signed)
Coralie CarpenL Easterwood, RN paged for new onset A-Fib RVR with 12 lead already completed; orders received for Cardizem bolus and drip

## 2014-12-12 NOTE — Progress Notes (Signed)
[facility] updated on pt condition and that Cardizem drip was already on 15mg /hr; prn orders received from lopressor and coreg increased to reflect home dose

## 2014-12-12 NOTE — Progress Notes (Addendum)
PROGRESS NOTE  Samantha RubensteinBarbara A [name] ZDG:[id number] DOB: February 22, 1946 DOA: 12/13/2014 PCP: Colette RibasGOLDING, [name], MD  Summary: 69 year old woman with history of COPD, dysphagia presented with shortness of breath and hypoxic respiratory failure requiring high flow oxygen. CT chest revealed aspiration pneumonia. Treated with IV abx with some improvement. Subsequently decompensated with severe hypoxia and was intubated. She was maintained on antibiotics, steroids and her condition gradually improved although extubation was delayed by a weak cough and recurrent mucous plugging. She was extubated 7/11 appeared well but on the evening of 7/11 she developed atrial fibrillation with rapid ventricular response and increased hypoxia. Chest x-ray revealed near complete collapse of the left long, presumably secondary to mucus plugging. Her condition is guarded and she may not survive this hospitalization. Plans are for bronchoscopy today in an attempt to relieve obstruction.  Assessment/Plan: 1. Acute hypoxic, hypoxemic, hypercapnic respiratory failure secondary to aspiration pneumonia. Intubated 7/6, suspect aspiration event. Extubated 7/11. S/p spontaneous LLL collapse 7/9 which improved and then subsequently recurred 7/11. 2. Recurrent mucous plugging. Chest PT, mucolytics. Plan for bronchoscopy today. 3. Atrial fibrillation with rapid ventricular response. Secondary to acute lung issues. Anticipate rate will be difficult to control. Blood pressure is stable. Continue diltiazem infusion, the blockers. TSH was within normal limits. Hold off on echocardiogram sagittal think it will change management at this point. 4. Aspiration pneumonia. Afebrile, hemodynamics stable, this does not appear to be a driving force at this point. CT negative for PE.  5. COPD, appears stable. This does not appear to be a driving force at this point. 6. Chronic dysphagia. Speech therapy: Dysphagia 2 diet with nectar thick liquids, no  straws. 7. Hypothyroidism. Solitary 2.9 cm right thyroid mass found on ultrasound. TSH WNL. Recommend outpatient FNA. 8. DM. Stable.    Clearly worse today with near complete collapse of the left lung, increased hypoxia and increased respiratory effort. She is very weak and although she is responsive and follows commands she is clearly exhausted.  Plan to continue supplemental oxygen, conservative efforts in attempting to relieve mucus plugging with mucolytics, incentive spirometry and chest physiotherapy. Plans for bronchoscopy later today for pulmonology.  Her prognosis is poor and survival is in doubt. Will discuss with the patient and family at bedside goals of care, risk for reintubation is exceedingly high and apparently the patient expressed to Dr. Juanetta GoslingHawkins that she would not want this. She is too tired to talk about this now.  Continue rate control.  Continue empiric antibiotics.  ADDENDUM 1320 RN notified me that patient did not want to pursue bronchoscopy.  Discussed current issues with patient, husband, daughter, brother-in-law, Dr. Juanetta GoslingHawkins at bedside. Patient awake and alert, hoarse but easily communicates. We discussed guarded prognosis, possibility of reintubation, recurrent mucous plugs. She reports "I'm tired, let me die". Does not want bronchoscopy. Does not want intubation under any circumstances or CPR. Wants to be comfortable and pursue conservative measures with focus on comfort. She displays clear capacity and understands. Her husband and daughter agree with her decision, stating she has been suffering for a year. Discussed with Dr. Juanetta GoslingHawkins. Will respect patient's decision which family clearly supports. Will focus on comfort, continue supportive and conservative measures. Will follow course and consider hospice if appropriate  Code Status: DNR DVT prophylaxis: Lovenox Family Communication:  Disposition Plan: in hospital death likely  Brendia Sacksaniel Malak Orantes, MD  [facility]  Pager [phone] If 7PM-7AM, please contact night-coverage at [email or web address], password [email or web address] 12/17/2014, 8:16 AM  LOS: 8 days   Consultants:  Pulmonology   ST Dysphagia 2 (Fine chop);Nectar  Medication Administration: Crushed with puree Compensations: Slow rate;Small sips/bites;Multiple dry swallows after each bite/sip   Procedures:  ETT 7/6 >> 7/11  PICC 7/10 >>  Antibiotics:  Vancomycin 7/4 >> 7/13  Zosyn 7/4 >> 7/13  HPI/Subjective: Developed afib RVR last night, treated with dilt and Coreg and Lopressor. Increased hypoxia, CXR showed near complete collapse left lung c/w mucous plug Bronchoscopy planned later today.  Tolerated this morning. Short of breath.  Objective: Filed Vitals:   12/25/14 0615 25-Dec-2014 0630 December 25, 2014 0800 12/25/2014 0812  BP: 121/108 117/97    Pulse: 106 85    Temp:    97.5 F (36.4 C)  TempSrc:    Oral  Resp: 19 20    Height:      Weight:      SpO2: 94% 97% 96%     Intake/Output Summary (Last 24 hours) at 12-25-2014 0816 Last data filed at 25-Dec-2014 0600  Gross per 24 hour  Intake 591.92 ml  Output   1850 ml  Net -1258.08 ml     Filed Weights   12/10/14 0600 12/11/14 0530 2014/12/25 0500  Weight: 108.9 kg (240 lb 1.3 oz) 108.7 kg (239 lb 10.2 oz) 107.6 kg (237 lb 3.4 oz)    Exam:    Afebrile, VSS, 4L White 96% General:  Appears exhausted today. Shallow respirations. Increased respiratory effort. She appears critically ill. Eyes: Appear grossly normal ENT: grossly normal hearing, lips Cardiovascular: Irregular, tachycardic; no murmur, rub or gallop. No significant lower extremity edema. Telemetry: Atrial fibrillation with tachycardia Respiratory: Very poor air movement on the left with minimal breath sounds. Increased respiratory effort and shallow respirations. No frank wheezes, rales or rhonchi. Abdomen: soft, ntnd Skin: no rash or induration noted Musculoskeletal: Very weak but no gross change Psychiatric: grossly  normal mood and affect Neurologic: grossly unchanged  New data reviewed:  UOP 1850  ABG 7.346/45/62  CMP unremarkable  CBC without sig change  CXR 7/12: my interpretation, near whiteout left lung c/w mucous plugging; significant worsening since 7/11   EKG afib RVR  Pertinent data since admission:  CT angiogram of the chest 7/4: No PE. Left lower lobe airspace opacification concerning for aspiration. Right thyroid lobe mass seen.  BC no growth, final  Pending data:    Scheduled Meds: . acetylcysteine  4 mL Nebulization Q4H  . antiseptic oral rinse  7 mL Mouth Rinse QID  . carvedilol  25 mg Oral BID WC  . chlorhexidine  15 mL Mouth Rinse BID  . famotidine (PEPCID) IV  20 mg Intravenous Q12H  . furosemide  40 mg Oral Daily  . insulin aspart  0-15 Units Subcutaneous 6 times per day  . ipratropium-albuterol  3 mL Nebulization Q4H  . levothyroxine  112 mcg Oral QAC breakfast  . methylPREDNISolone (SOLU-MEDROL) injection  40 mg Intravenous Q12H  . piperacillin-tazobactam (ZOSYN)  IV  3.375 g Intravenous Q8H  . polyethylene glycol  17 g Oral BID  . senna  1 tablet Oral QHS  . sodium chloride  10-40 mL Intracatheter Q12H   Continuous Infusions: . sodium chloride 10 mL/hr at 2014-12-25 0600  . diltiazem (CARDIZEM) infusion 15 mg/hr (12-25-2014 0600)    Principal Problem:   Acute respiratory failure with hypoxia Active Problems:   Essential hypertension   DM type 2 (diabetes mellitus, type 2)   Aspiration pneumonia   Thyroid mass   Acute respiratory failure with hypercapnia   Mucus plugging of bronchi  Atrial fibrillation   Time spent 35 minutes

## 2014-12-12 NOTE — Clinical Social Work Note (Signed)
CSW updated [facility] on pt. Facility continues to be willing to accept pt when stable. CSW will continue to follow.  Derenda FennelKara Broadus Costilla, LCSW [phone]

## 2014-12-12 NOTE — Progress Notes (Signed)
Dr Vassie LollAlva at [facility] called with finding on chest film and asked for suggestions on treatment at this time; orders received for chest PT, mucomyst breathing treatments, and alternating bipap 2 hours on a 4 hours off; will continue to monitor and document any changes

## 2014-12-12 NOTE — Progress Notes (Signed)
Dr Selena BattenKim called with change in patient condition; lungs sounds very diminished on the left side upper and lower; patients O2 had to be increased to 4L via [place] with O2 sats 89-91%; orders approved for portable chext x-ray, ABG, and bipap; daughter and husband have been called to come to the hospital per the patients request; will continue to monitor

## 2014-12-12 NOTE — Care Management Note (Signed)
Case Management Note  Patient Details  Name: Samantha RubensteinBarbara A Pettus MRN: [id number] Date of Birth: 1946-03-26   Expected Discharge Date:                Expected Discharge Plan:    In-House Referral:  Clinical Social Work  Discharge planning Services  CM Consult  Post Acute Care Choice:  NA Choice offered to:  NA  DME Arranged:    DME Agency:     HH Arranged:    HH Agency:     Status of Service:  Completed, signed off  Medicare Important Message Given:  Yes-third notification given Date Medicare IM Given:    Medicare IM give by:    Date Additional Medicare IM Given:    Additional Medicare Important Message give by:     If discussed at Long Length of Stay Meetings, dates discussed:  12/26/14  Additional Comments:  Malcolm MetroChildress, Rollande Thursby Demske, RN 12/26/14, 3:28 PM

## 2014-12-13 ENCOUNTER — Encounter (HOSPITAL_COMMUNITY): Payer: Self-pay | Admitting: Primary Care

## 2014-12-13 DIAGNOSIS — Z7189 Other specified counseling: Secondary | ICD-10-CM | POA: Insufficient documentation

## 2014-12-13 DIAGNOSIS — Z515 Encounter for palliative care: Secondary | ICD-10-CM

## 2014-12-13 DIAGNOSIS — J9601 Acute respiratory failure with hypoxia: Secondary | ICD-10-CM

## 2014-12-13 DIAGNOSIS — I4891 Unspecified atrial fibrillation: Secondary | ICD-10-CM

## 2014-12-13 DIAGNOSIS — J9809 Other diseases of bronchus, not elsewhere classified: Secondary | ICD-10-CM

## 2014-12-13 DIAGNOSIS — J690 Pneumonitis due to inhalation of food and vomit: Principal | ICD-10-CM

## 2014-12-13 LAB — GLUCOSE, CAPILLARY
GLUCOSE-CAPILLARY: 248 mg/dL — AB (ref 65–99)
GLUCOSE-CAPILLARY: 305 mg/dL — AB (ref 65–99)
Glucose-Capillary: 205 mg/dL — ABNORMAL HIGH (ref 65–99)

## 2014-12-13 MED ORDER — LORAZEPAM 2 MG/ML IJ SOLN
1.0000 mg | INTRAMUSCULAR | Status: DC | PRN
  Filled 2014-12-13: qty 1

## 2014-12-13 MED ORDER — SODIUM CHLORIDE 0.9 % IV SOLN
2.0000 mg/h | INTRAVENOUS | Status: DC
  Administered 2014-12-13: 1 mg/h via INTRAVENOUS
  Administered 2014-12-13: 2 mg/h via INTRAVENOUS
  Administered 2014-12-13: 3 mg/h via INTRAVENOUS
  Administered 2014-12-13 (×3): 1 mg/h via INTRAVENOUS
  Filled 2014-12-13: qty 10

## 2014-12-13 MED ORDER — INSULIN ASPART 100 UNIT/ML ~~LOC~~ SOLN
0.0000 [IU] | Freq: Three times a day (TID) | SUBCUTANEOUS | Status: DC
  Administered 2014-12-13: 7 [IU] via SUBCUTANEOUS

## 2014-12-13 MED ORDER — LORAZEPAM 2 MG/ML IJ SOLN
1.0000 mg | INTRAMUSCULAR | Status: DC | PRN
  Administered 2014-12-13 (×2): 1 mg via INTRAVENOUS
  Filled 2014-12-13 (×2): qty 1

## 2014-12-13 MED ORDER — INSULIN ASPART 100 UNIT/ML ~~LOC~~ SOLN: 0.0000 [IU] | Freq: Every day | SUBCUTANEOUS | Status: DC

## 2014-12-13 MED ORDER — MORPHINE SULFATE 2 MG/ML IJ SOLN
2.0000 mg | INTRAMUSCULAR | Status: DC | PRN
  Administered 2014-12-13 (×2): 2 mg via INTRAVENOUS
  Filled 2014-12-13 (×2): qty 1

## 2014-12-13 MED ORDER — LORAZEPAM 2 MG/ML IJ SOLN
2.0000 mg | Freq: Four times a day (QID) | INTRAMUSCULAR | Status: DC
  Administered 2014-12-13 – 2014-12-14 (×3): 2 mg via INTRAVENOUS
  Filled 2014-12-13 (×2): qty 1

## 2014-12-13 NOTE — Progress Notes (Signed)
SLP Cancellation Note  Patient Details Name: Samantha [name] MRN: [id number] DOB: 07/09/1945   Cancelled treatment:       Reason Eval/Treat Not Completed: Other (comment); Acknowledge goals of care are comfort at this time. Please reconsult if needed.  Thank you,  Havery MorosDabney Porter, CCC-SLP [phone]    PORTER,DABNEY 12/13/2014, 4:14 PM

## 2014-12-13 NOTE — Progress Notes (Signed)
[facility] PROGRESS NOTE  Samantha RubensteinBarbara A [name] GEX:[id number] DOB: 09/08/1945 DOA: 12/02/2014 PCP: Colette RibasGOLDING, [name], MD  Assessment/Plan: 1. Acute on chronic hypoxic respiratory failure. Patient was intubated on 7/6 for suspected aspiration event. She was able to expedite on 7/11, but developed spontaneous left lower lobe collapse which is felt to be related to mucus plugging. She is currently on supplemental oxygen. Pulmonology is following. Bronchoscopy was offered, the patient has refused. Continue current treatments. 2. Aspiration pneumonia. Currently afebrile. She is on intravenous antibiotic. CT scan was negative for pulmonary embolus. 3. Atrial fibrillation with rapid ventricular response. Felt to be related to pulmonary issues. Currently on intravenous diltiazem. TSH was in normal limits. 4. Recurrent mucus plugging. Continue chest physical therapy and mucolytics. She has refused bronchoscopy. 5. COPD . appears stable. Continue steroids, antibiotic some bronchodilators. 6. Chronic dysphagia. Seen by speech therapy and recommendations are for dysphagia 2 diet with nectar thick liquids. 7. Hypothyroidism. Found to have a thyroid mass on ultrasound. TSH is within normal limits. Continue Synthroid. Current recommendations are for outpatient needle biopsy. 8. Diabetes. Currently stable. Continue sliding scale insulin 9. Discussion . patient has had an overall complicated course. She has significant respiratory issues with recurrent mucus plugging, weak cough reflex and acute respiratory failure. She has recurrent aspiration despite being on a modified diet. She does not wish aggressive treatment such as bronchoscopy. She understands that her overall clinical condition may not improve and that she may decline. She is currently a DO NOT RESUSCITATE. Will request palliative care consultation to further address goals of care and possibly transition a full comfort care. Continue morphine as needed for  pain and respiratory distress.  Code Status: DNR Family Communication: discussed with husband at the bedside Disposition Plan: pending hospital course   Consultants:  Pulmonology  Speech therapy  Procedures:  ETT 7/6>>7/11  PICC 7/10>>  Antibiotics:  zosyn 7/4>>  Vancomycin 7/4>>7/13  HPI/Subjective: Sedated, cannot participate in history  Objective: Filed Vitals:   12/13/14 0900  BP: 121/60  Pulse: 86  Temp:   Resp: 22    Intake/Output Summary (Last 24 hours) at 12/13/14 0936 Last data filed at 12/13/14 0517  Gross per 24 hour  Intake 2733.16 ml  Output   2275 ml  Net 458.16 ml   Filed Weights   12/11/14 0530 12/30/2014 0500 12/13/14 0500  Weight: 108.7 kg (239 lb 10.2 oz) 107.6 kg (237 lb 3.4 oz) 108 kg (238 lb 1.6 oz)    Exam:   General:  Sedated, no distress  Cardiovascular: s1, s2, rrr  Respiratory: diminished breath sounds bilaterally, crackles at bases  Abdomen: soft, nt, nondistended  Musculoskeletal: 1+ edema b/l   Data Reviewed: Basic Metabolic Panel:  Recent Labs Lab 12/06/14 2009 12/07/14 0514 12/08/14 0505 12/10/14 0445 12/05/2014 0545  NA 142 145 145 144 143  K 4.9 4.2 4.4 4.6 4.0  CL 105 107 111 113* 109  CO2 30 30 26 23 25   GLUCOSE 234* 112* 177* 215* 289*  BUN 43* 41* 39* 40* 47*  CREATININE 1.14* 1.14* 1.19* 1.14* 0.96  CALCIUM 7.9* 8.2* 8.1* 8.3* 8.4*  MG  --   --   --   --  1.8   Liver Function Tests:  Recent Labs Lab 12/06/14 2009 12/09/2014 0545  AST 32 12*  ALT 47 24  ALKPHOS 56 47  BILITOT 0.7 0.7  PROT 6.3* 6.2*  ALBUMIN 3.0* 2.8*   No results for input(s): LIPASE, AMYLASE in the last 168 hours.  No results for input(s): AMMONIA in the last 168 hours. CBC:  Recent Labs Lab 12/06/14 2009 12/07/14 0514 12/08/14 0520 12/09/14 1809 12/10/14 0007 12/10/14 0445 12/27/2014 0545  WBC 12.4* 14.1* 7.0 12.5* 21.8* 20.4* 19.0*  NEUTROABS 11.2* 10.2* 5.9  --   --   --  17.4*  HGB 12.1 11.5* 11.2* 13.1  13.6 13.1 12.1  HCT 40.8 38.3 35.9* 41.6 42.9 41.5 39.4  MCV 90.3 88.5 85.9 86.8 86.1 86.1 87.4  PLT 231 239 208 169 227 180 278   Cardiac Enzymes: No results for input(s): CKTOTAL, CKMB, CKMBINDEX, TROPONINI in the last 168 hours. BNP (last 3 results)  Recent Labs  10/18/14 1829 12/02/2014 2340  BNP 53.0 53.0    ProBNP (last 3 results) No results for input(s): PROBNP in the last 8760 hours.  CBG:  Recent Labs Lab 12/11/14 2359 12/29/2014 0440 12/15/2014 0736 12/13/2014 1119 12/13/14 0033  GLUCAP 153* 212* 194* 170* 205*    Recent Results (from the past 240 hour(s))  Blood culture (routine x 2)     Status: None   Collection Time: 12/04/14  4:00 AM  Result Value Ref Range Status   Specimen Description RIGHT ANTECUBITAL  Final   Special Requests BOTTLES DRAWN AEROBIC ONLY 7CC  Final   Culture NO GROWTH 5 DAYS  Final   Report Status 12/09/2014 FINAL  Final  Blood culture (routine x 2)     Status: None   Collection Time: 12/04/14  4:16 AM  Result Value Ref Range Status   Specimen Description RIGHT ANTECUBITAL  Final   Special Requests BOTTLES DRAWN AEROBIC ONLY 6CC  Final   Culture NO GROWTH 5 DAYS  Final   Report Status 12/09/2014 FINAL  Final  MRSA PCR Screening     Status: None   Collection Time: 12/04/14  4:24 AM  Result Value Ref Range Status   MRSA by PCR NEGATIVE NEGATIVE Final    Comment:        The GeneXpert MRSA Assay (FDA approved for NASAL specimens only), is one component of a comprehensive MRSA colonization surveillance program. It is not intended to diagnose MRSA infection nor to guide or monitor treatment for MRSA infections.      Studies: Dg Chest Port 1 View  12/18/2014   CLINICAL DATA:  Respiratory failure  EXAM: PORTABLE CHEST - 1 VIEW  COMPARISON:  12/23/2014  FINDINGS: The left arm PICC line tip is in the cavoatrial junction. There is asymmetric elevation of the left hemidiaphragm with left lung volume loss and airspace consolidation. The  appearance is similar to previous exam. Small right pleural effusion is suspected.  IMPRESSION: 1. Similar appearance of asymmetric airspace consolidation involving the left lung.   Electronically Signed   By: Signa Kell M.D.   On: 12/11/2014 10:18   Dg Chest Port 1 View  12/13/2014   CLINICAL DATA:  69 year old female with shortness of breath  EXAM: PORTABLE CHEST - 1 VIEW  COMPARISON:  Chest radiograph dated 12/11/2014  FINDINGS: There is near complete consolidation of the left lung, worsened compared to the prior study. Small aerated area is noted in the left apical region. There is elevation of the left hemidiaphragm. The right lung is clear. The cardiac borders are silhouetted. The osseous structures are grossly unremarkable.  IMPRESSION: Near complete consolidation of the left lung.   Electronically Signed   By: Elgie Collard M.D.   On: 12/08/2014 02:07    Scheduled Meds: . antiseptic oral rinse  7  mL Mouth Rinse QID  . carvedilol  25 mg Oral BID WC  . chlorhexidine  15 mL Mouth Rinse BID  . famotidine (PEPCID) IV  20 mg Intravenous Q12H  . furosemide  20 mg Intravenous Daily  . ipratropium-albuterol  3 mL Nebulization Q4H  . methylPREDNISolone (SOLU-MEDROL) injection  40 mg Intravenous Q12H  . piperacillin-tazobactam (ZOSYN)  IV  3.375 g Intravenous Q8H  . polyethylene glycol  17 g Oral BID  . senna  1 tablet Oral QHS  . sodium chloride  10-40 mL Intracatheter Q12H   Continuous Infusions: . sodium chloride 10 mL/hr at 01/05/15 0600  . diltiazem (CARDIZEM) infusion 10 mg/hr (12/13/14 0517)    Principal Problem:   Acute respiratory failure with hypoxia Active Problems:   Essential hypertension   DM type 2 (diabetes mellitus, type 2)   Aspiration pneumonia   Thyroid mass   Acute respiratory failure with hypercapnia   Mucus plugging of bronchi   Atrial fibrillation    Time spent:    Adeel Guiffre  [facility] Pager [phone]. If 7PM-7AM, please  contact night-coverage at [email or web address], password [facility] 12/13/2014, 9:36 AM  LOS: 9 days

## 2014-12-13 NOTE — Progress Notes (Signed)
Subjective: She says she still doesn't feel well. She is complaining of some chest discomfort.  Objective: Vital signs in last 24 hours: Temp:  [97.2 F (36.2 C)-98.1 F (36.7 C)] 98.1 F (36.7 C) (07/13 0400) Pulse Rate:  [25-219] 84 (07/13 0500) Resp:  [17-26] 20 (07/13 0500) BP: (107-137)/(45-94) 134/65 mmHg (07/13 0500) SpO2:  [90 %-100 %] 96 % (07/13 0722) Weight:  [108 kg (238 lb 1.6 oz)] 108 kg (238 lb 1.6 oz) (07/13 0500) Weight change: 0.4 kg (14.1 oz) Last BM Date: 12/11/14  Intake/Output from previous day: 07/12 0701 - 07/13 0700 In: 3378.2 [I.V.:3128.2; IV Piggyback:250] Out: 2275 [Urine:2275]  PHYSICAL EXAM General appearance: alert and moderate distress Resp: She has rhonchi bilaterally and diminished breath sounds more on the left than on the right Cardio: regular rate and rhythm, S1, S2 normal, no murmur, click, rub or gallop GI: soft, non-tender; bowel sounds normal; no masses,  no organomegaly Extremities: extremities normal, atraumatic, no cyanosis or edema  Lab Results:  Results for orders placed or performed during the hospital encounter of 12/15/2014 (from the past 48 hour(s))  Glucose, capillary     Status: Abnormal   Collection Time: 12/11/14 11:26 AM  Result Value Ref Range   Glucose-Capillary 190 (H) 65 - 99 mg/dL   Comment 1 Notify RN    Comment 2 Document in Chart   Glucose, capillary     Status: Abnormal   Collection Time: 12/11/14  4:50 PM  Result Value Ref Range   Glucose-Capillary 154 (H) 65 - 99 mg/dL   Comment 1 Notify RN    Comment 2 Document in Chart   Glucose, capillary     Status: Abnormal   Collection Time: 12/11/14  7:33 PM  Result Value Ref Range   Glucose-Capillary 164 (H) 65 - 99 mg/dL  Glucose, capillary     Status: Abnormal   Collection Time: 12/11/14 11:59 PM  Result Value Ref Range   Glucose-Capillary 153 (H) 65 - 99 mg/dL  Blood gas, arterial     Status: Abnormal   Collection Time: 12/08/2014  2:00 AM  Result Value Ref  Range   O2 Content 4.0 L/min   Delivery systems NASAL CANNULA    pH, Arterial 7.346 (L) 7.350 - 7.450   pCO2 arterial 45.5 (H) 35.0 - 45.0 mmHg   pO2, Arterial 62.8 (L) 80.0 - 100.0 mmHg   Bicarbonate 24.2 (H) 20.0 - 24.0 mEq/L   TCO2 22.0 0 - 100 mmol/L   Acid-base deficit 0.7 0.0 - 2.0 mmol/L   O2 Saturation 90.1 %   Patient temperature 37.0    Collection site RIGHT RADIAL    Drawn by [id number]    Sample type ARTERIAL DRAW    Allens test (pass/fail) PASS PASS  Glucose, capillary     Status: Abnormal   Collection Time: 12/26/2014  4:40 AM  Result Value Ref Range   Glucose-Capillary 212 (H) 65 - 99 mg/dL  CBC with Differential/Platelet     Status: Abnormal   Collection Time: 12/08/2014  5:45 AM  Result Value Ref Range   WBC 19.0 (H) 4.0 - 10.5 K/uL   RBC 4.51 3.87 - 5.11 MIL/uL   Hemoglobin 12.1 12.0 - 15.0 g/dL   HCT 39.4 36.0 - 46.0 %   MCV 87.4 78.0 - 100.0 fL   MCH 26.8 26.0 - 34.0 pg   MCHC 30.7 30.0 - 36.0 g/dL   RDW 15.9 (H) 11.5 - 15.5 %   Platelets 278 150 - 400  K/uL   Neutrophils Relative % 92 (H) 43 - 77 %   Neutro Abs 17.4 (H) 1.7 - 7.7 K/uL   Lymphocytes Relative 4 (L) 12 - 46 %   Lymphs Abs 0.8 0.7 - 4.0 K/uL   Monocytes Relative 4 3 - 12 %   Monocytes Absolute 0.8 0.1 - 1.0 K/uL   Eosinophils Relative 0 0 - 5 %   Eosinophils Absolute 0.0 0.0 - 0.7 K/uL   Basophils Relative 0 0 - 1 %   Basophils Absolute 0.0 0.0 - 0.1 K/uL  Comprehensive metabolic panel     Status: Abnormal   Collection Time: 12/18/2014  5:45 AM  Result Value Ref Range   Sodium 143 135 - 145 mmol/L   Potassium 4.0 3.5 - 5.1 mmol/L   Chloride 109 101 - 111 mmol/L   CO2 25 22 - 32 mmol/L   Glucose, Bld 289 (H) 65 - 99 mg/dL   BUN 47 (H) 6 - 20 mg/dL   Creatinine, Ser 0.96 0.44 - 1.00 mg/dL   Calcium 8.4 (L) 8.9 - 10.3 mg/dL   Total Protein 6.2 (L) 6.5 - 8.1 g/dL   Albumin 2.8 (L) 3.5 - 5.0 g/dL   AST 12 (L) 15 - 41 U/L   ALT 24 14 - 54 U/L   Alkaline Phosphatase 47 38 - 126 U/L   Total  Bilirubin 0.7 0.3 - 1.2 mg/dL   GFR calc non Af Amer 59 (L) >60 mL/min   GFR calc Af Amer >60 >60 mL/min    Comment: (NOTE) The eGFR has been calculated using the CKD EPI equation. This calculation has not been validated in all clinical situations. eGFR's persistently <60 mL/min signify possible Chronic Kidney Disease.    Anion gap 9 5 - 15  Magnesium     Status: None   Collection Time: 12/04/2014  5:45 AM  Result Value Ref Range   Magnesium 1.8 1.7 - 2.4 mg/dL  Glucose, capillary     Status: Abnormal   Collection Time: 12/08/2014  7:36 AM  Result Value Ref Range   Glucose-Capillary 194 (H) 65 - 99 mg/dL   Comment 1 Notify RN    Comment 2 Document in Chart   Glucose, capillary     Status: Abnormal   Collection Time: 12/23/2014 11:19 AM  Result Value Ref Range   Glucose-Capillary 170 (H) 65 - 99 mg/dL   Comment 1 Notify RN    Comment 2 Document in Chart   Glucose, capillary     Status: Abnormal   Collection Time: 12/13/14 12:33 AM  Result Value Ref Range   Glucose-Capillary 205 (H) 65 - 99 mg/dL    ABGS  Recent Labs  12/22/2014 0200  PHART 7.346*  PO2ART 62.8*  TCO2 22.0  HCO3 24.2*   CULTURES Recent Results (from the past 240 hour(s))  Blood culture (routine x 2)     Status: None   Collection Time: 12/04/14  4:00 AM  Result Value Ref Range Status   Specimen Description RIGHT ANTECUBITAL  Final   Special Requests BOTTLES DRAWN AEROBIC ONLY [place]  Final   Culture NO GROWTH 5 DAYS  Final   Report Status 12/09/2014 FINAL  Final  Blood culture (routine x 2)     Status: None   Collection Time: 12/04/14  4:16 AM  Result Value Ref Range Status   Specimen Description RIGHT ANTECUBITAL  Final   Special Requests BOTTLES DRAWN AEROBIC ONLY [place]  Final   Culture NO GROWTH 5  DAYS  Final   Report Status 12/09/2014 FINAL  Final  MRSA PCR Screening     Status: None   Collection Time: 12/04/14  4:24 AM  Result Value Ref Range Status   MRSA by PCR NEGATIVE NEGATIVE Final    Comment:         The GeneXpert MRSA Assay (FDA approved for NASAL specimens only), is one component of a comprehensive MRSA colonization surveillance program. It is not intended to diagnose MRSA infection nor to guide or monitor treatment for MRSA infections.    Studies/Results: Dg Chest Port 1 View  12/20/2014   CLINICAL DATA:  Respiratory failure  EXAM: PORTABLE CHEST - 1 VIEW  COMPARISON:  12/20/2014  FINDINGS: The left arm PICC line tip is in the cavoatrial junction. There is asymmetric elevation of the left hemidiaphragm with left lung volume loss and airspace consolidation. The appearance is similar to previous exam. Small right pleural effusion is suspected.  IMPRESSION: 1. Similar appearance of asymmetric airspace consolidation involving the left lung.   Electronically Signed   By: Kerby Moors M.D.   On: 12/15/2014 10:18   Dg Chest Port 1 View  12/02/2014   CLINICAL DATA:  69 year old female with shortness of breath  EXAM: PORTABLE CHEST - 1 VIEW  COMPARISON:  Chest radiograph dated 12/11/2014  FINDINGS: There is near complete consolidation of the left lung, worsened compared to the prior study. Small aerated area is noted in the left apical region. There is elevation of the left hemidiaphragm. The right lung is clear. The cardiac borders are silhouetted. The osseous structures are grossly unremarkable.  IMPRESSION: Near complete consolidation of the left lung.   Electronically Signed   By: Anner Crete M.D.   On: 12/28/2014 02:07    Medications:  Prior to Admission:  Prescriptions prior to admission  Medication Sig Dispense Refill Last Dose  . Alum & Mag Hydroxide-Simeth (MAGIC MOUTHWASH) SOLN Take 10 mLs by mouth 3 (three) times daily.  0 12/04/2014 at Unknown time  . aspirin 81 MG tablet Take 162 mg by mouth daily.    12/04/2014 at Unknown time  . BREO ELLIPTA 100-25 MCG/INH AEPB Inhale 1 puff into the lungs daily.   12/04/2014 at Unknown time  . carvedilol (COREG) 25 MG tablet Take 1  tablet (25 mg total) by mouth 2 (two) times daily with a meal. 60 tablet 11 12/04/2014 at 0900  . Coenzyme Q10 (CO Q 10) 100 MG CAPS Take 100 mg by mouth 2 (two) times daily.    12/04/2014 at Unknown time  . ferrous sulfate 325 (65 FE) MG tablet Take 325 mg by mouth daily.   12/04/2014 at Unknown time  . fluconazole (DIFLUCAN) 100 MG tablet Take 1 tablet (100 mg total) by mouth daily. For 7 days 7 tablet 0 12/04/2014 at Unknown time  . folic acid (FOLVITE) 1 MG tablet Take 1 tablet (1 mg total) by mouth daily. 90 tablet 3 12/04/2014 at Unknown time  . furosemide (LASIX) 40 MG tablet Take 40 mg by mouth daily.    12/04/2014 at Unknown time  . gabapentin (NEURONTIN) 100 MG capsule Take 100 mg by mouth 3 (three) times daily.   12/04/2014 at Unknown time  . levalbuterol (XOPENEX) 0.63 MG/3ML nebulizer solution Take 3 mLs by nebulization every 6 (six) hours as needed for wheezing or shortness of breath.    unknown  . levothyroxine (SYNTHROID, LEVOTHROID) 112 MCG tablet Take 112 mcg by mouth daily before breakfast.   12/04/2014  at Unknown time  . lidocaine (XYLOCAINE) 2 % solution Use as directed 10 mLs in the mouth or throat 3 (three) times daily. 100 mL 0 12/04/2014 at Unknown time  . niacin 500 MG tablet Take 500 mg by mouth at bedtime.    12/20/2014 at Unknown time  . ondansetron (ZOFRAN ODT) 8 MG disintegrating tablet Take 1 tablet (8 mg total) by mouth every 8 (eight) hours as needed. 40m ODT q4 hours prn nausea 12 tablet 0 unknown  . potassium chloride SA (K-DUR,KLOR-CON) 20 MEQ tablet Take 20 mEq by mouth 2 (two) times daily.   12/04/2014 at Unknown time  . pravastatin (PRAVACHOL) 80 MG tablet Take 1 tablet (80 mg total) by mouth daily. 90 tablet 3 12/18/2014 at Unknown time  . predniSONE (DELTASONE) 20 MG tablet Take 4110mpo daily for 3 more days (Patient taking differently: Take 40 mg by mouth 3 (three) times daily. )   12/04/2014 at Unknown time  . ranitidine (ZANTAC) 150 MG tablet Take 150 mg by mouth at bedtime.    12/18/2014 at Unknown time  . SPIRIVA HANDIHALER 18 MCG inhalation capsule Take 18 mcg by mouth daily.   12/04/2014 at Unknown time  . ZETIA 10 MG tablet TAKE ONE TABLET BY MOUTH ONCE DAILY. 30 tablet 11 12/04/2014 at Unknown time   Scheduled: . antiseptic oral rinse  7 mL Mouth Rinse QID  . carvedilol  25 mg Oral BID WC  . chlorhexidine  15 mL Mouth Rinse BID  . famotidine (PEPCID) IV  20 mg Intravenous Q12H  . furosemide  20 mg Intravenous Daily  . ipratropium-albuterol  3 mL Nebulization Q4H  . methylPREDNISolone (SOLU-MEDROL) injection  40 mg Intravenous Q12H  . piperacillin-tazobactam (ZOSYN)  IV  3.375 g Intravenous Q8H  . polyethylene glycol  17 g Oral BID  . senna  1 tablet Oral QHS  . sodium chloride  10-40 mL Intracatheter Q12H   Continuous: . sodium chloride 10 mL/hr at 12/11/2014 0600  . diltiazem (CARDIZEM) infusion 10 mg/hr (12/13/14 0517)   PRGKK:DPTELMRAJLORazepam, metoprolol, morphine injection, ondansetron **OR** ondansetron (ZOFRAN) IV, sodium chloride  Assesment: She has acute hypoxic and hypercapnic respiratory failure. She has essentially a whiteout of her left lung is felt that she has mucous plugs. She has aspirated. She does not want to be reintubated. She does not want bronchoscopy. She is interested in being comfortable. Yesterday it was decided that we would continue current treatments to see if she would improve but leaning toward comfort. Principal Problem:   Acute respiratory failure with hypoxia Active Problems:   Essential hypertension   DM type 2 (diabetes mellitus, type 2)   Aspiration pneumonia   Thyroid mass   Acute respiratory failure with hypercapnia   Mucus plugging of bronchi   Atrial fibrillation    Plan: I took the liberty of increasing her morphine and adding Ativan to see if it will make her more comfortable    LOS: 9 days   Lonney Revak L 12/13/2014, 8:10 AM

## 2014-12-13 NOTE — Consult Note (Signed)
Consultation Note Date: 12/13/2014   Patient Name: Samantha [name]  DOB: [birth year]  MRN: [id number]  Age / Sex: 69 y.o., female   PCP: Assunta Found, MD Referring Physician: Erick Blinks, MD  Reason for Consultation: Establishing goals of care, Psychosocial/spiritual support and Terminal care  Palliative Care Assessment and Plan Summary of Established Goals of Care and Medical Treatment Preferences   Clinical Assessment/Narrative: Samantha [name] looks uncomfortable, picking at her gown and reaching out, sitting up in her bed in a dark room.  She is unable to speak, but is able to nod or shake her head.  Her husband is at her bedside.  I encouraged Samantha [name] that we have done all we can for her, and she nods her head yes, and that we also know that she has done all she can do too, and she also nods her head.  She nods when asked about sips of water, but is unable to manage her airway and chokes on her second sip.   We meet again when her daughter, Pablo Ledger Cox arrives.  Mrs. [name] looks more comfortable after IV ativan.  Mr. Arion, Morgan and I move to another room to talk about their goals for Samantha [name].  They both state they feel like her end is near and their goal is that she be made as comfortable as possible.  We discuss stopping her IV antibiotics, IV Cardizem, and starting Morphine drip and increasing her Ativan IV. We also talk about Ms. Hennen possibly being moved to 3rd floor.  Mr. Plant and Pablo Ledger state they would like for her to stay where she is, but they understand.  They talk about her lengthy illness, and the struggles to find out what had caused her problems.  Both Mr. Raben and Marvette state they hope her end is sooner rather than later.  They are calling in family to visit.   Will dd Morphine continuous infusion and schedule Ativan.   Contacts/Participants in Discussion: Primary Decision Maker: Mr. Michl, and Marvette Cox, Mrs. Gaughran adult daughter are the primary decision makers.  HCPOA:      Code Status/Advance Care Planning:  DNR  Family state they wish for her to not linger and suffer, stating [name] is ready to pass.   Symptom Management:   Morphine 2 mg IV Q 2 hours, Ativan 1 mg IV Q 4 hours  Palliative Prophylaxis: Miralax QD  Psycho-social/Spiritual:   Support System: [name] lives with her husband of 35 years, who is her main caregiver. He has cared for her using a lift for the last year.    Desire for further Chaplaincy support:  Not discussed today.  Prognosis: Hours - Days  Discharge Planning:  Likely hospital death.        Chief Complaint:  SOB History of Present Illness:   Samantha [name] is a 69 year old female whohas a past medical history of Brain aneurysm; Neuropathy; Diabetes mellitus; Anemia; Thyroid disease; Arthritis; Macular degeneration; Chest pain; Hypertension; Hyperlipidemia; Obesity; Abnormal stress test; cardiac catheterization (11/2013); Sleep apnea; Difficult intubation (02/08/14); COPD (chronic obstructive pulmonary disease); CHF (congestive heart failure); Brain aneurysm (2004); Diabetes mellitus without complication; Renal disorder; and Malnutrition. Patient was discharged from the hospital on 7-16 and was brought back to the hospital for worsening shortness of breath on 7/4.  Her oxygen saturation was in the low in 80s. Patient required nonrebreather and was sent to ED for further evaluation. Patient denies chest pain no nausea vomiting or diarrhea. Denies fever she  has a history of COPD.  In the ED chest x-ray was done which showed mild pneumonia, CT chest was done, which showed findings concerning for aspiration pneumonia and also showed 1.8 cm unusual mass in the right thyroid lobe.  During her hospital stay she has not had improvement and was intubated and ventilated for several days. She stated that she did not want to have intubation again. She has, over the last few days, declined to have a bronch for removal of mucus plugging and seems  to have made peace with her state. She was made DNR 7/12.   Primary Diagnoses  Present on Admission:  . Aspiration pneumonia . Essential hypertension  Palliative Review of Systems: Ms. Arlana Pouchate is unable to participate in ROS.    I have reviewed the medical record, interviewed the patient and family, and examined the patient. The following aspects are pertinent.  Past Medical History  Diagnosis Date  . Brain aneurysm   . Neuropathy   . Diabetes mellitus   . Anemia   . Thyroid disease   . Arthritis   . Macular degeneration   . Chest pain   . Hypertension   . Hyperlipidemia   . Obesity   . Abnormal stress test     -false positive, prob breast attenuation  . Hx of cardiac catheterization 11/2013    normal coronary arteries  . Sleep apnea     doesn't wear CPAP, sleeps in a lift chair  . Difficult intubation 02/08/14    re-intubated in PACU  . COPD (chronic obstructive pulmonary disease)   . CHF (congestive heart failure)   . Brain aneurysm 2004  . Diabetes mellitus without complication   . Renal disorder   . Malnutrition    History   Social History  . Marital Status: Married    Spouse Name: N/A  . Number of Children: 1  . Years of Education: 12   Occupational History  . Retired    Social History Main Topics  . Smoking status: Former Smoker    Quit date: 06/02/2002  . Smokeless tobacco: Not on file  . Alcohol Use: No  . Drug Use: No  . Sexual Activity: No   Other Topics Concern  . None   Social History Narrative   ** Merged History Encounter **       Family History  Problem Relation Age of Onset  . Diabetes      family history   . Arthritis      family history   . Diabetes Mother   . Hypertension Mother   . Seizures Sister   . Colon cancer Neg Hx    Scheduled Meds: . antiseptic oral rinse  7 mL Mouth Rinse QID  . carvedilol  25 mg Oral BID WC  . chlorhexidine  15 mL Mouth Rinse BID  . famotidine (PEPCID) IV  20 mg Intravenous Q12H  . furosemide   20 mg Intravenous Daily  . insulin aspart  0-5 Units Subcutaneous QHS  . insulin aspart  0-9 Units Subcutaneous TID WC  . ipratropium-albuterol  3 mL Nebulization Q4H  . methylPREDNISolone (SOLU-MEDROL) injection  40 mg Intravenous Q12H  . piperacillin-tazobactam (ZOSYN)  IV  3.375 g Intravenous Q8H  . polyethylene glycol  17 g Oral BID  . senna  1 tablet Oral QHS  . sodium chloride  10-40 mL Intracatheter Q12H   Continuous Infusions: . sodium chloride 10 mL/hr at 12/13/2014 0600  . diltiazem (CARDIZEM) infusion 15 mg/hr (12/13/14 1300)  PRN Meds:.albuterol, LORazepam, metoprolol, morphine injection, ondansetron **OR** ondansetron (ZOFRAN) IV, sodium chloride Medications Prior to Admission:  Prior to Admission medications   Medication Sig Start Date End Date Taking? Authorizing Provider  Alum & Mag Hydroxide-Simeth (MAGIC MOUTHWASH) SOLN Take 10 mLs by mouth 3 (three) times daily. 12/02/14  Yes Erick Blinks, MD  aspirin 81 MG tablet Take 162 mg by mouth daily.    Yes Historical Provider, MD  BREO ELLIPTA 100-25 MCG/INH AEPB Inhale 1 puff into the lungs daily. 10/02/14  Yes Historical Provider, MD  carvedilol (COREG) 25 MG tablet Take 1 tablet (25 mg total) by mouth 2 (two) times daily with a meal. 02/15/14  Yes Runell Gess, MD  Coenzyme Q10 (CO Q 10) 100 MG CAPS Take 100 mg by mouth 2 (two) times daily.    Yes Historical Provider, MD  ferrous sulfate 325 (65 FE) MG tablet Take 325 mg by mouth daily.   Yes Historical Provider, MD  fluconazole (DIFLUCAN) 100 MG tablet Take 1 tablet (100 mg total) by mouth daily. For 7 days 12/02/14  Yes Erick Blinks, MD  folic acid (FOLVITE) 1 MG tablet Take 1 tablet (1 mg total) by mouth daily. 12/20/13  Yes Runell Gess, MD  furosemide (LASIX) 40 MG tablet Take 40 mg by mouth daily.  07/04/13  Yes Runell Gess, MD  gabapentin (NEURONTIN) 100 MG capsule Take 100 mg by mouth 3 (three) times daily.   Yes Historical Provider, MD  levalbuterol (XOPENEX)  0.63 MG/3ML nebulizer solution Take 3 mLs by nebulization every 6 (six) hours as needed for wheezing or shortness of breath.  10/18/14  Yes Historical Provider, MD  levothyroxine (SYNTHROID, LEVOTHROID) 112 MCG tablet Take 112 mcg by mouth daily before breakfast.   Yes Historical Provider, MD  lidocaine (XYLOCAINE) 2 % solution Use as directed 10 mLs in the mouth or throat 3 (three) times daily. 12/02/14  Yes Erick Blinks, MD  niacin 500 MG tablet Take 500 mg by mouth at bedtime.    Yes Historical Provider, MD  ondansetron (ZOFRAN ODT) 8 MG disintegrating tablet Take 1 tablet (8 mg total) by mouth every 8 (eight) hours as needed.  ODT q4 hours prn nausea 11/17/14  Yes Zadie Rhine, MD  potassium chloride SA (K-DUR,KLOR-CON) 20 MEQ tablet Take 20 mEq by mouth 2 (two) times daily. 08/15/14  Yes Historical Provider, MD  pravastatin (PRAVACHOL) 80 MG tablet Take 1 tablet (80 mg total) by mouth daily. 11/25/12  Yes Susa Griffins, MD  predniSONE (DELTASONE) 20 MG tablet Take  po daily for 3 more days Patient taking differently: Take 40 mg by mouth 3 (three) times daily.  12/02/14  Yes Erick Blinks, MD  ranitidine (ZANTAC) 150 MG tablet Take 150 mg by mouth at bedtime. 09/11/14  Yes Historical Provider, MD  SPIRIVA HANDIHALER 18 MCG inhalation capsule Take 18 mcg by mouth daily. 10/05/14  Yes Historical Provider, MD  ZETIA 10 MG tablet TAKE ONE TABLET BY MOUTH ONCE DAILY. 09/14/14  Yes Runell Gess, MD  HYDROcodone-acetaminophen (HYCET) 7.5-325 mg/15 ml solution Give 1 tablespoonful by mouth every 6 hours as needed for pain DO NOT EXCEED 3 GMS APAP PER 24 HOURS FROM ALL SOURCES 12/05/14   Sharon Seller, NP   Allergies  Allergen Reactions  . Clarithromycin Anaphylaxis  . Levofloxacin Anaphylaxis  . Metronidazole Anaphylaxis  . Sulfonamide Derivatives Anaphylaxis  . Amoxicillin-Pot Clavulanate     Heart palpitations  . Augmentin [Amoxicillin-Pot Clavulanate] Other (See Comments)  Heart  palpatation  . Clindamycin/Lincomycin Diarrhea  . Lansoprazole     SOB, heart racing when took Prevpac  . Metronidazole   . Sulfamethoxazole-Trimethoprim Swelling   CBC:    Component Value Date/Time   WBC 19.0* 12/15/2014 0545   HGB 12.1 12/31/2014 0545   HCT 39.4 12/02/2014 0545   PLT 278 12/18/2014 0545   MCV 87.4 12/24/2014 0545   NEUTROABS 17.4* 12/08/2014 0545   LYMPHSABS 0.8 12/03/2014 0545   MONOABS 0.8 12/04/2014 0545   EOSABS 0.0 12/30/2014 0545   BASOSABS 0.0 12/04/2014 0545   Comprehensive Metabolic Panel:    Component Value Date/Time   NA 143 12/02/2014 0545   K 4.0 12/30/2014 0545   CL 109 12/10/2014 0545   CO2 25 12/16/2014 0545   BUN 47* 12/11/2014 0545   CREATININE 0.96 12/06/2014 0545   CREATININE 0.82 12/19/2013 1018   GLUCOSE 289* 12/28/2014 0545   CALCIUM 8.4* 12/11/2014 0545   AST 12* 12/15/2014 0545   ALT 24 12/31/2014 0545   ALKPHOS 47 12/30/2014 0545   BILITOT 0.7 12/21/2014 0545   PROT 6.2* 12/04/2014 0545   ALBUMIN 2.8* 12/31/2014 0545    Physical Exam: Vital Signs: BP 113/66 mmHg  Pulse 81  Temp(Src) 96.9 F (36.1 C) (Axillary)  Resp 17  Ht 5\' 11"  (1.803 m)  Wt 108 kg (238 lb 1.6 oz)  BMI 33.22 kg/m2  SpO2 94% SpO2: SpO2: 94 % O2 Device: O2 Device: Nasal Cannula O2 Flow Rate: O2 Flow Rate (L/min): 3 L/min Intake/output summary:  Intake/Output Summary (Last 24 hours) at 12/13/14 1433 Last data filed at 12/13/14 1300  Gross per 24 hour  Intake 2733.16 ml  Output   1850 ml  Net 883.16 ml   LBM: Last BM Date: 12/11/14 Baseline Weight: Weight: 109.77 kg (242 lb) Most recent weight: Weight: 108 kg (238 lb 1.6 oz)  Exam Findings:  Constitutional: Resp:  Open mouthed breathing, some work of breathing noted, improved with Ativan.  GI: abd soft, rounded Cardio: HR 90-110's.            Palliative Performance Scale: 10-20% at best.               Additional Data Reviewed: Recent Labs     12/02/2014  0545  WBC  19.0*  HGB   12.1  PLT  278  NA  143  BUN  47*  CREATININE  0.96     Time In: 1325 Time Out:   1445 Time Total:   80 minutes   Greater than 50%  of this time was spent counseling and coordinating care related to the above assessment and plan.  Discussion and plan of care shared with nursing and Dr. Kerry Hough.  Thank you for consulting the Palliative Medicine Team.   Signed by: Katheran Awe, NP  Katheran Awe, NP  12/13/2014, 2:33 PM  Please contact Palliative Medicine Team phone at [phone] for questions and concerns.

## 2014-12-13 NOTE — Progress Notes (Signed)
NP Maren ReamerKaren Kirby updated on pt's cbg=205 @0030  with no coverage rx'd but to call if > 160  & still rx'd q4  [facility] now RX [facility]

## 2014-12-14 DIAGNOSIS — I10 Essential (primary) hypertension: Secondary | ICD-10-CM

## 2014-12-14 DIAGNOSIS — Z7189 Other specified counseling: Secondary | ICD-10-CM

## 2014-12-14 MED FILL — Medication: Qty: 1 | Status: AC

## 2015-01-01 NOTE — Progress Notes (Signed)
She has been transitioned to full comfort care which I think is appropriate and I will plan to sign off at this point.    Thanks for allowing me to see her with you

## 2015-01-01 NOTE — Discharge Summary (Signed)
Death Summary  Samantha RubensteinBarbara A Rossmann ONG:[id number] DOB: 05/08/1946 DOA: 12/13/2014  PCP: Colette RibasGOLDING, [name], MD  Admit date: 12/05/2014 Date of Death: 12/04/2014  Final Diagnoses:  Principal Problem:   Acute on chronic respiratory failure with hypoxia Active Problems:   Essential hypertension   DM type 2 (diabetes mellitus, type 2)   Aspiration pneumonia   Thyroid mass   Acute respiratory failure with hypercapnia    Mucus plugging of bronchi   Atrial fibrillation with rapid ventricular response   Palliative care encounter   DNR (do not resuscitate) discussion  COPD  Chronic dysphasia   Hypothyroidism   History of present illness:  Patient was recently sent to a nursing home for physical therapy. She came back to the hospital with shortness of breath and hypoxia with oxygen saturations in the 80s. She required nonrebreather mask. Chest x-ray showed mild pneumonia. She was admitted for further treatment.  Hospital Course:  It was felt that patient had acute on chronic respiratory failure with hypoxia related to aspiration pneumonia. She had known chronic dysphagia and was seen by speech therapy who had recommended a modified diet. Despite being on a modified diet, she continued to aspirate and developed significant pneumonia. She was started on intravenous antibiotics, but despite aggressive measures she declined and required intubation and mechanical ventilation for respiratory failure. The patient was on the ventilator for several days, but was successfully able to extubated. Unfortunately, shortly after extubation she developed recurrent aspiration and shortness of breath. Chest x-ray showed left-sided white out related to mucus plugging. She was followed by pulmonology who offered her bronchoscopy for possible management of mucous plug. The patient refused this intervention. After further discussion with the patient, she reported that she wished to pursue a comfort care approach. She was seen by  palliative care services who started the patient on a morphine infusion for comfort. The patient was monitored in the hospital and quickly declined. On 7/14 at 11:43 AM, she was found to be without respirations or pulse and was pronounced dead. Family was at the bedside and was offered support.   Time: 15mins  Signed:  Shelva Hetzer  [facility] 12/09/2014, 7:36 PM

## 2015-01-01 NOTE — Progress Notes (Signed)
Patient's heart rate dropped into the 40's with long periods of apnea and agonal breathing.  Family at bedside. Will continue to support family.

## 2015-01-01 NOTE — Progress Notes (Cosign Needed)
Daily Progress Note   Patient Name: Samantha [name]       Date: 2014-12-27 DOB: April 13, 1946  Age: 69 y.o. MRN#: [id number] Attending Physician: Erick Blinks, MD Primary Care Physician: Colette Ribas, MD Admit Date: 12/04/2014  Reason for Consultation/Follow-up: Psychosocial/spiritual support and Terminal care  Subjective: Samantha [name] is resting with her family around her. Her heart rate has decreased to the mid 60's.  She does not respond to my voice or touch.  Support given to family and nursing staff.   Interval Events: Morphine  /hour infusion.   Length of Stay: 10 days  Current Medications: Scheduled Meds:  . famotidine (PEPCID) IV  20 mg Intravenous Q12H  . furosemide  20 mg Intravenous Daily  . LORazepam  2 mg Intravenous Q6H  . sodium chloride  10-40 mL Intracatheter Q12H    Continuous Infusions: . sodium chloride 10 mL/hr at 12-27-14 0700  . morphine 3 mg/hr (2014-12-27 0700)    PRN Meds: albuterol, LORazepam, morphine injection, ondansetron **OR** ondansetron (ZOFRAN) IV, sodium chloride  Palliative Performance Scale:  10%     Vital Signs: BP 104/47 mmHg  Pulse 70  Temp(Src) 97.2 F (36.2 C) (Axillary)  Resp 14  Ht  (1.803 m)  Wt 108 kg (238 lb 1.6 oz)  BMI 33.22 kg/m2  SpO2 95% SpO2: SpO2: 95 % O2 Device: O2 Device: Nasal Cannula O2 Flow Rate: O2 Flow Rate (L/min): 3 L/min  Intake/output summary:  Intake/Output Summary (Last 24 hours) at December 27, 2014 1102 Last data filed at 12/27/2014 0700  Gross per 24 hour  Intake 1487.27 ml  Output    150 ml  Net 1337.27 ml   LBM:   Baseline Weight: Weight: 109.77 kg (242 lb) Most recent weight: Weight: 108 kg (238 lb 1.6 oz)  Physical Exam: Constitutional :  Unable to open eyes Resp:  Shallow    Additional Data Reviewed: Recent Labs     12/13/2014  0545  WBC  19.0*  HGB  12.1  PLT  278  NA  143  BUN  47*  CREATININE  0.96     Problem List:  Patient Active Problem List   Diagnosis Date  Noted  . Palliative care encounter 12/13/2014  . DNR (do not resuscitate) discussion   . Acute respiratory failure with hypercapnia 12/08/2014  . Mucus plugging of bronchi 12/11/2014  . Atrial fibrillation 12/31/2014  . Acute respiratory failure with hypoxia 12/05/2014  . Thyroid mass 12/05/2014  . Aspiration pneumonia 12/04/2014  . Malnutrition   . UTI (urinary tract infection) 11/30/2014  . Protein-calorie malnutrition, severe 11/30/2014  . CHF (congestive heart failure)   . Renal disorder   . Cellulitis of right leg   . Leg weakness   . COPD exacerbation 11/29/2014  . Cellulitis 11/29/2014  . Right leg weakness 11/29/2014  . ARF (acute renal failure) 03/16/2014  . Chronic diarrhea 03/16/2014  . Nausea 03/16/2014  . DM type 2 (diabetes mellitus, type 2) 03/16/2014  . Cellulitis of left leg 03/16/2014  . Nephrolithiasis 03/16/2014  . Pain of right great toe 03/16/2014  . Chronic cholecystitis 02/08/2014  . Abdominal pain 01/04/2014  . Chest pain 12/13/2013  . Peripheral arterial disease 08/10/2013  . Essential hypertension 08/10/2013  . Hyperlipidemia 08/10/2013  . Morbid obesity 08/10/2013  . Atherosclerosis of native arteries of the extremities with intermittent claudication 12/02/2011  . OA (osteoarthritis) of knee 04/30/2011  . KNEE, ARTHRITIS, DEGEN./OSTEO 11/28/2009  . ARTHRITIS, KNEES, BILATERAL 11/28/2009  . PATELLO-FEMORAL  SYNDROME 11/28/2009     Palliative Care Assessment & Plan    Code Status:  DNR  Goals of Care:  Comfort   3. Symptom Management:  Morphine 3 g hour infusion, effective for dyspnea, anxiety, pain relief.  5. Prognosis: Hours - Days  5. Discharge Planning: hospital death   Care plan was discussed with nursing staff and Dr. Kerry HoughMemon.,   Thank you for allowing the Palliative Medicine Team to assist in the care of this patient.   Time In:  1025 Time Out:  1105 Total Time  40 min Prolonged Time Billed  no     Greater than 50%   of this time was spent counseling and coordinating care related to the above assessment and plan.   Katheran Aweasha A Rosibel Giacobbe, NP  10-15-14, 11:02 AM  Please contact Palliative Medicine Team phone at [phone] for questions and concerns.

## 2015-01-01 NOTE — Care Management Note (Signed)
Case Management Note  Patient Details  Name: Lesly RubensteinBarbara A Shepheard MRN: [id number] Date of Birth: [birth year]  Expected Discharge Date:  12/08/14               Expected Discharge Plan:  Skilled Nursing Facility  In-House Referral:  Clinical Social Work  Discharge planning Services  CM Consult  Post Acute Care Choice:  NA Choice offered to:  NA  DME Arranged:    DME Agency:     HH Arranged:    HH Agency:     Status of Service:  Completed, signed off  Medicare Important Message Given:  Yes-third notification given Date Medicare IM Given:    Medicare IM give by:    Date Additional Medicare IM Given:    Additional Medicare Important Message give by:     If discussed at Long Length of Stay Meetings, dates discussed:    Additional Comments: Pt expired. CSW is aware.  Malcolm Metrohildress, Gabrian Hoque Demske, RN 12/26/2014, 12:57 PM

## 2015-01-01 NOTE — Clinical Social Work Note (Signed)
CSW attempted to meet with family at bedside to provide support. Family's pastor was present. Pt actively dying. RN to notify CSW if needed.   Derenda FennelKara Aubriee Szeto, LCSW [phone]

## 2015-01-01 NOTE — Progress Notes (Signed)
Attending MD and Palliative Care RN notified of patient's death.

## 2015-01-01 NOTE — Progress Notes (Signed)
In room with patient when patient's heart rate dropped to 0 bpm.  No respirations, no BP.  Patient pronounced dead at 11:43am by Arloa KohLeigh Anne Schonewitz, RN and Armanda MagicNedine Rowe, RN.  Family at bedside.  Hospital chaplain at bedside. Will continue to support family.

## 2015-01-01 NOTE — Progress Notes (Signed)
[facility] PROGRESS NOTE  Samantha [name] KVQ:[id number] DOB: 01/01/1946 DOA: 12/27/2014 PCP: Purvis Kilts, MD  Assessment/Plan: 1. Acute on chronic hypoxic respiratory failure. Patient was intubated on 7/6 for suspected aspiration event. She was able to extubate on 7/11, but developed spontaneous left lower lobe collapse which was felt to be related to mucus plugging. She is currently on supplemental oxygen. Pulmonology following. Bronchoscopy was offered, but the patient has refused. Continue current treatments. 2. Aspiration pneumonia. Currently afebrile. She completed a course of antibiotics. CT scan was negative for pulmonary embolus. 3. Atrial fibrillation with rapid ventricular response. Felt to be related to pulmonary issues. TSH was in normal limits. 4. Recurrent mucus plugging. Patient refused bronchoscopy. Continue supportive measures 5. COPD . continue bronchodilators as needed for comfort. 6. Chronic dysphagia. Seen by speech therapy and recommendations are for dysphagia 2 diet with nectar thick liquids. She is not awake enough to take any food by mouth. 7. Hypothyroidism. Found to have a thyroid mass on ultrasound. TSH is within normal limits.  8. Diabetes. Was on sliding scale insulin, now discontinued 9. Discussion .Patient has had an overall complicated course. She has significant respiratory issues with recurrent mucus plugging, weak cough reflex and acute respiratory failure. She has recurrent aspiration despite being on a modified diet. She does not wish aggressive treatment such as bronchoscopy. She understands that her overall clinical condition may not improve and that she may decline. She is currently a DO NOT RESUSCITATE. Palliative care met with patient and family and it was decided to transition the patient to full comfort care. She is currently on morphine infusion for comfort. Prognosis is likely hours.  Code Status: DNR, comfort measures only Family  Communication: discussed with husband at the bedside Disposition Plan: likely will have in-hospital death   Consultants:  Pulmonology  Speech therapy  Palliative care  Procedures:  ETT 7/6>>7/11  PICC 7/10>>  Antibiotics:  zosyn 7/4>>7/13  Vancomycin 7/4>>7/13  HPI/Subjective: unresponsive  Objective: Filed Vitals:   December 28, 2014 0610  BP:   Pulse: 115  Temp:   Resp: 29    Intake/Output Summary (Last 24 hours) at 12/28/14 1012 Last data filed at 28-Dec-2014 0700  Gross per 24 hour  Intake 1487.27 ml  Output    150 ml  Net 1337.27 ml   Filed Weights   12/11/14 0530 12/13/2014 0500 12/13/14 0500  Weight: 108.7 kg (239 lb 10.2 oz) 107.6 kg (237 lb 3.4 oz) 108 kg (238 lb 1.6 oz)    Exam:   General:  Sedated, agonal breathing  Cardiovascular: s1, s2, rrr  Respiratory: diminished breath sounds bilaterally  Abdomen: soft, nt, nondistended  Musculoskeletal: 1+ edema b/l   Data Reviewed: Basic Metabolic Panel:  Recent Labs Lab 12/08/14 0505 12/10/14 0445 12/27/2014 0545  NA 145 144 143  K 4.4 4.6 4.0  CL 111 113* 109  CO2 _0 GLUCOSE 177* 215* 289*  BUN 39* 40* 47*  CREATININE 1.19* 1.14* 0.96  CALCIUM 8.1* 8.3* 8.4*  MG  --   --  1.8   Liver Function Tests:  Recent Labs Lab 12/04/2014 0545  AST 12*  ALT 24  ALKPHOS 47  BILITOT 0.7  PROT 6.2*  ALBUMIN 2.8*   No results for input(s): LIPASE, AMYLASE in the last 168 hours. No results for input(s): AMMONIA in the last 168 hours. CBC:  Recent Labs Lab 12/08/14 0520 12/09/14 1809 12/10/14 0007 12/10/14 0445 12/02/2014 0545  WBC 7.0 12.5* 21.8* 20.4* 19.0*  NEUTROABS 5.9  --   --   --  17.4*  HGB 11.2* 13.1 13.6 13.1 12.1  HCT 35.9* 41.6 42.9 41.5 39.4  MCV 85.9 86.8 86.1 86.1 87.4  PLT 208 169 227 180 278   Cardiac Enzymes: No results for input(s): CKTOTAL, CKMB, CKMBINDEX, TROPONINI in the last 168 hours. BNP (last 3 results)  Recent Labs  10/18/14 1829 12/10/2014 2340  BNP  53.0 53.0    ProBNP (last 3 results) No results for input(s): PROBNP in the last 8760 hours.  CBG:  Recent Labs Lab 12/17/2014 0736 12/26/2014 1119 12/13/14 0033 12/13/14 0754 12/13/14 1130  GLUCAP 194* 170* 205* 248* 305*    No results found for this or any previous visit (from the past 240 hour(s)).   Studies: No results found.  Scheduled Meds: . famotidine (PEPCID) IV  20 mg Intravenous Q12H  . furosemide  20 mg Intravenous Daily  . LORazepam  2 mg Intravenous Q6H  . sodium chloride  10-40 mL Intracatheter Q12H   Continuous Infusions: . sodium chloride 10 mL/hr at 12/31/2014 0700  . morphine 3 mg/hr (2014-12-31 0700)    Principal Problem:   Acute respiratory failure with hypoxia Active Problems:   Essential hypertension   DM type 2 (diabetes mellitus, type 2)   Aspiration pneumonia   Thyroid mass   Acute respiratory failure with hypercapnia   Mucus plugging of bronchi   Atrial fibrillation   Palliative care encounter   DNR (do not resuscitate) discussion    Time spent: 8mns    MEMON,JEHANZEB  [facility] Pager [phone] If 7PM-7AM, please contact night-coverage at [email or web address], password [phone] 10:12 AM  LOS: 10 days

## 2015-01-01 DEATH — deceased

## 2016-08-11 IMAGING — CR DG CHEST 1V PORT
1 series · 1 of 1 positions shown · non-contrast
Comparison: 02/08/2014

CLINICAL DATA: Postop hypoventilation

EXAM:
PORTABLE CHEST - 1 VIEW

[portable]
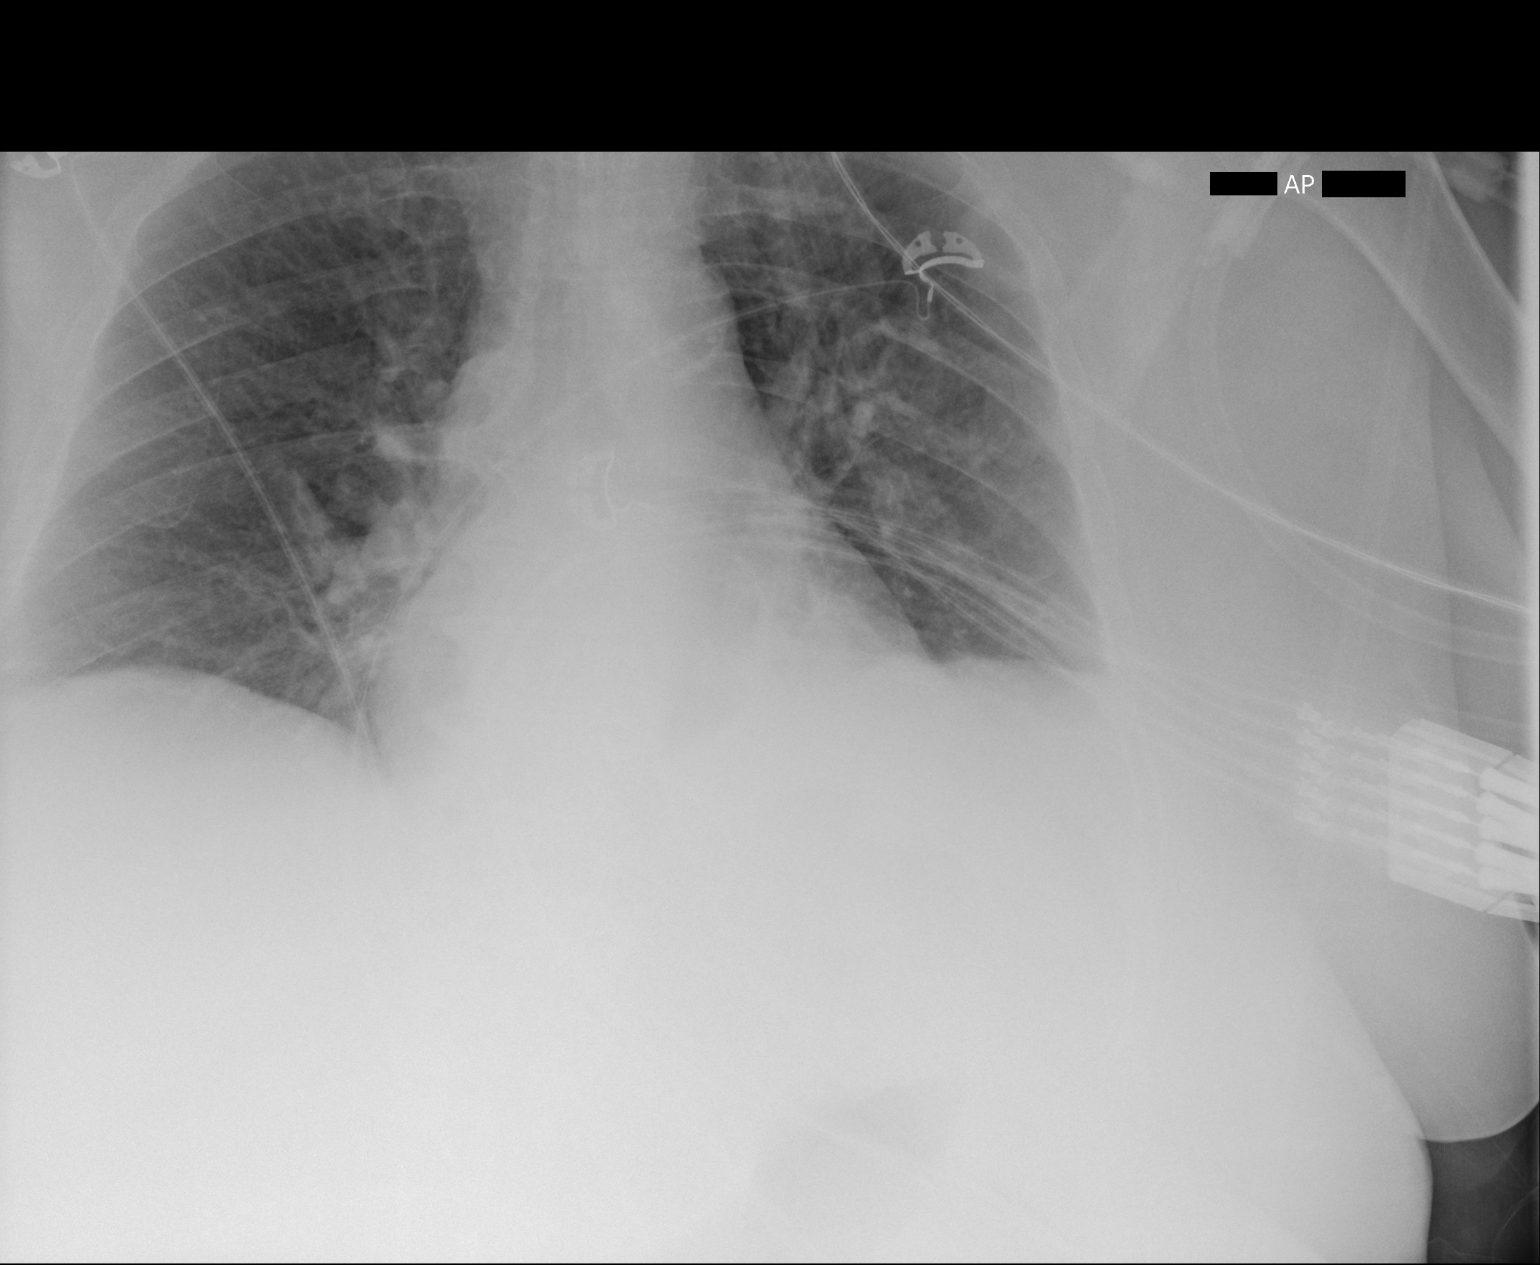

[1 of 1 positions shown; findings below may reference images not displayed]

FINDINGS: There is an endotracheal tube with the tip 6 cm above the carina.
There is elevation of the left diaphragm. There is left basilar
airspace disease which may reflect atelectasis versus pneumonia, but
unchanged compared with the prior exam. There is mild bilateral
interstitial thickening. There is no pleural effusion or
pneumothorax. The heart and mediastinal contours are unremarkable.

The osseous structures are unremarkable.
IMPRESSION: 1. Endotracheal tube with the tip 6 cm above the carina.
2. Bilateral interstitial thickening which in part may be related to
relative hypoventilation versus mild interstitial edema.
3. Left basilar airspace disease which may reflect atelectasis
versus pneumonia.

## 2017-04-19 IMAGING — CR DG CHEST 1V PORT
1 series · 1 of 1 positions shown · non-contrast
Comparison: Portable exam 6363 hours without priors for comparison

CLINICAL DATA: Shortness of breath beginning 3 days ago, lower
extremity swelling, oxygen desaturation, history COPD, CHF,
diabetes, former smoker, renal disorder

EXAM:
PORTABLE CHEST - 1 VIEW

[ap portable]
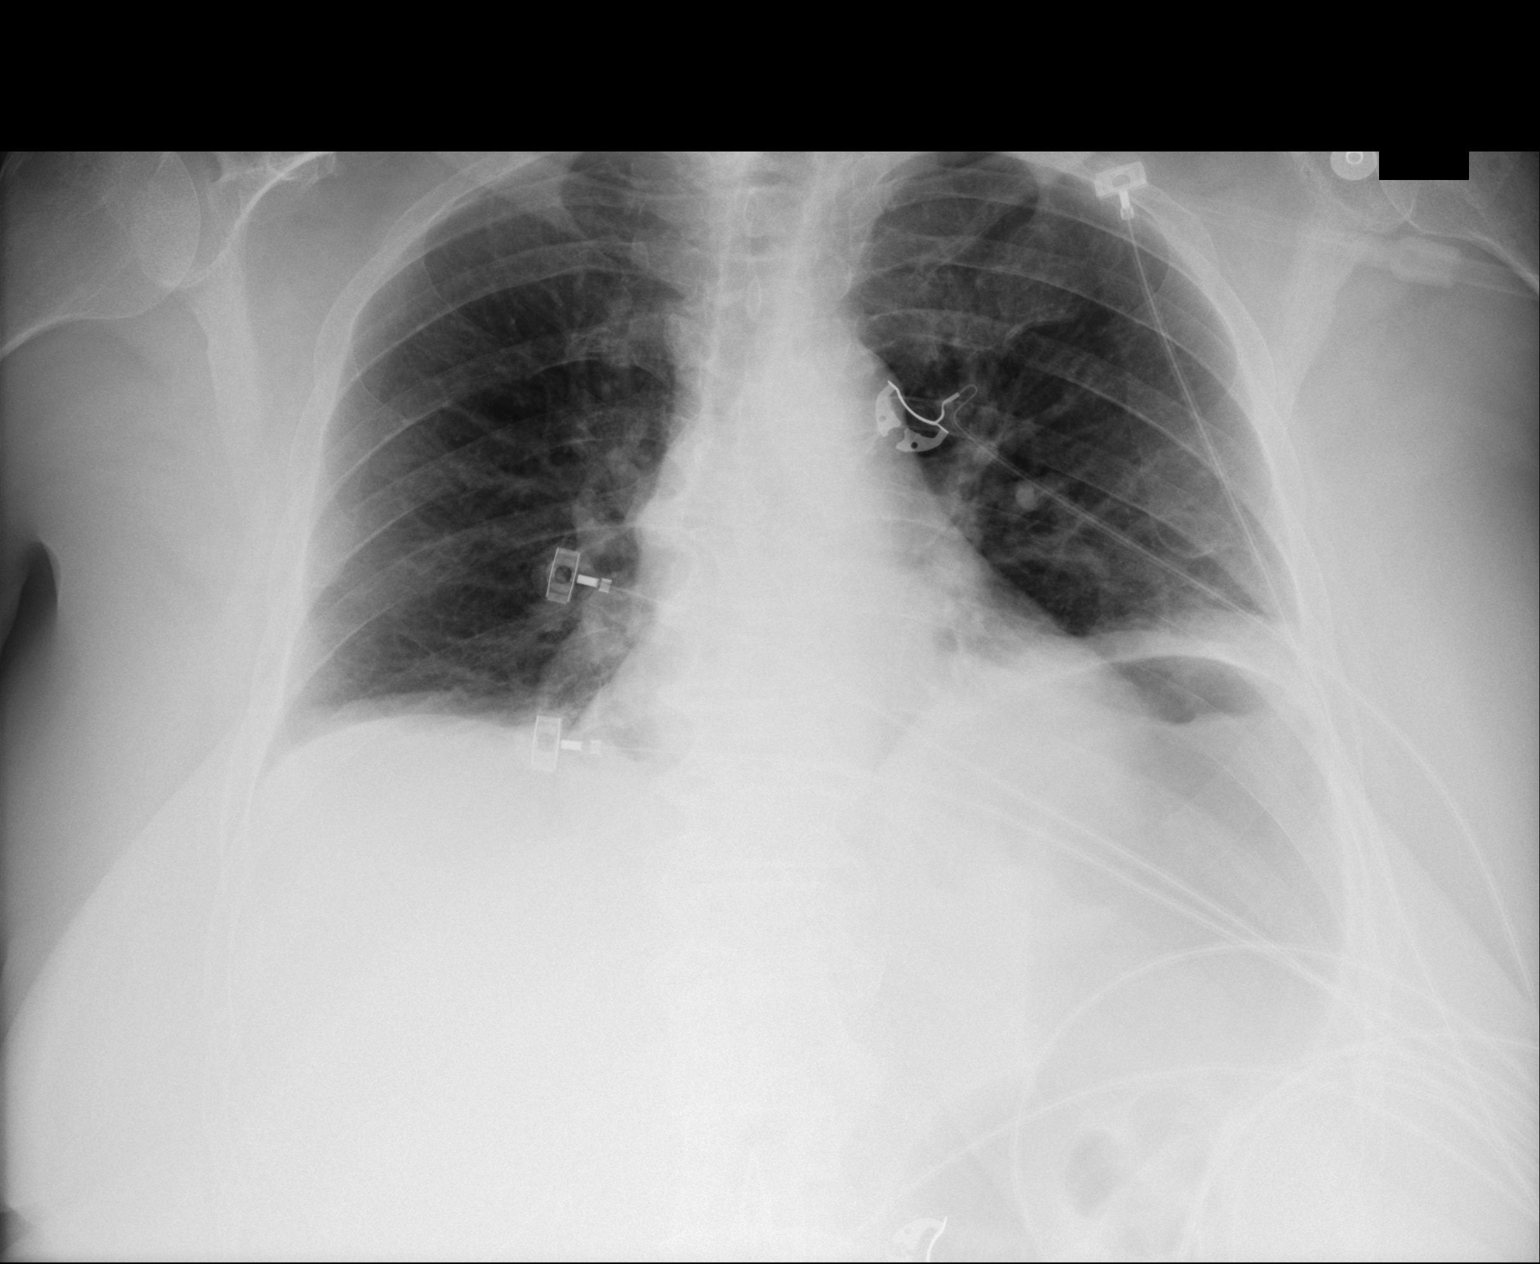

[1 of 1 positions shown; findings below may reference images not displayed]

FINDINGS: Upper normal heart size.

Mediastinal contours and pulmonary vascularity normal.

Bibasilar atelectasis with mild elevation of LEFT diaphragm.

No pleural effusion or pneumothorax.
IMPRESSION: Decreased lung volumes with bibasilar atelectasis.
